# Patient Record
Sex: Male | Born: 1955 | Race: Black or African American | Hispanic: No | Marital: Married | Smoking: Former smoker
Health system: Southern US, Community
[De-identification: ages and names within clinical notes are randomized; demographics above are authoritative.]

## PROBLEM LIST (undated history)

## (undated) DIAGNOSIS — T7840XA Allergy, unspecified, initial encounter: Secondary | ICD-10-CM

## (undated) DIAGNOSIS — E119 Type 2 diabetes mellitus without complications: Secondary | ICD-10-CM

## (undated) DIAGNOSIS — K635 Polyp of colon: Secondary | ICD-10-CM

## (undated) DIAGNOSIS — I10 Essential (primary) hypertension: Secondary | ICD-10-CM

## (undated) DIAGNOSIS — M109 Gout, unspecified: Secondary | ICD-10-CM

## (undated) DIAGNOSIS — E785 Hyperlipidemia, unspecified: Secondary | ICD-10-CM

## (undated) HISTORY — DX: Polyp of colon: K63.5

## (undated) HISTORY — DX: Allergy, unspecified, initial encounter: T78.40XA

## (undated) HISTORY — DX: Essential (primary) hypertension: I10

## (undated) HISTORY — DX: Gout, unspecified: M10.9

## (undated) HISTORY — PX: COLONOSCOPY W/ POLYPECTOMY: SHX1380

## (undated) HISTORY — DX: Type 2 diabetes mellitus without complications: E11.9

## (undated) HISTORY — DX: Hyperlipidemia, unspecified: E78.5

## (undated) HISTORY — PX: NO PAST SURGERIES: SHX2092

---

## 2016-12-16 LAB — HM HIV SCREENING LAB: HM HIV Screening: NEGATIVE

## 2018-02-03 LAB — HM COLONOSCOPY

## 2018-08-26 LAB — HEPATIC FUNCTION PANEL
ALT: 78 — AB (ref 10–40)
AST: 108 — AB (ref 14–40)
Alkaline Phosphatase: 118 (ref 25–125)
Bilirubin, Total: 7.8

## 2018-08-26 LAB — BASIC METABOLIC PANEL
BUN: 11 (ref 4–21)
Creatinine: 0.8 (ref 0.6–1.3)
Glucose: 119
Potassium: 4.5 (ref 3.4–5.3)
Sodium: 140 (ref 137–147)

## 2018-08-26 LAB — CBC AND DIFFERENTIAL
HCT: 39 — AB (ref 41–53)
Hemoglobin: 12.7 — AB (ref 13.5–17.5)
Platelets: 230 (ref 150–399)
WBC: 6.8

## 2018-09-21 LAB — LIPID PANEL
Cholesterol: 184 (ref 0–200)
HDL: 33 — AB (ref 35–70)
LDL Cholesterol: 121
LDl/HDL Ratio: 3.7
Triglycerides: 148 (ref 40–160)

## 2018-09-21 LAB — HEPATIC FUNCTION PANEL
ALT: 57 — AB (ref 10–40)
AST: 123 — AB (ref 14–40)
Alkaline Phosphatase: 100 (ref 25–125)
Bilirubin, Total: 0.5

## 2018-09-21 LAB — HEMOGLOBIN A1C: Hemoglobin A1C: 5.6

## 2018-09-21 LAB — BASIC METABOLIC PANEL
BUN: 8 (ref 4–21)
Creatinine: 0.9 (ref 0.6–1.3)
Glucose: 132
Potassium: 4.2 (ref 3.4–5.3)
Sodium: 143 (ref 137–147)

## 2018-09-21 LAB — HM HEPATITIS C SCREENING LAB: HM Hepatitis Screen: NEGATIVE

## 2019-02-09 ENCOUNTER — Other Ambulatory Visit: Payer: Self-pay

## 2019-02-09 ENCOUNTER — Encounter: Payer: Self-pay | Admitting: Family Medicine

## 2019-02-09 ENCOUNTER — Ambulatory Visit (INDEPENDENT_AMBULATORY_CARE_PROVIDER_SITE_OTHER): Payer: Medicare HMO | Admitting: Family Medicine

## 2019-02-09 ENCOUNTER — Telehealth: Payer: Self-pay | Admitting: Family Medicine

## 2019-02-09 VITALS — BP 108/76 | HR 98 | Temp 98.3°F | Resp 18 | Ht 70.5 in | Wt 232.8 lb

## 2019-02-09 DIAGNOSIS — I10 Essential (primary) hypertension: Secondary | ICD-10-CM

## 2019-02-09 DIAGNOSIS — M1A0790 Idiopathic chronic gout, unspecified ankle and foot, without tophus (tophi): Secondary | ICD-10-CM | POA: Diagnosis not present

## 2019-02-09 DIAGNOSIS — Z9109 Other allergy status, other than to drugs and biological substances: Secondary | ICD-10-CM

## 2019-02-09 DIAGNOSIS — L602 Onychogryphosis: Secondary | ICD-10-CM | POA: Diagnosis not present

## 2019-02-09 DIAGNOSIS — M109 Gout, unspecified: Secondary | ICD-10-CM | POA: Insufficient documentation

## 2019-02-09 DIAGNOSIS — E785 Hyperlipidemia, unspecified: Secondary | ICD-10-CM | POA: Diagnosis not present

## 2019-02-09 DIAGNOSIS — K635 Polyp of colon: Secondary | ICD-10-CM | POA: Diagnosis not present

## 2019-02-09 DIAGNOSIS — R7303 Prediabetes: Secondary | ICD-10-CM | POA: Diagnosis not present

## 2019-02-09 DIAGNOSIS — E1169 Type 2 diabetes mellitus with other specified complication: Secondary | ICD-10-CM | POA: Diagnosis not present

## 2019-02-09 DIAGNOSIS — K219 Gastro-esophageal reflux disease without esophagitis: Secondary | ICD-10-CM | POA: Insufficient documentation

## 2019-02-09 NOTE — Telephone Encounter (Signed)
Patient was seen today. [employer] will be sending a fax about transferring his medications.  Patient said he needs to change from Col[place]icine.  Patient said it would cost him $382 per month.

## 2019-02-09 NOTE — Assessment & Plan Note (Signed)
Stable, refill

## 2019-02-09 NOTE — Telephone Encounter (Signed)
Given that he has been stable x 2 years w/o a flare, would recommend trial of stopping the colchicine -- not meant to be a long term medication

## 2019-02-09 NOTE — Assessment & Plan Note (Signed)
BP at goal. Cont current medication

## 2019-02-09 NOTE — Assessment & Plan Note (Addendum)
Has been a few years since last flare. Rheumatology just cut back colchicine and celecoxib. Now colchicine is very expense. Given duration of time w/o flare, likely does not need continued prophylaxis. Will stop colchicine and monitor for flare. Uric acid per care everywhere has been normal

## 2019-02-09 NOTE — Assessment & Plan Note (Signed)
Doing well on metformin. Will f/u labs

## 2019-02-09 NOTE — Progress Notes (Signed)
Subjective:     Tyler Cubit is a 63 y.o. male presenting for Establish Care (previous PCP was [facility] in [place]) and Medication Refill     HPI  #Toe issue - has long toenails and difficulty cutting them on his own - worried about diabetes - would like to have podiatry referral  #Hx of colon polyps - told to f/u with GI in 12-18 months - last appointment was summer 2019  #HLD - severe side effects to statin - cannot tolerate  #HTN - taking medication w/o issues  #Gout  - recently decreased colchicine and celecoxib - taking allopurinol - sees rheumatology - but will need an in-network provider   Review of Systems  Constitutional: Negative for chills and fever.  Respiratory: Negative for shortness of breath.   Cardiovascular: Negative for chest pain.  Gastrointestinal: Negative for blood in stool.     Social History   Tobacco Use  Smoking Status Former Smoker  . Packs/day: 0.50  . Years: 25.00  . Pack years: 12.50  . Types: Cigarettes  . Quit date: 71  . Years since quitting: 30.5  Smokeless Tobacco Never Used        Objective:    BP Readings from Last 3 Encounters:  02/09/19 108/76   Wt Readings from Last 3 Encounters:  02/09/19 232 lb 12 oz (105.6 kg)    BP 108/76   Pulse 98   Temp 98.3 F (36.8 C)   Resp 18   Ht 5' 10.5" (1.791 m)   Wt 232 lb 12 oz (105.6 kg)   SpO2 99%   BMI 32.92 kg/m    Physical Exam Constitutional:      Appearance: Normal appearance. He is not ill-appearing or diaphoretic.  HENT:     Right Ear: External ear normal.     Left Ear: External ear normal.     Nose: Nose normal.  Eyes:     General: No scleral icterus.    Extraocular Movements: Extraocular movements intact.     Conjunctiva/sclera: Conjunctivae normal.  Neck:     Musculoskeletal: Neck supple.  Cardiovascular:     Rate and Rhythm: Normal rate and regular rhythm.     Heart sounds: No murmur.  Pulmonary:     Effort:  Pulmonary effort is normal. No respiratory distress.     Breath sounds: Normal breath sounds. No wheezing.  Skin:    General: Skin is warm and dry.  Neurological:     Mental Status: He is alert. Mental status is at baseline.  Psychiatric:        Mood and Affect: Mood normal.           Assessment & Plan:   Problem List Items Addressed This Visit      Cardiovascular and Mediastinum   Essential hypertension - Primary    BP at goal. Cont current medication      Relevant Medications   amLODipine (NORVASC) 10 MG tablet   lisinopril (ZESTRIL) 40 MG tablet     Digestive   GERD (gastroesophageal reflux disease)    Stable, refill       Relevant Medications   omeprazole (PRILOSEC) 20 MG capsule     Endocrine   Hyperlipidemia associated with type 2 diabetes mellitus (HCC)   Relevant Medications   metFORMIN (GLUCOPHAGE) 500 MG tablet   lisinopril (ZESTRIL) 40 MG tablet     Other   Prediabetes    Doing well on metformin. Will f/u labs  Gout    Has been a few years since last flare. Rheumatology just cut back colchicine and celecoxib. Now colchicine is very expense. Given duration of time w/o flare, likely does not need continued prophylaxis. Will stop colchicine and monitor for flare. Uric acid per care everywhere has been normal      Environmental allergies    Other Visit Diagnoses    Polyp of colon, unspecified part of colon, unspecified type       Relevant Orders   Ambulatory referral to Gastroenterology   Overgrown toenails       Relevant Orders   Ambulatory referral to Podiatry       Return in about 6 months (around 08/12/2019).  Lesleigh Noe, MD

## 2019-02-09 NOTE — Assessment & Plan Note (Signed)
Cannot tolerate statin. Will encourage diet/exercise

## 2019-02-09 NOTE — Telephone Encounter (Signed)
Awaiting information from [facility]. Sending request for change of medication to Dr. Einar Pheasant to review

## 2019-02-10 NOTE — Telephone Encounter (Signed)
Patient advised. Patient will call back if he starts to having any issues.

## 2019-02-17 ENCOUNTER — Other Ambulatory Visit: Payer: Self-pay | Admitting: Family Medicine

## 2019-02-17 ENCOUNTER — Telehealth: Payer: Self-pay | Admitting: Family Medicine

## 2019-02-17 MED ORDER — OMEPRAZOLE 20 MG PO CPDR: 20.0000 mg | DELAYED_RELEASE_CAPSULE | Freq: Every day | ORAL | 1 refills | Status: DC

## 2019-02-17 MED ORDER — LISINOPRIL 40 MG PO TABS: 40.0000 mg | ORAL_TABLET | Freq: Every day | ORAL | 1 refills | Status: DC

## 2019-02-17 MED ORDER — ALLOPURINOL 100 MG PO TABS: 100.0000 mg | ORAL_TABLET | Freq: Every day | ORAL | 1 refills | Status: DC

## 2019-02-17 MED ORDER — METFORMIN HCL 500 MG PO TABS: 500.0000 mg | ORAL_TABLET | Freq: Two times a day (BID) | ORAL | 1 refills | Status: DC

## 2019-02-17 MED ORDER — AMLODIPINE BESYLATE 10 MG PO TABS: 10.0000 mg | ORAL_TABLET | Freq: Every day | ORAL | 1 refills | Status: DC

## 2019-02-17 NOTE — Telephone Encounter (Signed)
RXs sent in except for Celebrex, will let Dr. Einar Pheasant review.

## 2019-02-17 NOTE — Telephone Encounter (Signed)
Pt is requesting all meds on list (these were confirmed) sent to [facility] mail order pharmacy.

## 2019-02-17 NOTE — Telephone Encounter (Signed)
Pt called to let Rosaria Ferries know that he was able to call and get colonoscopy scheduled for 8/6.

## 2019-02-18 ENCOUNTER — Other Ambulatory Visit: Payer: Self-pay

## 2019-02-18 ENCOUNTER — Encounter: Payer: Self-pay | Admitting: Podiatry

## 2019-02-18 ENCOUNTER — Ambulatory Visit: Payer: Medicare HMO | Admitting: Podiatry

## 2019-02-18 DIAGNOSIS — M79674 Pain in right toe(s): Secondary | ICD-10-CM | POA: Diagnosis not present

## 2019-02-18 DIAGNOSIS — M79675 Pain in left toe(s): Secondary | ICD-10-CM

## 2019-02-18 DIAGNOSIS — B351 Tinea unguium: Secondary | ICD-10-CM | POA: Diagnosis not present

## 2019-02-18 MED ORDER — CELECOXIB 200 MG PO CAPS: 200.0000 mg | ORAL_CAPSULE | Freq: Every day | ORAL | 1 refills | Status: DC

## 2019-02-18 NOTE — Progress Notes (Signed)
Complaint:  Visit Type: Patient presents  to my office for  preventative foot care services. Complaint: Patient states" my nails have grown long and thick and become painful to walk and wear shoes" Patient has been diagnosed prediabetic. The patient presents for preventative foot care services. No changes to ROS  Podiatric Exam: Vascular: dorsalis pedis and posterior tibial pulses are palpable bilateral. Capillary return is immediate. Temperature gradient is WNL. Skin turgor WNL  Sensorium: Normal Semmes Weinstein monofilament test. Normal tactile sensation bilaterally. Nail Exam: Pt has thick disfigured discolored nails with subungual debris noted bilateral entire nail hallux through fifth toenails Ulcer Exam: There is no evidence of ulcer or pre-ulcerative changes or infection. Orthopedic Exam: Muscle tone and strength are WNL. No limitations in general ROM. No crepitus or effusions noted. Foot type and digits show no abnormalities. Bony prominences are unremarkable. Skin: No Porokeratosis. No infection or ulcers  Diagnosis:  Onychomycosis, , Pain in right toe, pain in left toes  Treatment & Plan Procedures and Treatment: Consent by patient was obtained for treatment procedures.   Debridement of mycotic and hypertrophic toenails, 1 through 5 bilateral and clearing of subungual debris. No ulceration, no infection noted.  Return Visit-Office Procedure: Patient instructed to return to the office for a follow up visit 3 months for continued evaluation and treatment.    Gardiner Barefoot DPM

## 2019-02-24 DIAGNOSIS — E119 Type 2 diabetes mellitus without complications: Secondary | ICD-10-CM | POA: Diagnosis not present

## 2019-02-24 DIAGNOSIS — M1000 Idiopathic gout, unspecified site: Secondary | ICD-10-CM | POA: Diagnosis not present

## 2019-02-24 DIAGNOSIS — R945 Abnormal results of liver function studies: Secondary | ICD-10-CM | POA: Diagnosis not present

## 2019-02-24 DIAGNOSIS — I10 Essential (primary) hypertension: Secondary | ICD-10-CM | POA: Diagnosis not present

## 2019-03-08 DIAGNOSIS — Z01812 Encounter for preprocedural laboratory examination: Secondary | ICD-10-CM | POA: Diagnosis not present

## 2019-03-08 DIAGNOSIS — Z1159 Encounter for screening for other viral diseases: Secondary | ICD-10-CM | POA: Diagnosis not present

## 2019-03-08 DIAGNOSIS — R945 Abnormal results of liver function studies: Secondary | ICD-10-CM | POA: Diagnosis not present

## 2019-03-08 DIAGNOSIS — M1000 Idiopathic gout, unspecified site: Secondary | ICD-10-CM | POA: Diagnosis not present

## 2019-03-11 DIAGNOSIS — Z86010 Personal history of colonic polyps: Secondary | ICD-10-CM | POA: Diagnosis not present

## 2019-03-11 DIAGNOSIS — Z7984 Long term (current) use of oral hypoglycemic drugs: Secondary | ICD-10-CM | POA: Diagnosis not present

## 2019-03-11 DIAGNOSIS — Z79899 Other long term (current) drug therapy: Secondary | ICD-10-CM | POA: Diagnosis not present

## 2019-03-11 DIAGNOSIS — I10 Essential (primary) hypertension: Secondary | ICD-10-CM | POA: Diagnosis not present

## 2019-03-11 DIAGNOSIS — K635 Polyp of colon: Secondary | ICD-10-CM | POA: Diagnosis not present

## 2019-03-11 DIAGNOSIS — K640 First degree hemorrhoids: Secondary | ICD-10-CM | POA: Diagnosis not present

## 2019-03-11 DIAGNOSIS — R069 Unspecified abnormalities of breathing: Secondary | ICD-10-CM | POA: Diagnosis not present

## 2019-03-11 DIAGNOSIS — D125 Benign neoplasm of sigmoid colon: Secondary | ICD-10-CM | POA: Diagnosis not present

## 2019-03-11 DIAGNOSIS — Z9889 Other specified postprocedural states: Secondary | ICD-10-CM | POA: Diagnosis not present

## 2019-03-11 DIAGNOSIS — E119 Type 2 diabetes mellitus without complications: Secondary | ICD-10-CM | POA: Diagnosis not present

## 2019-03-11 DIAGNOSIS — Z1211 Encounter for screening for malignant neoplasm of colon: Secondary | ICD-10-CM | POA: Diagnosis not present

## 2019-03-11 DIAGNOSIS — K648 Other hemorrhoids: Secondary | ICD-10-CM | POA: Diagnosis not present

## 2019-04-23 ENCOUNTER — Telehealth: Payer: Self-pay

## 2019-04-23 MED ORDER — CETIRIZINE HCL 10 MG PO TABS: 10.0000 mg | ORAL_TABLET | Freq: Every day | ORAL | 1 refills | Status: DC

## 2019-04-23 NOTE — Telephone Encounter (Signed)
Pt established care with Dr Einar Pheasant on 02/09/19 and pt said [employer] does not carry Cetirizine. Pt request 90 day supply Cetirizine taking one daily to [facility]. Pt said OTC med is $10.00 more a month than rx med. Refilled cetirizine to [facility] per protocol and pt will cb to schedule 6 mth fu. Anastasiya CMA said OK to refill by Dr Einar Pheasant.

## 2019-05-04 ENCOUNTER — Other Ambulatory Visit: Payer: Self-pay

## 2019-05-04 ENCOUNTER — Encounter (HOSPITAL_COMMUNITY): Payer: Self-pay | Admitting: Emergency Medicine

## 2019-05-04 ENCOUNTER — Telehealth: Payer: Self-pay

## 2019-05-04 ENCOUNTER — Emergency Department (HOSPITAL_COMMUNITY)
Admission: EM | Admit: 2019-05-04 | Discharge: 2019-05-04 | Disposition: A | Discharge status: Home / Self Care | Payer: Medicare HMO | Attending: Emergency Medicine | Admitting: Emergency Medicine

## 2019-05-04 DIAGNOSIS — E119 Type 2 diabetes mellitus without complications: Secondary | ICD-10-CM | POA: Diagnosis not present

## 2019-05-04 DIAGNOSIS — Z7984 Long term (current) use of oral hypoglycemic drugs: Secondary | ICD-10-CM | POA: Insufficient documentation

## 2019-05-04 DIAGNOSIS — Z87891 Personal history of nicotine dependence: Secondary | ICD-10-CM | POA: Insufficient documentation

## 2019-05-04 DIAGNOSIS — I10 Essential (primary) hypertension: Secondary | ICD-10-CM | POA: Diagnosis not present

## 2019-05-04 DIAGNOSIS — K148 Other diseases of tongue: Secondary | ICD-10-CM | POA: Diagnosis present

## 2019-05-04 DIAGNOSIS — T783XXA Angioneurotic edema, initial encounter: Secondary | ICD-10-CM | POA: Insufficient documentation

## 2019-05-04 DIAGNOSIS — Z79899 Other long term (current) drug therapy: Secondary | ICD-10-CM | POA: Diagnosis not present

## 2019-05-04 LAB — CBC WITH DIFFERENTIAL/PLATELET
Abs Immature Granulocytes: 0.02 10*3/uL (ref 0.00–0.07)
Basophils Absolute: 0 10*3/uL (ref 0.0–0.1)
Basophils Relative: 0 %
Eosinophils Absolute: 0.1 10*3/uL (ref 0.0–0.5)
Eosinophils Relative: 1 %
HCT: 37.1 % — ABNORMAL LOW (ref 39.0–52.0)
Hemoglobin: 12.4 g/dL — ABNORMAL LOW (ref 13.0–17.0)
Immature Granulocytes: 0 %
Lymphocytes Relative: 23 %
Lymphs Abs: 1.3 10*3/uL (ref 0.7–4.0)
MCH: 30.8 pg (ref 26.0–34.0)
MCHC: 33.4 g/dL (ref 30.0–36.0)
MCV: 92.1 fL (ref 80.0–100.0)
Monocytes Absolute: 0.5 10*3/uL (ref 0.1–1.0)
Monocytes Relative: 8 %
Neutro Abs: 3.7 10*3/uL (ref 1.7–7.7)
Neutrophils Relative %: 68 %
Platelets: 212 10*3/uL (ref 150–400)
RBC: 4.03 MIL/uL — ABNORMAL LOW (ref 4.22–5.81)
RDW: 14.5 % (ref 11.5–15.5)
WBC: 5.6 10*3/uL (ref 4.0–10.5)
nRBC: 0 % (ref 0.0–0.2)

## 2019-05-04 LAB — BASIC METABOLIC PANEL
Anion gap: 10 (ref 5–15)
BUN: 9 mg/dL (ref 8–23)
CO2: 24 mmol/L (ref 22–32)
Calcium: 9.4 mg/dL (ref 8.9–10.3)
Chloride: 103 mmol/L (ref 98–111)
Creatinine, Ser: 0.92 mg/dL (ref 0.61–1.24)
GFR calc Af Amer: >60 mL/min (ref >60)
GFR calc non Af Amer: >60 mL/min (ref >60)
Glucose, Bld: 179 mg/dL — ABNORMAL HIGH (ref 70–99)
Potassium: 4.1 mmol/L (ref 3.5–5.1)
Sodium: 137 mmol/L (ref 135–145)

## 2019-05-04 MED ORDER — METHYLPREDNISOLONE SODIUM SUCC 125 MG IJ SOLR
125.0000 mg | Freq: Once | INTRAMUSCULAR | Status: AC
  Administered 2019-05-04: 125 mg via INTRAVENOUS
  Filled 2019-05-04: qty 2

## 2019-05-04 NOTE — ED Provider Notes (Signed)
[facility] EMERGENCY DEPARTMENT Provider Note   CSN: [id number] Arrival date & time: 05/04/19  [id number]     History   Chief Complaint Chief Complaint  Patient presents with  . Angioedema    HPI Jeremy Andrews is a 63 y.o. male.     HPI  Pt is a 63 y/o male with a h/o DM, gout, HTN, who presents to the ED today for eval of tongue swelling. States he woke up this morning around 7:30AM with right sided tongue swelling. He has had some progression to the left side of his tongue. He feels like it is hard to swallow but he is able to tolerate his secretions.   He denies rashes, sob, wheezing or other symptoms. He denies any new meds, foods, etc.   He is on lisinopril and has been for years.    Past Medical History:  Diagnosis Date  . Allergy   . Colon polyps   . Diabetes mellitus without complication ([place])   . Gout   . Hypertension     Patient Active Problem List   Diagnosis Date Noted  . Pain due to onychomycosis of toenails of both feet 02/18/2019  . Essential hypertension 02/09/2019  . Prediabetes 02/09/2019  . Hyperlipidemia associated with type 2 diabetes mellitus ([place]) 02/09/2019  . GERD (gastroesophageal reflux disease) 02/09/2019  . Gout 02/09/2019  . Environmental allergies 02/09/2019    Past Surgical History:  Procedure Laterality Date  . NO PAST SURGERIES          Home Medications    Prior to Admission medications   Medication Sig Start Date End Date Taking? Authorizing Provider  allopurinol (ZYLOPRIM) 100 MG tablet Take 1 tablet (100 mg total) by mouth daily. 02/17/19  Yes Lesleigh Noe, MD  amLODipine (NORVASC) 10 MG tablet Take 1 tablet (10 mg total) by mouth daily. 02/17/19  Yes Lesleigh Noe, MD  celecoxib (CELEBREX) 200 MG capsule Take 1 capsule (200 mg total) by mouth daily. 02/18/19  Yes Lesleigh Noe, MD  cetirizine (ZYRTEC) 10 MG tablet Take 1 tablet (10 mg total) by mouth daily. 04/23/19  Yes Lesleigh Noe, MD   lisinopril (ZESTRIL) 40 MG tablet Take 1 tablet (40 mg total) by mouth daily. 02/17/19  Yes Lesleigh Noe, MD  metFORMIN (GLUCOPHAGE) 500 MG tablet Take 1 tablet (500 mg total) by mouth 2 (two) times daily with a meal. 02/17/19  Yes Lesleigh Noe, MD  omeprazole (PRILOSEC) 20 MG capsule Take 1 capsule (20 mg total) by mouth daily. 02/17/19  Yes Lesleigh Noe, MD    Family History Family History  Problem Relation Age of Onset  . Hypertension Mother   . Gout Mother   . Other Mother        meningioma  . Hypertension Father   . Kidney disease Sister   . Hypertension Sister   . Kidney failure Sister   . Other Sister        legally blind  . Diabetes Sister   . Arthritis Brother   . Gout Brother   . Breast cancer Sister   . Breast cancer Maternal Grandmother 57    Social History Social History   Tobacco Use  . Smoking status: Former Smoker    Packs/day: 0.50    Years: 25.00    Pack years: 12.50    Types: Cigarettes    Quit date: 1990    Years since quitting: 30.7  . Smokeless tobacco: Never Used  Substance Use Topics  . Alcohol use: Yes    Comment: drinks 2-3 days a week, will drink 2-4 each day  . Drug use: Not Currently    Types: Marijuana    Comment: last time around 2000 or even before then     Allergies   Patient has no known allergies.   Review of Systems Review of Systems  Constitutional: Negative for chills and fever.  HENT: Positive for facial swelling. Negative for ear pain and sore throat.   Eyes: Negative for visual disturbance.  Respiratory: Negative for cough and shortness of breath.   Cardiovascular: Negative for chest pain and palpitations.  Gastrointestinal: Negative for abdominal pain, constipation, diarrhea, nausea and vomiting.  Genitourinary: Negative for dysuria and hematuria.  Musculoskeletal: Negative for back pain.  Skin: Negative for rash.  Neurological: Negative for headaches.  All other systems reviewed and are negative.     Physical Exam Updated Vital Signs BP 122/75   Pulse 85   Temp 98.4 F (36.9 C) (Oral)   Resp 18   SpO2 99%   Physical Exam Vitals signs and nursing note reviewed.  Constitutional:      General: He is not in acute distress.    Appearance: He is well-developed. He is not ill-appearing.  HENT:     Head: Normocephalic and atraumatic.     Mouth/Throat:     Mouth: Mucous membranes are moist.     Comments: Swelling to the tongue. No lip swelling. Speech somewhat garbled. Tolerating secretions.  Eyes:     Conjunctiva/sclera: Conjunctivae normal.  Neck:     Musculoskeletal: Neck supple.  Cardiovascular:     Rate and Rhythm: Normal rate and regular rhythm.     Heart sounds: Normal heart sounds. No murmur.  Pulmonary:     Effort: Pulmonary effort is normal. No respiratory distress.     Breath sounds: Normal breath sounds. No wheezing, rhonchi or rales.  Abdominal:     Palpations: Abdomen is soft.     Tenderness: There is no abdominal tenderness.  Skin:    General: Skin is warm and dry.  Neurological:     Mental Status: He is alert.      ED Treatments / Results  Labs (all labs ordered are listed, but only abnormal results are displayed) Labs Reviewed  CBC WITH DIFFERENTIAL/PLATELET - Abnormal; Notable for the following components:      Result Value   RBC 4.03 (*)    Hemoglobin 12.4 (*)    HCT 37.1 (*)    All other components within normal limits  BASIC METABOLIC PANEL - Abnormal; Notable for the following components:   Glucose, Bld 179 (*)    All other components within normal limits    EKG None  Radiology No results found.  Procedures Procedures (including critical care time)  Medications Ordered in ED Medications  methylPREDNISolone sodium succinate (SOLU-MEDROL) 125 mg/2 mL injection 125 mg (125 mg Intravenous Given 05/04/19 1018)     Initial Impression / Assessment and Plan / ED Course  I have reviewed the triage vital signs and the nursing notes.   Pertinent labs & imaging results that were available during my care of the patient were reviewed by me and considered in my medical decision making (see chart for details).   Final Clinical Impressions(s) / ED Diagnoses   Final diagnoses:  Angioedema, initial encounter   Pt is a 63 y/o male presenting for eval of angioedema that started when he woke up this AM around  7:30AM. He is on lisinopril. Denies sob, wheezing, hives. No new meds, foods, environmental changes.  On exam, he has diffuse tongue swelling. No lip swelling. Speech somewhat garbled. He is tolerating secretions. No wheezing on exam. No rashes.  Suspect angioedema 2/2 lisinopril. Lower suspicion for anaphylaxis. Will give solumedrol, obtain basic labs and monitor closely.  - labs reviewed and at baseline for pt.   10:39 AM Reassessed pt. He is sitting comfortably in bed. He states he has had some improvement of sxs. He states that the right side of his tongue feels less swollen and he is swallowing and talking better than when he first arrived to the ED. He does feel like the left side of his tongue is more swollen thought. On exam he still exhibits diffuse tongue swelling. No lip swelling. He tolerating his secretions.   11:40 AM Reassessed pt. He is sitting comfortably in bed. He reports no change in tongue swelling. He does feel like it is easier to swallow. His speech is more clear than prior.   12:45 PM Pt still appears well. He "feels a whole lot better". He does still have swelling of his tongue. Airway patent.   1:39 PM Pt feels better. Voice is improving. Tongue swelling remains stable.   3:13 PM Pt improving. He has remained stable. Discussed plan for discharge and plan to discontinue lisinopril. I suspect sxs secondary to this. Advised him to f/u with pcp in regards to further BP medications recommendations. Advised to return immediately if worse.  He and his wife at bedside voiced understanding of the plan and are in  agreement.  They are comfortable with this plan.  All questions answered.  Patient stable for discharge.  Pt seen in conjunction with Dr. Alvino Chapel who personally evaluated pt and is in agreement with plan.   ED Discharge Orders    None       Bishop Dublin 05/04/19 1516    Davonna Belling, MD 05/04/19 [phone]

## 2019-05-04 NOTE — Discharge Instructions (Signed)
Stop taking your lisinopril.  Call your regular doctor in regards to starting a new blood pressure medication to replace this.  Please make an appointment in the next few days to be seen.  Return to the emergency department immediately if any of your symptoms progress.

## 2019-05-04 NOTE — ED Notes (Signed)
Pt has swelling in tongue, takes lisinopril and metformin.

## 2019-05-04 NOTE — ED Triage Notes (Signed)
Pt states he woke up with his right side of his tongue very swollen with difficulty swallowing. Pt denies any trouble breathing at this time and can speak in compete sentences however it is somewhat garbled due to swelling.   Charge made aware pt needs room stat.

## 2019-05-04 NOTE — ED Notes (Signed)
Patient Alert and oriented to baseline. Stable and ambulatory to baseline. Patient verbalized understanding of the discharge instructions.  Patient belongings were taken by the patient.   

## 2019-05-04 NOTE — Telephone Encounter (Signed)
Noted.. agree with ER visit.

## 2019-05-04 NOTE — Telephone Encounter (Addendum)
Pt has swollen tongue and difficulty swallowing that started during the night; pt is not having difficulty breathing and cannot tell if throat is swollen or not due to difficulty swallowing. Throat is not sore. Pt has had nothing new, no new foods,drinks, clothing, furniture,meds etc. Pt speech is somewhat difficult now. Pt does not want 911 called and pt has someone who can take him to [facility] ED now.DR Einar Pheasant pt will FYI Dr Heather Roberts chart review pt is at [facility] ED)

## 2019-05-05 ENCOUNTER — Other Ambulatory Visit: Payer: Self-pay

## 2019-05-05 ENCOUNTER — Ambulatory Visit (INDEPENDENT_AMBULATORY_CARE_PROVIDER_SITE_OTHER): Payer: Medicare HMO | Admitting: Internal Medicine

## 2019-05-05 ENCOUNTER — Encounter: Payer: Self-pay | Admitting: Internal Medicine

## 2019-05-05 VITALS — BP 142/78 | HR 111 | Temp 97.9°F | Wt 242.0 lb

## 2019-05-05 DIAGNOSIS — T783XXD Angioneurotic edema, subsequent encounter: Secondary | ICD-10-CM

## 2019-05-05 DIAGNOSIS — I10 Essential (primary) hypertension: Secondary | ICD-10-CM | POA: Diagnosis not present

## 2019-05-05 MED ORDER — LABETALOL HCL 100 MG PO TABS: 50.0000 mg | ORAL_TABLET | Freq: Two times a day (BID) | ORAL | 0 refills | Status: DC

## 2019-05-05 NOTE — Patient Instructions (Signed)
Angioedema  Angioedema is sudden swelling in the body. The swelling can happen in any part of the body. It often happens on the skin and causes itchy, bumpy patches (hives) to form. This condition may:  Happen only one time.  Happen more than one time. It may come back at random times.  Keep coming back for a number of years. Someday it may stop coming back. Follow these instructions at home:  Take over-the-counter and prescription medicines only as told by your doctor.  If you were given medicines for emergency allergy treatment, always carry them with you.  Wear a medical bracelet as told by your doctor.  Avoid the things that cause your attacks (triggers).  If this condition was passed to you from your parents and you want to have kids, talk to your doctor. Your kids may also have this condition. Contact a doctor if:  You have another attack.  Your attacks happen more often, even after you take steps to prevent them.  This condition was passed to you by your parents and you want to have kids. Get help right away if:  Your mouth, tongue, or lips get very swollen.  You have trouble breathing.  You have trouble swallowing.  You pass out (faint). This information is not intended to replace advice given to you by your health care provider. Make sure you discuss any questions you have with your health care provider. Document Released: 07/10/2009 Document Revised: 07/04/2017 Document Reviewed: 01/30/2016 Elsevier Patient Education  2020 [employer].

## 2019-05-05 NOTE — Progress Notes (Signed)
Subjective:    Patient ID: Jeremy Andrews, male    DOB: 08/22/1955, 63 y.o.   MRN: [id number]  HPI  Pt presents to the clinic today for ER follow up. He went to the ER 9/29 with c/o tongue swelling. ECG did not show any acute findings. Labs were unremarkable. He was given Solumedrol 125 mg. He was diagnosed with angioedema secondary to Lisinopril. They advised him not to take this any longer. He was discharged home and advised to follow up with his PCP. Since discharge, he reports swelling of the tongue has almost completely resolved. He has not taken any Lisinopril but has been taking his Amlodipine. His BP today is 142/78.  Review of Systems  Past Medical History:  Diagnosis Date  . Allergy   . Colon polyps   . Diabetes mellitus without complication ([place])   . Gout   . Hypertension     Current Outpatient Medications  Medication Sig Dispense Refill  . allopurinol (ZYLOPRIM) 100 MG tablet Take 1 tablet (100 mg total) by mouth daily. 90 tablet 1  . amLODipine (NORVASC) 10 MG tablet Take 1 tablet (10 mg total) by mouth daily. 90 tablet 1  . celecoxib (CELEBREX) 200 MG capsule Take 1 capsule (200 mg total) by mouth daily. 90 capsule 1  . cetirizine (ZYRTEC) 10 MG tablet Take 1 tablet (10 mg total) by mouth daily. 90 tablet 1  . metFORMIN (GLUCOPHAGE) 500 MG tablet Take 1 tablet (500 mg total) by mouth 2 (two) times daily with a meal. 180 tablet 1  . omeprazole (PRILOSEC) 20 MG capsule Take 1 capsule (20 mg total) by mouth daily. 90 capsule 1   No current facility-administered medications for this visit.     Allergies  Allergen Reactions  . Lisinopril Swelling    Family History  Problem Relation Age of Onset  . Hypertension Mother   . Gout Mother   . Other Mother        meningioma  . Hypertension Father   . Kidney disease Sister   . Hypertension Sister   . Kidney failure Sister   . Other Sister        legally blind  . Diabetes Sister   . Arthritis Brother   . Gout Brother    . Breast cancer Sister   . Breast cancer Maternal Grandmother 60    Social History   Socioeconomic History  . Marital status: Legally Separated    Spouse name: Not on file  . Number of children: 1  . Years of education: 2 years of college  . Highest education level: Not on file  Occupational History  . Not on file  Social Needs  . Financial resource strain: Not hard at all  . Food insecurity    Worry: Not on file    Inability: Not on file  . Transportation needs    Medical: Not on file    Non-medical: Not on file  Tobacco Use  . Smoking status: Former Smoker    Packs/day: 0.50    Years: 25.00    Pack years: 12.50    Types: Cigarettes    Quit date: 1990    Years since quitting: 30.7  . Smokeless tobacco: Never Used  Substance and Sexual Activity  . Alcohol use: Yes    Comment: drinks 2-3 days a week, will drink 2-4 each day  . Drug use: Not Currently    Types: Marijuana    Comment: last time around 2000 or even before  then  . Sexual activity: Yes    Birth control/protection: None, Post-menopausal  Lifestyle  . Physical activity    Days per week: Not on file    Minutes per session: Not on file  . Stress: Not on file  Relationships  . Social Herbalist on phone: Not on file    Gets together: Not on file    Attends religious service: Not on file    Active member of club or organization: Not on file    Attends meetings of clubs or organizations: Not on file    Relationship status: Not on file  . Intimate partner violence    Fear of current or ex partner: Not on file    Emotionally abused: Not on file    Physically abused: Not on file    Forced sexual activity: Not on file  Other Topics Concern  . 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  HEENT: Denies eye pain, eye redness, ear pain, ringing in the ears, wax buildup, runny nose, nasal congestion, bloody nose, or sore throat. Respiratory: Denies difficulty breathing, shortness of breath, cough or sputum production.   Cardiovascular: Denies chest pain, chest tightness, palpitations or swelling in the hands or feet.    No other specific complaints in a complete review of systems (except as listed in HPI above).     Objective:   Physical Exam   BP (!) 142/78   Pulse (!) 111   Temp 97.9 F (36.6 C) (Temporal)   Wt 242 lb (109.8 kg)   SpO2 98%   BMI 34.23 kg/m  Wt Readings from Last 3 Encounters:  05/05/19 242 lb (109.8 kg)  02/09/19 232 lb 12 oz (105.6 kg)    General: Appears his stated age, obese, in NAD. HEENT: Throat/Mouth: Teeth present, mucosa pink and moist, no exudate, lesions or ulcerations noted.  Cardiovascular: Tachycardic with normal rhythm. No murmur noted. Pulmonary/Chest: Normal effort and positive vesicular breath sounds. No respiratory distress. No wheezes, rales or ronchi noted.  Neurological: Alert and oriented.    BMET    Component Value Date/Time   NA 137 05/04/2019 1019   NA 143 09/21/2018   K 4.1 05/04/2019 1019   CL 103 05/04/2019 1019   CO2 24 05/04/2019 1019   GLUCOSE 179 (H) 05/04/2019 1019   BUN 9 05/04/2019 1019   BUN 8 09/21/2018   CREATININE 0.92 05/04/2019 1019   CALCIUM 9.4 05/04/2019 1019   GFRNONAA >60 05/04/2019 1019   GFRAA >60 05/04/2019 1019    Lipid Panel     Component Value Date/Time   CHOL 184 09/21/2018   TRIG 148 09/21/2018   HDL 33 (A)  09/21/2018   LDLCALC 121 09/21/2018    CBC  Component Value Date/Time   WBC 5.6 05/04/2019 1019   RBC 4.03 (L) 05/04/2019 1019   HGB 12.4 (L) 05/04/2019 1019   HCT 37.1 (L) 05/04/2019 1019   PLT 212 05/04/2019 1019   MCV 92.1 05/04/2019 1019   MCH 30.8 05/04/2019 1019   MCHC 33.4 05/04/2019 1019   RDW 14.5 05/04/2019 1019   LYMPHSABS 1.3 05/04/2019 1019   MONOABS 0.5 05/04/2019 1019   EOSABS 0.1 05/04/2019 1019   BASOSABS 0.0 05/04/2019 1019    Hgb A1C Lab Results  Component Value Date   HGBA1C 5.6 09/21/2018           Assessment & Plan:   ER Follow up for Angioedema, HTN:  ER notes, labs and imaging reviewed Angioedema resolved Continue Amlodipine Lisinopril d/c'd and added to allergy list Will trial Labetalol 50 mg BID Reinforced DASH diet and exercise for weight loss  RTC in 2 weeks for follow up HTN Webb Silversmith, NP

## 2019-05-19 ENCOUNTER — Ambulatory Visit (INDEPENDENT_AMBULATORY_CARE_PROVIDER_SITE_OTHER): Payer: Medicare HMO | Admitting: Internal Medicine

## 2019-05-19 ENCOUNTER — Other Ambulatory Visit: Payer: Self-pay

## 2019-05-19 ENCOUNTER — Encounter: Payer: Self-pay | Admitting: Internal Medicine

## 2019-05-19 DIAGNOSIS — I10 Essential (primary) hypertension: Secondary | ICD-10-CM | POA: Diagnosis not present

## 2019-05-19 NOTE — Patient Instructions (Signed)
Managing Your Hypertension Hypertension is commonly called high blood pressure. This is when the force of your blood pressing against the walls of your arteries is too strong. Arteries are blood vessels that carry blood from your heart throughout your body. Hypertension forces the heart to work harder to pump blood, and may cause the arteries to become narrow or stiff. Having untreated or uncontrolled hypertension can cause heart attack, stroke, kidney disease, and other problems. What are blood pressure readings? A blood pressure reading consists of a higher number over a lower number. Ideally, your blood pressure should be below 120/80. The first ("top") number is called the systolic pressure. It is a measure of the pressure in your arteries as your heart beats. The second ("bottom") number is called the diastolic pressure. It is a measure of the pressure in your arteries as the heart relaxes. What does my blood pressure reading mean? Blood pressure is classified into four stages. Based on your blood pressure reading, your health care provider may use the following stages to determine what type of treatment you need, if any. Systolic pressure and diastolic pressure are measured in a unit called mm Hg. Normal  Systolic pressure: below 120.  Diastolic pressure: below 80. Elevated  Systolic pressure: 120-129.  Diastolic pressure: below 80. Hypertension stage 1  Systolic pressure: 130-139.  Diastolic pressure: 80-89. Hypertension stage 2  Systolic pressure: 140 or above.  Diastolic pressure: 90 or above. What health risks are associated with hypertension? Managing your hypertension is an important responsibility. Uncontrolled hypertension can lead to:  A heart attack.  A stroke.  A weakened blood vessel (aneurysm).  Heart failure.  Kidney damage.  Eye damage.  Metabolic syndrome.  Memory and concentration problems. What changes can I make to manage my  hypertension? Hypertension can be managed by making lifestyle changes and possibly by taking medicines. Your health care provider will help you make a plan to bring your blood pressure within a normal range. Eating and drinking   Eat a diet that is high in fiber and potassium, and low in salt (sodium), added sugar, and fat. An example eating plan is called the DASH (Dietary Approaches to Stop Hypertension) diet. To eat this way: ? Eat plenty of fresh fruits and vegetables. Try to fill half of your plate at each meal with fruits and vegetables. ? Eat whole grains, such as whole wheat pasta, brown rice, or whole grain bread. Fill about one quarter of your plate with whole grains. ? Eat low-fat diary products. ? Avoid fatty cuts of meat, processed or cured meats, and poultry with skin. Fill about one quarter of your plate with lean proteins such as fish, chicken without skin, beans, eggs, and tofu. ? Avoid premade and processed foods. These tend to be higher in sodium, added sugar, and fat.  Reduce your daily sodium intake. Most people with hypertension should eat less than 1,500 mg of sodium a day.  Limit alcohol intake to no more than 1 drink a day for nonpregnant women and 2 drinks a day for men. One drink equals 12 oz of beer, 5 oz of wine, or 1 oz of hard liquor. Lifestyle  Work with your health care provider to maintain a healthy body weight, or to lose weight. Ask what an ideal weight is for you.  Get at least 30 minutes of exercise that causes your heart to beat faster (aerobic exercise) most days of the week. Activities may include walking, swimming, or biking.  Include exercise   to strengthen your muscles (resistance exercise), such as weight lifting, as part of your weekly exercise routine. Try to do these types of exercises for 30 minutes at least 3 days a week.  Do not use any products that contain nicotine or tobacco, such as cigarettes and e-cigarettes. If you need help quitting,  ask your health care provider.  Control any long-term (chronic) conditions you have, such as high cholesterol or diabetes. Monitoring  Monitor your blood pressure at home as told by your health care provider. Your personal target blood pressure may vary depending on your medical conditions, your age, and other factors.  Have your blood pressure checked regularly, as often as told by your health care provider. Working with your health care provider  Review all the medicines you take with your health care provider because there may be side effects or interactions.  Talk with your health care provider about your diet, exercise habits, and other lifestyle factors that may be contributing to hypertension.  Visit your health care provider regularly. Your health care provider can help you create and adjust your plan for managing hypertension. Will I need medicine to control my blood pressure? Your health care provider may prescribe medicine if lifestyle changes are not enough to get your blood pressure under control, and if:  Your systolic blood pressure is 130 or higher.  Your diastolic blood pressure is 80 or higher. Take medicines only as told by your health care provider. Follow the directions carefully. Blood pressure medicines must be taken as prescribed. The medicine does not work as well when you skip doses. Skipping doses also puts you at risk for problems. Contact a health care provider if:  You think you are having a reaction to medicines you have taken.  You have repeated (recurrent) headaches.  You feel dizzy.  You have swelling in your ankles.  You have trouble with your vision. Get help right away if:  You develop a severe headache or confusion.  You have unusual weakness or numbness, or you feel faint.  You have severe pain in your chest or abdomen.  You vomit repeatedly.  You have trouble breathing. Summary  Hypertension is when the force of blood pumping  through your arteries is too strong. If this condition is not controlled, it may put you at risk for serious complications.  Your personal target blood pressure may vary depending on your medical conditions, your age, and other factors. For most people, a normal blood pressure is less than 120/80.  Hypertension is managed by lifestyle changes, medicines, or both. Lifestyle changes include weight loss, eating a healthy, low-sodium diet, exercising more, and limiting alcohol. This information is not intended to replace advice given to you by your health care provider. Make sure you discuss any questions you have with your health care provider. Document Released: 04/15/2012 Document Revised: 11/13/2018 Document Reviewed: 06/19/2016 Elsevier Patient Education  2020 Elsevier Inc.  

## 2019-05-19 NOTE — Progress Notes (Signed)
Subjective:    Patient ID: Jeremy Andrews, male    DOB: 1956-04-14, 63 y.o.   MRN: [id number]  HPI  Pt presents to the clinic today for 2 week follow up of HTN. Lisinopril was d/c'd due to angioedema. He was started on Labetalol 50 mg in addition to his Amlodipine. He has been taking the medication as prescribed. His BP today is 126/74. There is no ECG on file.   Review of Systems      Past Medical History:  Diagnosis Date  . Allergy   . Colon polyps   . Diabetes mellitus without complication ([place])   . Gout   . Hypertension     Current Outpatient Medications  Medication Sig Dispense Refill  . allopurinol (ZYLOPRIM) 100 MG tablet Take 1 tablet (100 mg total) by mouth daily. 90 tablet 1  . amLODipine (NORVASC) 10 MG tablet Take 1 tablet (10 mg total) by mouth daily. 90 tablet 1  . celecoxib (CELEBREX) 200 MG capsule Take 1 capsule (200 mg total) by mouth daily. 90 capsule 1  . cetirizine (ZYRTEC) 10 MG tablet Take 1 tablet (10 mg total) by mouth daily. 90 tablet 1  . labetalol (NORMODYNE) 100 MG tablet Take 0.5 tablets (50 mg total) by mouth 2 (two) times daily. 30 tablet 0  . metFORMIN (GLUCOPHAGE) 500 MG tablet Take 1 tablet (500 mg total) by mouth 2 (two) times daily with a meal. 180 tablet 1  . omeprazole (PRILOSEC) 20 MG capsule Take 1 capsule (20 mg total) by mouth daily. 90 capsule 1   No current facility-administered medications for this visit.     Allergies  Allergen Reactions  . Lisinopril Swelling    Family History  Problem Relation Age of Onset  . Hypertension Mother   . Gout Mother   . Other Mother        meningioma  . Hypertension Father   . Kidney disease Sister   . Hypertension Sister   . Kidney failure Sister   . Other Sister        legally blind  . Diabetes Sister   . Arthritis Brother   . Gout Brother   . Breast cancer Sister   . Breast cancer Maternal Grandmother 84    Social History   Socioeconomic History  . Marital status: Legally  Separated    Spouse name: Not on file  . Number of children: 1  . Years of education: 2 years of college  . Highest education level: Not on file  Occupational History  . Not on file  Social Needs  . Financial resource strain: Not hard at all  . Food insecurity    Worry: Not on file    Inability: Not on file  . Transportation needs    Medical: Not on file    Non-medical: Not on file  Tobacco Use  . Smoking status: Former Smoker    Packs/day: 0.50    Years: 25.00    Pack years: 12.50    Types: Cigarettes    Quit date: 1990    Years since quitting: 30.8  . Smokeless tobacco: Never Used  Substance and Sexual Activity  . Alcohol use: Yes    Comment: drinks 2-3 days a week, will drink 2-4 each day  . Drug use: Not Currently    Types: Marijuana    Comment: last time around 2000 or even before then  . Sexual activity: Yes    Birth control/protection: None, Post-menopausal  Lifestyle  .  Physical activity    Days per week: Not on file    Minutes per session: Not on file  . Stress: Not on file  Relationships  . Social Herbalist on phone: Not on file    Gets together: Not on file    Attends religious service: Not on file    Active member of club or organization: Not on file    Attends meetings of clubs or organizations: Not on file    Relationship status: Not on file  . Intimate partner violence    Fear of current or ex partner: Not on file    Emotionally abused: Not on file    Physically abused: Not on file    Forced sexual activity: Not on file  Other Topics Concern  . Not on file  Social History Narrative   02/09/19   From: the area   Living: alone   Work: retired - from [employer] - grave side burial      Family: one daughter - with CP in a group home, one son who has passed, and 3 step sons, separated from wife but good relationship, father also supportive      Enjoys: not much currently, restoring cars, tablet games      Exercise: not  really, mowing the yard   Diet: does not follow a diabetic diet      Safety   Seat belts: Yes    Guns: No   Safe in relationships: Yes      Constitutional: Denies fever, malaise, fatigue, headache or abrupt weight changes.  Respiratory: Denies difficulty breathing, shortness of breath, cough or sputum production.   Cardiovascular: Denies chest pain, chest tightness, palpitations or swelling in the hands or feet.  Neurological: Denies dizziness, difficulty with memory, difficulty with speech or problems with balance and coordination.    No other specific complaints in a complete review of systems (except as listed in HPI above).  Objective:   Physical Exam   BP 126/74   Pulse 81   Temp 98.1 F (36.7 C) (Temporal)   Wt 239 lb (108.4 kg)   SpO2 97%   BMI 33.81 kg/m  Wt Readings from Last 3 Encounters:  05/19/19 239 lb (108.4 kg)  05/05/19 242 lb (109.8 kg)  02/09/19 232 lb 12 oz (105.6 kg)    General: Appears his stated age, obese, in NAD. Cardiovascular: Normal rate and rhythm. S1,S2 noted.  No murmur, rubs or gallops noted.  Pulmonary/Chest: Normal effort and positive vesicular breath sounds. No respiratory distress. No wheezes, rales or ronchi noted.  Neurological: Alert and oriented.   BMET    Component Value Date/Time   NA 137 05/04/2019 1019   NA 143 09/21/2018   K 4.1 05/04/2019 1019   CL 103 05/04/2019 1019   CO2 24 05/04/2019 1019   GLUCOSE 179 (H) 05/04/2019 1019   BUN 9 05/04/2019 1019   BUN 8 09/21/2018   CREATININE 0.92 05/04/2019 1019   CALCIUM 9.4 05/04/2019 1019   GFRNONAA >60 05/04/2019 1019   GFRAA >60 05/04/2019 1019    Lipid Panel     Component Value Date/Time   CHOL 184 09/21/2018   TRIG 148 09/21/2018   HDL 33 (A) 09/21/2018   LDLCALC 121 09/21/2018    CBC    Component Value Date/Time   WBC 5.6 05/04/2019 1019   RBC 4.03 (L) 05/04/2019 1019   HGB 12.4 (L) 05/04/2019 1019   HCT 37.1 (L) 05/04/2019 1019  PLT 212 05/04/2019  1019   MCV 92.1 05/04/2019 1019   MCH 30.8 05/04/2019 1019   MCHC 33.4 05/04/2019 1019   RDW 14.5 05/04/2019 1019   LYMPHSABS 1.3 05/04/2019 1019   MONOABS 0.5 05/04/2019 1019   EOSABS 0.1 05/04/2019 1019   BASOSABS 0.0 05/04/2019 1019    Hgb A1C Lab Results  Component Value Date   HGBA1C 5.6 09/21/2018           Assessment & Plan:

## 2019-05-19 NOTE — Assessment & Plan Note (Addendum)
Controlled on Labetalol and Amlodipine Indication for ECG: HTN Interpretation of ECG: normal rate, normal rhythm, possible old anterior infarct. Comparison of ECG: None Reinforced DASH diet and exercise for weight loss Will monitor

## 2019-05-20 ENCOUNTER — Ambulatory Visit: Payer: Medicare HMO | Admitting: Podiatry

## 2019-05-27 ENCOUNTER — Other Ambulatory Visit: Payer: Self-pay | Admitting: Internal Medicine

## 2019-05-27 ENCOUNTER — Ambulatory Visit: Payer: Medicare HMO | Admitting: Podiatry

## 2019-05-31 ENCOUNTER — Encounter: Payer: Self-pay | Admitting: Podiatry

## 2019-05-31 ENCOUNTER — Ambulatory Visit (INDEPENDENT_AMBULATORY_CARE_PROVIDER_SITE_OTHER): Payer: Medicare HMO | Admitting: Podiatry

## 2019-05-31 ENCOUNTER — Other Ambulatory Visit: Payer: Self-pay

## 2019-05-31 DIAGNOSIS — B351 Tinea unguium: Secondary | ICD-10-CM

## 2019-05-31 DIAGNOSIS — M79674 Pain in right toe(s): Secondary | ICD-10-CM | POA: Diagnosis not present

## 2019-05-31 DIAGNOSIS — M79675 Pain in left toe(s): Secondary | ICD-10-CM | POA: Diagnosis not present

## 2019-05-31 NOTE — Progress Notes (Signed)
Complaint:  Visit Type: Patient presents  to my office for  preventative foot care services. Complaint: Patient states" my nails have grown long and thick and become painful to walk and wear shoes" Patient has been diagnosed prediabetic. The patient presents for preventative foot care services. No changes to ROS  Podiatric Exam: Vascular: dorsalis pedis and posterior tibial pulses are palpable bilateral. Capillary return is immediate. Temperature gradient is WNL. Skin turgor WNL  Sensorium: Normal Semmes Weinstein monofilament test. Normal tactile sensation bilaterally. Nail Exam: Pt has thick disfigured discolored nails with subungual debris noted bilateral entire nail hallux through fifth toenails Ulcer Exam: There is no evidence of ulcer or pre-ulcerative changes or infection. Orthopedic Exam: Muscle tone and strength are WNL. No limitations in general ROM. No crepitus or effusions noted. Foot type and digits show no abnormalities. Bony prominences are unremarkable. Skin: No Porokeratosis. No infection or ulcers  Diagnosis:  Onychomycosis, , Pain in right toe, pain in left toes  Treatment & Plan Procedures and Treatment: Consent by patient was obtained for treatment procedures.   Debridement of mycotic and hypertrophic toenails, 1 through 5 bilateral and clearing of subungual debris. No ulceration, no infection noted.  Return Visit-Office Procedure: Patient instructed to return to the office for a follow up visit 3 months for continued evaluation and treatment.    Jeremy Andrews DPM 

## 2019-06-29 ENCOUNTER — Other Ambulatory Visit: Payer: Self-pay | Admitting: Family Medicine

## 2019-08-30 ENCOUNTER — Other Ambulatory Visit: Payer: Self-pay

## 2019-08-30 ENCOUNTER — Encounter: Payer: Self-pay | Admitting: Podiatry

## 2019-08-30 ENCOUNTER — Ambulatory Visit (INDEPENDENT_AMBULATORY_CARE_PROVIDER_SITE_OTHER): Payer: Medicare HMO | Admitting: Podiatry

## 2019-08-30 ENCOUNTER — Other Ambulatory Visit: Payer: Self-pay | Admitting: Family Medicine

## 2019-08-30 DIAGNOSIS — B351 Tinea unguium: Secondary | ICD-10-CM

## 2019-08-30 DIAGNOSIS — M1A09X0 Idiopathic chronic gout, multiple sites, without tophus (tophi): Secondary | ICD-10-CM | POA: Diagnosis not present

## 2019-08-30 DIAGNOSIS — E119 Type 2 diabetes mellitus without complications: Secondary | ICD-10-CM | POA: Diagnosis not present

## 2019-08-30 DIAGNOSIS — M8949 Other hypertrophic osteoarthropathy, multiple sites: Secondary | ICD-10-CM | POA: Diagnosis not present

## 2019-08-30 DIAGNOSIS — M79675 Pain in left toe(s): Secondary | ICD-10-CM

## 2019-08-30 DIAGNOSIS — M79674 Pain in right toe(s): Secondary | ICD-10-CM

## 2019-08-30 NOTE — Progress Notes (Signed)
Complaint:  Visit Type: Patient presents  to my office for  preventative foot care services. Complaint: Patient states" my nails have grown long and thick and become painful to walk and wear shoes" Patient has been diagnosed prediabetic. The patient presents for preventative foot care services. No changes to ROS  Podiatric Exam: Vascular: dorsalis pedis and posterior tibial pulses are palpable bilateral. Capillary return is immediate. Temperature gradient is WNL. Skin turgor WNL  Sensorium: Normal Semmes Weinstein monofilament test. Normal tactile sensation bilaterally. Nail Exam: Pt has thick disfigured discolored nails with subungual debris noted bilateral entire nail hallux through fifth toenails Ulcer Exam: There is no evidence of ulcer or pre-ulcerative changes or infection. Orthopedic Exam: Muscle tone and strength are WNL. No limitations in general ROM. No crepitus or effusions noted. Foot type and digits show no abnormalities. Bony prominences are unremarkable. Skin: No Porokeratosis. No infection or ulcers  Diagnosis:  Onychomycosis, , Pain in right toe, pain in left toes  Treatment & Plan Procedures and Treatment: Consent by patient was obtained for treatment procedures.   Debridement of mycotic and hypertrophic toenails, 1 through 5 bilateral and clearing of subungual debris. No ulceration, no infection noted.  Return Visit-Office Procedure: Patient instructed to return to the office for a follow up visit 3 months for continued evaluation and treatment.    Gardiner Barefoot DPM

## 2019-08-31 ENCOUNTER — Ambulatory Visit (INDEPENDENT_AMBULATORY_CARE_PROVIDER_SITE_OTHER): Payer: Medicare HMO | Admitting: Family Medicine

## 2019-08-31 ENCOUNTER — Ambulatory Visit (INDEPENDENT_AMBULATORY_CARE_PROVIDER_SITE_OTHER)
Admission: RE | Admit: 2019-08-31 | Discharge: 2019-08-31 | Disposition: A | Discharge status: Home / Self Care | Payer: Medicare HMO | Source: Ambulatory Visit | Attending: Family Medicine | Admitting: Family Medicine

## 2019-08-31 ENCOUNTER — Encounter: Payer: Self-pay | Admitting: Family Medicine

## 2019-08-31 ENCOUNTER — Other Ambulatory Visit: Payer: Medicare HMO

## 2019-08-31 VITALS — BP 140/86 | HR 93 | Temp 98.0°F | Resp 18 | Ht 70.5 in | Wt 251.5 lb

## 2019-08-31 DIAGNOSIS — M1A0790 Idiopathic chronic gout, unspecified ankle and foot, without tophus (tophi): Secondary | ICD-10-CM

## 2019-08-31 DIAGNOSIS — E782 Mixed hyperlipidemia: Secondary | ICD-10-CM

## 2019-08-31 DIAGNOSIS — R0600 Dyspnea, unspecified: Secondary | ICD-10-CM

## 2019-08-31 DIAGNOSIS — R7303 Prediabetes: Secondary | ICD-10-CM | POA: Diagnosis not present

## 2019-08-31 DIAGNOSIS — R0609 Other forms of dyspnea: Secondary | ICD-10-CM | POA: Insufficient documentation

## 2019-08-31 DIAGNOSIS — I10 Essential (primary) hypertension: Secondary | ICD-10-CM

## 2019-08-31 DIAGNOSIS — Z23 Encounter for immunization: Secondary | ICD-10-CM | POA: Diagnosis not present

## 2019-08-31 LAB — CBC WITH DIFFERENTIAL/PLATELET
Basophils Absolute: 0 10*3/uL (ref 0.0–0.1)
Basophils Relative: 0.4 % (ref 0.0–3.0)
Eosinophils Absolute: 0.1 10*3/uL (ref 0.0–0.7)
Eosinophils Relative: 1 % (ref 0.0–5.0)
HCT: 33.5 % — ABNORMAL LOW (ref 39.0–52.0)
Hemoglobin: 10.8 g/dL — ABNORMAL LOW (ref 13.0–17.0)
Lymphocytes Relative: 26 % (ref 12.0–46.0)
Lymphs Abs: 1.5 10*3/uL (ref 0.7–4.0)
MCHC: 32.3 g/dL (ref 30.0–36.0)
MCV: 86.4 fl (ref 78.0–100.0)
Monocytes Absolute: 0.5 10*3/uL (ref 0.1–1.0)
Monocytes Relative: 8.3 % (ref 3.0–12.0)
Neutro Abs: 3.8 10*3/uL (ref 1.4–7.7)
Neutrophils Relative %: 64.3 % (ref 43.0–77.0)
Platelets: 224 10*3/uL (ref 150.0–400.0)
RBC: 3.88 Mil/uL — ABNORMAL LOW (ref 4.22–5.81)
RDW: 15.7 % — ABNORMAL HIGH (ref 11.5–15.5)
WBC: 5.8 10*3/uL (ref 4.0–10.5)

## 2019-08-31 LAB — COMPREHENSIVE METABOLIC PANEL
ALT: 28 U/L (ref 0–53)
AST: 37 U/L (ref 0–37)
Albumin: 4 g/dL (ref 3.5–5.2)
Alkaline Phosphatase: 62 U/L (ref 39–117)
BUN: 10 mg/dL (ref 6–23)
CO2: 28 mEq/L (ref 19–32)
Calcium: 9.4 mg/dL (ref 8.4–10.5)
Chloride: 103 mEq/L (ref 96–112)
Creatinine, Ser: 0.8 mg/dL (ref 0.40–1.50)
GFR: 117.13 mL/min (ref >60.00)
Glucose, Bld: 135 mg/dL — ABNORMAL HIGH (ref 70–99)
Potassium: 4.2 mEq/L (ref 3.5–5.1)
Sodium: 137 mEq/L (ref 135–145)
Total Bilirubin: 0.4 mg/dL (ref 0.2–1.2)
Total Protein: 7 g/dL (ref 6.0–8.3)

## 2019-08-31 LAB — MICROALBUMIN / CREATININE URINE RATIO
Creatinine,U: 125.4 mg/dL
Microalb Creat Ratio: 31.4 mg/g — ABNORMAL HIGH (ref 0.0–30.0)
Microalb, Ur: 39.4 mg/dL — ABNORMAL HIGH (ref 0.0–1.9)

## 2019-08-31 LAB — LIPID PANEL
Cholesterol: 171 mg/dL (ref 0–200)
HDL: 41.2 mg/dL (ref >39.00)
LDL Cholesterol: 113 mg/dL — ABNORMAL HIGH (ref 0–99)
NonHDL: 129.98
Total CHOL/HDL Ratio: 4
Triglycerides: 85 mg/dL (ref 0.0–149.0)
VLDL: 17 mg/dL (ref 0.0–40.0)

## 2019-08-31 LAB — URIC ACID: Uric Acid, Serum: 6 mg/dL (ref 4.0–7.8)

## 2019-08-31 LAB — HEMOGLOBIN A1C: Hgb A1c MFr Bld: 6.2 % (ref 4.6–6.5)

## 2019-08-31 NOTE — Assessment & Plan Note (Signed)
Doing well, taking metformin. Repeat HgbA1c today.

## 2019-08-31 NOTE — Assessment & Plan Note (Signed)
With longer distances and worse over the last few months. Given risk factors (HTN, HLD, prediabetes, former smoker) and edema will evaluate with labs and CXR today. Pending results consider further work-up or cardiology referral for stress testing. EKG in the fall was normal.

## 2019-08-31 NOTE — Progress Notes (Signed)
Subjective:     Jeremy Andrews is a 64 y.o. male presenting for Follow-up     HPI   #HTN - does not check at home - taking labetalol and amlodipine - will get some SOB with activity - no cp, vision changes  # DOE is not changes - present last 3-4 months - no wheezing - no cough - ankle swelling - no PND   #Gout - taking allopurinol - takes celecoxib every other day - talked to rheumatology yesterday  - has the colchicine on hand for flares - has not taken in 6 months  #Prediabetes - taking metformin 500 mg daily - doing well    Review of Systems  05/19/2019: Clinic - HTN -  controled on labetalol and amlodipine  Social History   Tobacco Use  Smoking Status Former Smoker  . Packs/day: 0.50  . Years: 25.00  . Pack years: 12.50  . Types: Cigarettes  . Quit date: 13  . Years since quitting: 31.0  Smokeless Tobacco Never Used        Objective:    BP Readings from Last 3 Encounters:  08/31/19 140/86  05/19/19 126/74  05/05/19 (!) 142/78   Wt Readings from Last 3 Encounters:  08/31/19 251 lb 8 oz (114.1 kg)  05/19/19 239 lb (108.4 kg)  05/05/19 242 lb (109.8 kg)    BP 140/86   Pulse 93   Temp 98 F (36.7 C)   Resp 18   Ht 5' 10.5" (1.791 m)   Wt 251 lb 8 oz (114.1 kg)   SpO2 98%   BMI 35.58 kg/m    Physical Exam Constitutional:      Appearance: Normal appearance. He is not ill-appearing or diaphoretic.  HENT:     Right Ear: External ear normal.     Left Ear: External ear normal.  Eyes:     General: No scleral icterus.    Extraocular Movements: Extraocular movements intact.     Conjunctiva/sclera: Conjunctivae normal.  Neck:     Vascular: JVD (mildly elevated, though difficulty to appreciate) present.  Cardiovascular:     Rate and Rhythm: Normal rate and regular rhythm.     Heart sounds: No murmur.  Pulmonary:     Effort: Pulmonary effort is normal. No respiratory distress.     Breath sounds: Normal breath sounds. No wheezing.   Musculoskeletal:     Cervical back: Neck supple.     Right lower leg: Edema present.     Left lower leg: Edema present.     Comments: B/l ankle edema trace  Skin:    General: Skin is warm and dry.  Neurological:     Mental Status: He is alert. Mental status is at baseline.  Psychiatric:        Mood and Affect: Mood normal.           Assessment & Plan:   Problem List Items Addressed This Visit      Cardiovascular and Mediastinum   Essential hypertension    BP borderline. Will get labs today. Return for recheck in 2 months. Encouraged diet/exercise      Relevant Orders   DG Chest 2 View     Other   Prediabetes    Doing well, taking metformin. Repeat HgbA1c today.       Relevant Orders   Hemoglobin A1c   Microalbumin / creatinine urine ratio   Hyperlipidemia    Never tried other medication beside statin. Repeat today, may consider alternative  agent if worsening.       Relevant Orders   Lipid panel   Gout    See rheumatology, appreciate support. Will get blood work today to assess allopurinol.       Relevant Orders   Uric Acid   CBC with Differential   Brain natriuretic peptide   Dyspnea on exertion - Primary    With longer distances and worse over the last few months. Given risk factors (HTN, HLD, prediabetes, former smoker) and edema will evaluate with labs and CXR today. Pending results consider further work-up or cardiology referral for stress testing. EKG in the fall was normal.       Relevant Orders   DG Chest 2 View   Comprehensive metabolic panel       Return in about 2 months (around 10/29/2019) for BP .  Lesleigh Noe, MD

## 2019-08-31 NOTE — Assessment & Plan Note (Signed)
Never tried other medication beside statin. Repeat today, may consider alternative agent if worsening.

## 2019-08-31 NOTE — Assessment & Plan Note (Signed)
See rheumatology, appreciate support. Will get blood work today to assess allopurinol.

## 2019-08-31 NOTE — Addendum Note (Signed)
Addended by: Kris Mouton on: 08/31/2019 11:41 AM   Modules accepted: Orders

## 2019-08-31 NOTE — Assessment & Plan Note (Signed)
BP borderline. Will get labs today. Return for recheck in 2 months. Encouraged diet/exercise

## 2019-08-31 NOTE — Patient Instructions (Addendum)
#  Shortness of breath with activity - blood work today - Chest X-ray today - let me know if this is getting worse  #High Blood pressure - continue medication - return in 2 months - if still high will likely adjust medication

## 2019-09-01 LAB — BRAIN NATRIURETIC PEPTIDE: BNP: 27 pg/mL (ref 0.0–100.0)

## 2019-09-02 ENCOUNTER — Other Ambulatory Visit (INDEPENDENT_AMBULATORY_CARE_PROVIDER_SITE_OTHER): Payer: Medicare HMO

## 2019-09-02 DIAGNOSIS — D649 Anemia, unspecified: Secondary | ICD-10-CM | POA: Diagnosis not present

## 2019-09-02 LAB — IBC PANEL
Iron: 44 ug/dL (ref 42–165)
Saturation Ratios: 9.3 % — ABNORMAL LOW (ref 20.0–50.0)
Transferrin: 338 mg/dL (ref 212.0–360.0)

## 2019-09-06 ENCOUNTER — Other Ambulatory Visit: Payer: Self-pay | Admitting: Family Medicine

## 2019-09-06 DIAGNOSIS — D509 Iron deficiency anemia, unspecified: Secondary | ICD-10-CM

## 2019-09-06 MED ORDER — IRON 325 (65 FE) MG PO TABS: 1.0000 | ORAL_TABLET | Freq: Every day | ORAL | 3 refills | Status: DC

## 2019-09-06 NOTE — Progress Notes (Signed)
Anemia Will start iron pill Repeat blood work in 6 weeks

## 2019-10-04 ENCOUNTER — Other Ambulatory Visit: Payer: Self-pay | Admitting: Internal Medicine

## 2019-10-21 ENCOUNTER — Other Ambulatory Visit: Payer: Self-pay | Admitting: Family Medicine

## 2019-11-01 ENCOUNTER — Encounter: Payer: Self-pay | Admitting: Family Medicine

## 2019-11-01 ENCOUNTER — Ambulatory Visit (INDEPENDENT_AMBULATORY_CARE_PROVIDER_SITE_OTHER): Payer: Medicare HMO | Admitting: Family Medicine

## 2019-11-01 ENCOUNTER — Other Ambulatory Visit: Payer: Self-pay

## 2019-11-01 ENCOUNTER — Telehealth: Payer: Self-pay | Admitting: Family Medicine

## 2019-11-01 VITALS — BP 140/82 | HR 86 | Temp 98.1°F | Ht 70.5 in | Wt 250.5 lb

## 2019-11-01 DIAGNOSIS — I10 Essential (primary) hypertension: Secondary | ICD-10-CM | POA: Diagnosis not present

## 2019-11-01 DIAGNOSIS — D509 Iron deficiency anemia, unspecified: Secondary | ICD-10-CM

## 2019-11-01 LAB — CBC
HCT: 37.8 % — ABNORMAL LOW (ref 39.0–52.0)
Hemoglobin: 12.5 g/dL — ABNORMAL LOW (ref 13.0–17.0)
MCHC: 33.1 g/dL (ref 30.0–36.0)
MCV: 91.2 fl (ref 78.0–100.0)
Platelets: 197 10*3/uL (ref 150.0–400.0)
RBC: 4.14 Mil/uL — ABNORMAL LOW (ref 4.22–5.81)
RDW: 19.5 % — ABNORMAL HIGH (ref 11.5–15.5)
WBC: 6.6 10*3/uL (ref 4.0–10.5)

## 2019-11-01 LAB — FERRITIN: Ferritin: 32.6 ng/mL (ref 22.0–322.0)

## 2019-11-01 NOTE — Patient Instructions (Addendum)
1) Anemia - I will call your GI doctor - continue iron for now  2) Blood pressure - check your blood pressure at home - Call the clinic if blood pressure is high >140/90 - we may need to increase medication   3) Breathing difficulty - if this returns let me know - we will want you to get Cardiology

## 2019-11-01 NOTE — Telephone Encounter (Signed)
Jeremy Andrews with Dr Clovis Cao office and [name] wanted to let you know she did receive message and forwarded it to Dr Clovis Cao.  He is doing procedure today.  It may be tomorrow before they get back with you

## 2019-11-01 NOTE — Progress Notes (Signed)
   Subjective:     Jeremy Andrews is a 64 y.o. male presenting for Follow-up (2 month BP f/u)     HPI  #Microcytic anemia - has been taking OTC iron supplements - no constipation issues - no blood in the stool - they are darker - hx of adenoma of the colon - rare heartburn symptoms  #HTN - still taking the labetalol and amlodipine  - no cp, breathing difficulty - has cuff at home, but does not check  #DOE - feels like this is getting better - is not noticing this as often as he used to  Review of Systems   Social History   Tobacco Use  Smoking Status Former Smoker  . Packs/day: 0.50  . Years: 25.00  . Pack years: 12.50  . Types: Cigarettes  . Quit date: 58  . Years since quitting: 31.2  Smokeless Tobacco Never Used        Objective:    BP Readings from Last 3 Encounters:  11/01/19 140/82  08/31/19 140/86  05/19/19 126/74   Wt Readings from Last 3 Encounters:  11/01/19 250 lb 8 oz (113.6 kg)  08/31/19 251 lb 8 oz (114.1 kg)  05/19/19 239 lb (108.4 kg)    BP 140/82 (BP Location: Right Arm, Patient Position: Sitting, Cuff Size: Large)   Pulse 86   Temp 98.1 F (36.7 C) (Temporal)   Ht 5' 10.5" (1.791 m)   Wt 250 lb 8 oz (113.6 kg)   SpO2 98%   BMI 35.43 kg/m    Physical Exam Constitutional:      Appearance: Normal appearance. He is not ill-appearing or diaphoretic.  HENT:     Right Ear: External ear normal.     Left Ear: External ear normal.     Nose: Nose normal.  Eyes:     General: No scleral icterus.    Extraocular Movements: Extraocular movements intact.     Conjunctiva/sclera: Conjunctivae normal.  Cardiovascular:     Rate and Rhythm: Normal rate and regular rhythm.     Heart sounds: No murmur.  Pulmonary:     Effort: Pulmonary effort is normal. No respiratory distress.     Breath sounds: Normal breath sounds. No wheezing.  Musculoskeletal:     Cervical back: Neck supple.  Skin:    General: Skin is warm and dry.   Neurological:     Mental Status: He is alert. Mental status is at baseline.  Psychiatric:        Mood and Affect: Mood normal.           Assessment & Plan:   Problem List Items Addressed This Visit      Cardiovascular and Mediastinum   Essential hypertension    Encouraged home monitoring. Call back if elevated. Continue amlodipine         Other   Microcytic anemia - Primary    Repeat blood work today. Will also reach out to GI to see if closer follow-up is needed given new onset of anemia. Continue iron pending labs. DOE improved, suspect anemia was the cause.       Relevant Orders   CBC   Ferritin       Return in about 6 months (around 05/03/2020) for or sooner as needed.  Lesleigh Noe, MD

## 2019-11-01 NOTE — Assessment & Plan Note (Signed)
Repeat blood work today. Will also reach out to GI to see if closer follow-up is needed given new onset of anemia. Continue iron pending labs. DOE improved, suspect anemia was the cause.

## 2019-11-01 NOTE — Assessment & Plan Note (Signed)
Encouraged home monitoring. Call back if elevated. Continue amlodipine

## 2019-11-05 ENCOUNTER — Other Ambulatory Visit: Payer: Self-pay | Admitting: Family Medicine

## 2019-11-08 ENCOUNTER — Telehealth: Payer: Self-pay | Admitting: *Deleted

## 2019-11-08 NOTE — Telephone Encounter (Signed)
Labs faxed over. Left message for Shirlean Mylar advising this was done and if she had any questions to call me back.

## 2019-11-08 NOTE — Telephone Encounter (Signed)
Please fax last 2 CBC results, iron studies and ferritin.   Also, if our information is in [facility], they should be able to access his blood work.

## 2019-11-08 NOTE — Telephone Encounter (Signed)
Robin nurse with Dr. Angus Palms office left a voicemail stating that she is returning a call from Dr. Einar Pheasant. Shirlean Mylar stated that they are waiting on lab results on the patient. Shirlean Mylar wanted to know if the labs were done and if so they can be faxed to her at [phone]. Shirlean Mylar stated that Dr. Clovis Cao can make a recommendation after reviewing the labs.

## 2019-11-25 ENCOUNTER — Other Ambulatory Visit: Payer: Self-pay | Admitting: Family Medicine

## 2019-11-25 NOTE — Telephone Encounter (Signed)
Last refilled for both on 06/29/2019. LOV 11/01/19 follow up. Next appointment on 05/03/2020 for follow up.

## 2019-11-29 ENCOUNTER — Ambulatory Visit: Payer: Medicare HMO | Admitting: Podiatry

## 2019-11-29 ENCOUNTER — Other Ambulatory Visit: Payer: Self-pay

## 2019-11-29 ENCOUNTER — Encounter: Payer: Self-pay | Admitting: Podiatry

## 2019-11-29 VITALS — Temp 97.2°F

## 2019-11-29 DIAGNOSIS — M79674 Pain in right toe(s): Secondary | ICD-10-CM

## 2019-11-29 DIAGNOSIS — B351 Tinea unguium: Secondary | ICD-10-CM

## 2019-11-29 DIAGNOSIS — M79675 Pain in left toe(s): Secondary | ICD-10-CM

## 2019-11-29 DIAGNOSIS — R7303 Prediabetes: Secondary | ICD-10-CM

## 2019-11-29 NOTE — Progress Notes (Signed)
This patient returns to my office for at risk foot care.  This patient requires this care by a professional since this patient will be at risk due to having diabetes according to patient.  This patient is unable to cut nails himself since the patient cannot reach his nails.These nails are painful walking and wearing shoes.  This patient presents for at risk foot care today.  General Appearance  Alert, conversant and in no acute stress.  Vascular  Dorsalis pedis and posterior tibial  pulses are palpable  bilaterally.  Capillary return is within normal limits  bilaterally. Temperature is within normal limits  bilaterally.  Neurologic  Senn-Weinstein monofilament wire test within normal limits  bilaterally. Muscle power within normal limits bilaterally.  Nails Thick disfigured discolored nails with subungual debris  from hallux to fifth toes bilaterally. No evidence of bacterial infection or drainage bilaterally.  Orthopedic  No limitations of motion  feet .  No crepitus or effusions noted.  No bony pathology or digital deformities noted.  Skin  normotropic skin with no porokeratosis noted bilaterally.  No signs of infections or ulcers noted.     Onychomycosis  Pain in right toes  Pain in left toes  Consent was obtained for treatment procedures.   Mechanical debridement of nails 1-5  bilaterally performed with a nail nipper.  Filed with dremel without incident.    Return office visit    3 months                  Told patient to return for periodic foot care and evaluation due to potential at risk complications.   Asuzena Weis DPM  

## 2019-12-07 NOTE — Telephone Encounter (Signed)
Appreciate the update. Will plan for patient to continue with his GI follow-up as previously plan and continued anemia treatment.

## 2019-12-07 NOTE — Telephone Encounter (Signed)
Spoke with Robin. She has tried to call us in the last 2 weeks but could not get through. Robin Dr Clovis Cao did review recent labs we sent over and all the procedures he had with them over the few years. Dr Clovis Cao did not feel like the cauase of the low levels/anemia was associated/coming from a GI issue based on the reports/results. He did say if Dr Einar Pheasant still wanted to have patient set up for consultation with GI about this to put in a referral to them to discuss. Patient has not had a consultation with a provider with [facility] before just has been getting procedures done. Shirlean Mylar said if Dr Einar Pheasant had any other questions or concerns to give her a call back.

## 2019-12-07 NOTE — Telephone Encounter (Signed)
Please call Dr. Angus Palms to see if he has had a chance to review labs and if he has any recommendations.

## 2019-12-07 NOTE — Telephone Encounter (Signed)
Called Christianne Dolin Roster/Nurse [phone]  And left detailed message asking if Dr Clovis Cao had a chance to review labs and to call us back with feedback.

## 2019-12-08 ENCOUNTER — Other Ambulatory Visit: Payer: Self-pay | Admitting: Family Medicine

## 2019-12-09 NOTE — Telephone Encounter (Signed)
Refill request from pharmacy.  Last filled on 10/04/19-TAKE 1/2 TABLET (50MG ) TWICE DAILY #90 with 0 refill  LOV 11/01/19. Next appointment on 05/03/20

## 2020-01-06 ENCOUNTER — Telehealth: Payer: Self-pay | Admitting: Podiatry

## 2020-01-06 NOTE — Telephone Encounter (Signed)
Pt left message yesterday per Liliane Channel to call with number on bottom of old orthotics. The number was [phone].Marland Kitchen

## 2020-02-29 DIAGNOSIS — M1000 Idiopathic gout, unspecified site: Secondary | ICD-10-CM | POA: Diagnosis not present

## 2020-03-02 ENCOUNTER — Encounter: Payer: Self-pay | Admitting: Podiatry

## 2020-03-02 ENCOUNTER — Ambulatory Visit: Payer: Medicare HMO | Admitting: Podiatry

## 2020-03-02 ENCOUNTER — Other Ambulatory Visit: Payer: Self-pay

## 2020-03-02 DIAGNOSIS — M79674 Pain in right toe(s): Secondary | ICD-10-CM

## 2020-03-02 DIAGNOSIS — R7303 Prediabetes: Secondary | ICD-10-CM

## 2020-03-02 DIAGNOSIS — B351 Tinea unguium: Secondary | ICD-10-CM | POA: Diagnosis not present

## 2020-03-02 DIAGNOSIS — M79675 Pain in left toe(s): Secondary | ICD-10-CM | POA: Diagnosis not present

## 2020-03-02 NOTE — Progress Notes (Signed)
This patient returns to my office for at risk foot care.  This patient requires this care by a professional since this patient will be at risk due to having diabetes according to patient.  This patient is unable to cut nails himself since the patient cannot reach his nails.These nails are painful walking and wearing shoes.  This patient presents for at risk foot care today.  General Appearance  Alert, conversant and in no acute stress.  Vascular  Dorsalis pedis and posterior tibial  pulses are palpable  bilaterally.  Capillary return is within normal limits  bilaterally. Temperature is within normal limits  bilaterally.  Neurologic  Senn-Weinstein monofilament wire test within normal limits  bilaterally. Muscle power within normal limits bilaterally.  Nails Thick disfigured discolored nails with subungual debris  from hallux to fifth toes bilaterally. No evidence of bacterial infection or drainage bilaterally.  Orthopedic  No limitations of motion  feet .  No crepitus or effusions noted.  No bony pathology or digital deformities noted.  Skin  normotropic skin with no porokeratosis noted bilaterally.  No signs of infections or ulcers noted.     Onychomycosis  Pain in right toes  Pain in left toes  Consent was obtained for treatment procedures.   Mechanical debridement of nails 1-5  bilaterally performed with a nail nipper.  Filed with dremel without incident.    Return office visit    3 months                  Told patient to return for periodic foot care and evaluation due to potential at risk complications.   Chaney Ingram DPM  

## 2020-04-28 ENCOUNTER — Other Ambulatory Visit: Payer: Self-pay | Admitting: Family Medicine

## 2020-04-28 NOTE — Telephone Encounter (Signed)
Last OV: 11/01/19 Filled: 12/09/19 by historical provider Next OV: 05/03/20

## 2020-05-03 ENCOUNTER — Other Ambulatory Visit: Payer: Self-pay

## 2020-05-03 ENCOUNTER — Encounter: Payer: Self-pay | Admitting: Family Medicine

## 2020-05-03 ENCOUNTER — Telehealth: Payer: Self-pay | Admitting: Family Medicine

## 2020-05-03 ENCOUNTER — Ambulatory Visit (INDEPENDENT_AMBULATORY_CARE_PROVIDER_SITE_OTHER): Payer: Medicare HMO | Admitting: Family Medicine

## 2020-05-03 VITALS — BP 110/76 | HR 80 | Temp 97.1°F | Ht 71.0 in | Wt 242.0 lb

## 2020-05-03 DIAGNOSIS — R7303 Prediabetes: Secondary | ICD-10-CM | POA: Diagnosis not present

## 2020-05-03 DIAGNOSIS — M791 Myalgia, unspecified site: Secondary | ICD-10-CM | POA: Insufficient documentation

## 2020-05-03 DIAGNOSIS — M1A0790 Idiopathic chronic gout, unspecified ankle and foot, without tophus (tophi): Secondary | ICD-10-CM

## 2020-05-03 DIAGNOSIS — I10 Essential (primary) hypertension: Secondary | ICD-10-CM | POA: Diagnosis not present

## 2020-05-03 DIAGNOSIS — T466X5A Adverse effect of antihyperlipidemic and antiarteriosclerotic drugs, initial encounter: Secondary | ICD-10-CM | POA: Diagnosis not present

## 2020-05-03 DIAGNOSIS — E782 Mixed hyperlipidemia: Secondary | ICD-10-CM | POA: Diagnosis not present

## 2020-05-03 NOTE — Assessment & Plan Note (Signed)
Will continue to monitor off stating due to [facility]

## 2020-05-03 NOTE — Telephone Encounter (Signed)
Pt called to advise he had the  1st Pfzier covid vaccine 09/28/2019 and the 2nd one 10/19/2019.  Thank you!

## 2020-05-03 NOTE — Patient Instructions (Signed)
#  Covid booster - would encourage this at 8 months  #Blood Pressure - continue current medications  #Prediabetes - continue metformin

## 2020-05-03 NOTE — Progress Notes (Signed)
   Subjective:     Jeremy Andrews is a 64 y.o. male presenting for Follow-up (6 month )     HPI  #HTN - blood pressure doing well - overall feels great! - more energy - no cp, ha, sob  #Prediabetes - taking metformin  Review of Systems  11/01/2019: Clinic - BP slightly elevated, home monitoring. Anemia - GI up to date  Social History   Tobacco Use  Smoking Status Former Smoker  . Packs/day: 0.50  . Years: 25.00  . Pack years: 12.50  . Types: Cigarettes  . Quit date: 63  . Years since quitting: 31.7  Smokeless Tobacco Never Used        Objective:    BP Readings from Last 3 Encounters:  05/03/20 110/76  11/01/19 140/82  08/31/19 140/86   Wt Readings from Last 3 Encounters:  05/03/20 242 lb (109.8 kg)  11/01/19 250 lb 8 oz (113.6 kg)  08/31/19 251 lb 8 oz (114.1 kg)    BP 110/76   Pulse 80   Temp (!) 97.1 F (36.2 C) (Temporal)   Ht 5\' 11"  (1.803 m)   Wt 242 lb (109.8 kg)   SpO2 100%   BMI 33.75 kg/m    Physical Exam Constitutional:      Appearance: Normal appearance. He is not ill-appearing or diaphoretic.  HENT:     Right Ear: External ear normal.     Left Ear: External ear normal.     Nose: Nose normal.  Eyes:     General: No scleral icterus.    Extraocular Movements: Extraocular movements intact.     Conjunctiva/sclera: Conjunctivae normal.  Cardiovascular:     Rate and Rhythm: Normal rate and regular rhythm.     Heart sounds: No murmur heard.   Pulmonary:     Effort: Pulmonary effort is normal. No respiratory distress.     Breath sounds: Normal breath sounds. No wheezing.  Musculoskeletal:     Cervical back: Neck supple.  Skin:    General: Skin is warm and dry.  Neurological:     Mental Status: He is alert. Mental status is at baseline.  Psychiatric:        Mood and Affect: Mood normal.           Assessment & Plan:   Problem List Items Addressed This Visit      Cardiovascular and Mediastinum   Essential hypertension  - Primary    BP at goal. Cont amlodipine and labetalol        Other   Prediabetes    Lab Results  Component Value Date   HGBA1C 6.2 08/31/2019   Cont metformin, remaining in prediabetes range      Hyperlipidemia    Will continue to monitor off stating due to mylagia      Gout    Stable. Cont allopurinol      Myalgia due to statin       Return in about 6 months (around 10/31/2020) for Medicare Wellness visit.  Lesleigh Noe, MD  This visit occurred during the SARS-CoV-2 public health emergency.  Safety protocols were in place, including screening questions prior to the visit, additional usage of staff PPE, and extensive cleaning of exam room while observing appropriate contact time as indicated for disinfecting solutions.

## 2020-05-03 NOTE — Assessment & Plan Note (Signed)
BP at goal. Cont amlodipine and labetalol

## 2020-05-03 NOTE — Assessment & Plan Note (Signed)
Lab Results  Component Value Date   HGBA1C 6.2 08/31/2019   Cont metformin, remaining in prediabetes range

## 2020-05-03 NOTE — Assessment & Plan Note (Signed)
Stable. Cont allopurinol

## 2020-05-17 ENCOUNTER — Other Ambulatory Visit: Payer: Self-pay | Admitting: Family Medicine

## 2020-06-05 ENCOUNTER — Ambulatory Visit: Payer: Medicare HMO | Admitting: Podiatry

## 2020-06-05 ENCOUNTER — Encounter: Payer: Self-pay | Admitting: Podiatry

## 2020-06-05 ENCOUNTER — Other Ambulatory Visit: Payer: Self-pay

## 2020-06-05 DIAGNOSIS — M79674 Pain in right toe(s): Secondary | ICD-10-CM | POA: Diagnosis not present

## 2020-06-05 DIAGNOSIS — B351 Tinea unguium: Secondary | ICD-10-CM | POA: Diagnosis not present

## 2020-06-05 DIAGNOSIS — M79675 Pain in left toe(s): Secondary | ICD-10-CM

## 2020-06-05 NOTE — Progress Notes (Signed)
This patient returns to my office for at risk foot care.  This patient requires this care by a professional since this patient will be at risk due to having diabetes according to patient.  This patient is unable to cut nails himself since the patient cannot reach his nails.These nails are painful walking and wearing shoes.  This patient presents for at risk foot care today.  General Appearance  Alert, conversant and in no acute stress.  Vascular  Dorsalis pedis and posterior tibial  pulses are palpable  bilaterally.  Capillary return is within normal limits  bilaterally. Temperature is within normal limits  bilaterally.  Neurologic  Senn-Weinstein monofilament wire test within normal limits  bilaterally. Muscle power within normal limits bilaterally.  Nails Thick disfigured discolored nails with subungual debris  from hallux to fifth toes bilaterally. No evidence of bacterial infection or drainage bilaterally.  Orthopedic  No limitations of motion  feet .  No crepitus or effusions noted.  No bony pathology or digital deformities noted.  Skin  normotropic skin with no porokeratosis noted bilaterally.  No signs of infections or ulcers noted.     Onychomycosis  Pain in right toes  Pain in left toes  Consent was obtained for treatment procedures.   Mechanical debridement of nails 1-5  bilaterally performed with a nail nipper.  Filed with dremel without incident.    Return office visit    3 months                  Told patient to return for periodic foot care and evaluation due to potential at risk complications.   Gardiner Barefoot DPM

## 2020-06-07 ENCOUNTER — Other Ambulatory Visit: Payer: Self-pay | Admitting: Family Medicine

## 2020-09-07 ENCOUNTER — Other Ambulatory Visit: Payer: Self-pay | Admitting: Family Medicine

## 2020-09-07 ENCOUNTER — Ambulatory Visit: Payer: Medicare HMO | Admitting: Podiatry

## 2020-09-18 ENCOUNTER — Other Ambulatory Visit: Payer: Self-pay | Admitting: Family Medicine

## 2020-10-02 ENCOUNTER — Other Ambulatory Visit: Payer: Self-pay

## 2020-10-02 ENCOUNTER — Ambulatory Visit: Payer: Medicare HMO | Admitting: Podiatry

## 2020-10-02 ENCOUNTER — Encounter: Payer: Self-pay | Admitting: Podiatry

## 2020-10-02 DIAGNOSIS — R7303 Prediabetes: Secondary | ICD-10-CM

## 2020-10-02 DIAGNOSIS — B351 Tinea unguium: Secondary | ICD-10-CM | POA: Diagnosis not present

## 2020-10-02 DIAGNOSIS — M79674 Pain in right toe(s): Secondary | ICD-10-CM

## 2020-10-02 DIAGNOSIS — M79675 Pain in left toe(s): Secondary | ICD-10-CM

## 2020-10-02 NOTE — Progress Notes (Signed)
This patient returns to my office for at risk foot care.  This patient requires this care by a professional since this patient will be at risk due to having diabetes according to patient.  This patient is unable to cut nails himself since the patient cannot reach his nails.These nails are painful walking and wearing shoes.  This patient presents for at risk foot care today.  General Appearance  Alert, conversant and in no acute stress.  Vascular  Dorsalis pedis and posterior tibial  pulses are weakly  palpable  bilaterally.  Capillary return is within normal limits  bilaterally. Temperature is within normal limits  bilaterally.  Neurologic  Senn-Weinstein monofilament wire test within normal limits  bilaterally. Muscle power within normal limits bilaterally.  Nails Thick disfigured discolored nails with subungual debris  from hallux to fifth toes bilaterally. No evidence of bacterial infection or drainage bilaterally.  Orthopedic  No limitations of motion  feet .  No crepitus or effusions noted.  No bony pathology or digital deformities noted.  Skin  normotropic skin with no porokeratosis noted bilaterally.  No signs of infections or ulcers noted.     Onychomycosis  Pain in right toes  Pain in left toes  Consent was obtained for treatment procedures.   Mechanical debridement of nails 1-5  bilaterally performed with a nail nipper.  Filed with dremel without incident.    Return office visit    3 months                  Told patient to return for periodic foot care and evaluation due to potential at risk complications.   Brace Welte DPM  

## 2020-10-19 DIAGNOSIS — Z79899 Other long term (current) drug therapy: Secondary | ICD-10-CM | POA: Diagnosis not present

## 2020-10-19 DIAGNOSIS — M1A09X0 Idiopathic chronic gout, multiple sites, without tophus (tophi): Secondary | ICD-10-CM | POA: Diagnosis not present

## 2020-10-19 DIAGNOSIS — M1000 Idiopathic gout, unspecified site: Secondary | ICD-10-CM | POA: Diagnosis not present

## 2020-10-19 DIAGNOSIS — Z5181 Encounter for therapeutic drug level monitoring: Secondary | ICD-10-CM | POA: Diagnosis not present

## 2020-10-31 ENCOUNTER — Ambulatory Visit (INDEPENDENT_AMBULATORY_CARE_PROVIDER_SITE_OTHER): Payer: Medicare HMO

## 2020-10-31 ENCOUNTER — Other Ambulatory Visit: Payer: Self-pay

## 2020-10-31 DIAGNOSIS — E119 Type 2 diabetes mellitus without complications: Secondary | ICD-10-CM

## 2020-10-31 DIAGNOSIS — Z0000 Encounter for general adult medical examination without abnormal findings: Secondary | ICD-10-CM | POA: Diagnosis not present

## 2020-10-31 DIAGNOSIS — Z0100 Encounter for examination of eyes and vision without abnormal findings: Secondary | ICD-10-CM

## 2020-10-31 NOTE — Progress Notes (Signed)
Subjective:   Jeremy Andrews is a 65 y.o. male who presents for Medicare Annual/Subsequent preventive examination.  Review of Systems: N/A      I connected with the patient today by telephone and verified that I am speaking with the correct person using two identifiers. Location patient: home Location nurse: work Persons participating in the telephone visit: patient, nurse.   I discussed the limitations, risks, security and privacy concerns of performing an evaluation and management service by telephone and the availability of in person appointments. I also discussed with the patient that there may be a patient responsible charge related to this service. The patient expressed understanding and verbally consented to this telephonic visit.        Cardiac Risk Factors include: advanced age (>20men, >68 women);male gender;diabetes mellitus;hypertension;Other (see comment), Risk factor comments: hyperlipidemia     Objective:    Today's Vitals   There is no height or weight on file to calculate BMI.  Advanced Directives 10/31/2020  Does Patient Have a Medical Advance Directive? No  Would patient like information on creating a medical advance directive? No - Patient declined    Current Medications (verified) Outpatient Encounter Medications as of 10/31/2020  Medication Sig  . allopurinol (ZYLOPRIM) 100 MG tablet Take 1 tablet (100 mg total) by mouth daily.  Marland Kitchen amLODipine (NORVASC) 10 MG tablet Take 1 tablet (10 mg total) by mouth daily.  . celecoxib (CELEBREX) 200 MG capsule TAKE 1 CAPSULE EVERY DAY  . cetirizine (ZYRTEC) 10 MG tablet TAKE 1 TABLET BY MOUTH EVERY DAY  . Ferrous Sulfate (IRON) 325 (65 Fe) MG TABS Take 1 tablet (325 mg total) by mouth daily.  Marland Kitchen labetalol (NORMODYNE) 100 MG tablet TAKE 1/2 TABLET TWICE DAILY  . metFORMIN (GLUCOPHAGE) 500 MG tablet Take 1 tablet (500 mg total) by mouth 2 (two) times daily with a meal.  . omeprazole (PRILOSEC) 20 MG capsule TAKE 1 CAPSULE  EVERY DAY   No facility-administered encounter medications on file as of 10/31/2020.    Allergies (verified) Lisinopril and Statins   History: Past Medical History:  Diagnosis Date  . Allergy   . Colon polyps   . Diabetes mellitus without complication ([place])   . Gout   . Hypertension    Past Surgical History:  Procedure Laterality Date  . NO PAST SURGERIES     Family History  Problem Relation Age of Onset  . Hypertension Mother   . Gout Mother   . Other Mother        meningioma  . Hypertension Father   . Kidney disease Sister   . Hypertension Sister   . Kidney failure Sister   . Other Sister        legally blind  . Diabetes Sister   . Arthritis Brother   . Gout Brother   . Breast cancer Sister   . Breast cancer Maternal Grandmother 5   Social History   Socioeconomic History  . Marital status: Legally Separated    Spouse name: Not on file  . Number of children: 1  . Years of education: 2 years of college  . Highest education level: Not on file  Occupational History  . Not on file  Tobacco Use  . Smoking status: Former Smoker    Packs/day: 0.50    Years: 25.00    Pack years: 12.50    Types: Cigarettes    Quit date: 1990    Years since quitting: 32.2  . Smokeless tobacco: Never Used  Vaping Use  . Vaping Use: Never used  Substance and Sexual Activity  . Alcohol use: Yes    Alcohol/week: 21.0 - 28.0 standard drinks    Types: 21 - 28 Cans of beer per week    Comment: 3-4 beers daily   . Drug use: Not Currently    Types: Marijuana    Comment: last time around 2000 or even before then  . Sexual activity: Yes    Birth control/protection: None, Post-menopausal  Other Topics Concern  . 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 Difficulty of Paying Living Expenses: Not hard at all  Food Insecurity: No Food Insecurity  . Worried About Charity fundraiser in the Last Year: Never true  . Ran Out of Food in the Last Year: Never true  Transportation Needs: No Transportation Needs  . Lack of Transportation (Medical): No  . Lack of Transportation (Non-Medical): No  Physical Activity: Inactive  . Days of Exercise per Week: 0 days  . Minutes of Exercise per Session: 0 min  Stress: No Stress Concern Present  . Feeling of Stress : Not at all  Social Connections: Not on file    Tobacco Counseling Counseling given: Not Answered   Clinical Intake:  Pre-visit preparation completed: Yes  Pain : No/denies pain     Nutritional Risks: None Diabetes: Yes CBG done?: No Did pt. bring in CBG monitor from home?: No  How often do you need to have someone help you when you read instructions, pamphlets, or other written materials from your doctor or pharmacy?: 1 - Never What is the last grade level you completed in school?: 2 years of college  Diabetic: Yes Nutrition Risk Assessment:  Has the patient had any N/V/D within the last 2 months?  No  Does the patient have any non-healing wounds?  No  Has the patient had any unintentional weight loss or weight gain?  No   Diabetes:  Is the patient diabetic?  Yes  If diabetic, was a CBG obtained today?  No  telephone visit  Did the patient bring in their glucometer from home?  No  telephone visit  How often do you monitor your CBG's? never.   Financial Strains and Diabetes Management:  Are you having any financial strains with the  device, your supplies or your medication? No .  Does the patient want to be seen by Chronic Care Management for management of their diabetes?  No  Would the patient like to be referred to a Nutritionist or for Diabetic Management?  No   Diabetic Exams:  Diabetic Eye Exam: Overdue for diabetic eye exam. Pt has been advised about the importance in completing this exam. Patient advised to call and schedule an eye  exam. Diabetic Foot Exam: Completed 06/05/2020   Interpreter Needed?: No  Information entered by :: CJohnson, LPN   Activities of Daily Living In your present state of health, do you have any difficulty performing the following activities: 10/31/2020  Hearing? N  Vision? N  Difficulty concentrating or making decisions? N  Walking or climbing stairs? N  Dressing or bathing? N  Doing errands, shopping? N  Preparing Food and eating ? N  Using the Toilet? N  Do you have problems with loss of bowel control? N  Managing your Medications? N  Managing your Finances? N  Housekeeping or managing your Housekeeping? N  Some recent data might be hidden    Patient Care Team: Lesleigh Noe, MD as PCP - General (Family Medicine) Michiel Cowboy, MD as Referring Physician (Gastroenterology)  Indicate any recent Medical Services you may have received from other than [facility] providers in the past year (date may be approximate).     Assessment:   This is a routine wellness examination for Cora.  Hearing/Vision screen  Hearing Screening   125Hz  250Hz  500Hz  1000Hz  2000Hz  3000Hz  4000Hz  6000Hz  8000Hz   Right ear:           Left ear:           Vision Screening Comments: Advised patient to get annual eye exams. Has not had one in a while.   Dietary issues and exercise activities discussed: Current Exercise Habits: The patient does not participate in regular exercise at present, Exercise limited by: None identified  Goals    . Patient Stated     10/31/2020, I will maintain and continue  medications as prescribed.       Depression Screen PHQ 2/9 Scores 10/31/2020 02/10/2019  PHQ - 2 Score 0 1  PHQ- 9 Score 0 4    Fall Risk Fall Risk  10/31/2020 05/03/2020 02/10/2019  Falls in the past year? 0 0 0  Number falls in past yr: 0 - 0  Injury with Fall? 0 - 0  Risk for fall due to : Medication side effect - -  Follow up Falls evaluation completed;Falls prevention discussed - -    FALL RISK PREVENTION PERTAINING TO THE HOME:  Any stairs in or around the home? Yes  If so, are there any without handrails? No  Home free of loose throw rugs in walkways, pet beds, electrical cords, etc? Yes  Adequate lighting in your home to reduce risk of falls? Yes   ASSISTIVE DEVICES UTILIZED TO PREVENT FALLS:  Life alert? No  Use of a cane, walker or w/c? Yes  Grab bars in the bathroom? No  Shower chair or bench in shower? Yes  Elevated toilet seat or a handicapped toilet? No   TIMED UP AND GO:  Was the test performed? N/A telephone visit .    Cognitive Function: MMSE - Mini Mental State Exam 10/31/2020  Orientation to time 5  Orientation to Place 5  Registration 3  Attention/ Calculation 5  Recall 3  Language- repeat 1       Mini Cog  Mini-Cog screen was completed. Maximum score is 22. A value of 0 denotes this part of the MMSE was not completed or the patient failed this part of the Mini-Cog screening.  Immunizations Immunization History  Administered Date(s) Administered  . PFIZER Comirnaty(Gray Top)Covid-19 Tri-Sucrose Vaccine 09/26/2020  . PFIZER(Purple Top)SARS-COV-2 Vaccination 09/28/2019, 10/19/2019  . Pneumococcal Conjugate-13 03/07/2017  . Pneumococcal Polysaccharide-23 08/31/2019  . Tdap 03/07/2017    TDAP  status: Up to date  Flu Vaccine status: Declined, Education has been provided regarding the importance of this vaccine but patient still declined. Advised may receive this vaccine at local pharmacy or Health Dept. Aware to provide a copy of the vaccination  record if obtained from local pharmacy or Health Dept. Verbalized acceptance and understanding.  Pneumococcal vaccine status: Up to date  Covid-19 vaccine status: Completed vaccines  Qualifies for Shingles Vaccine? Yes   Zostavax completed No   Shingrix Completed?: No.    Education has been provided regarding the importance of this vaccine. Patient has been advised to call insurance company to determine out of pocket expense if they have not yet received this vaccine. Advised may also receive vaccine at local pharmacy or Health Dept. Verbalized acceptance and understanding.  Screening Tests Health Maintenance  Topic Date Due  . OPHTHALMOLOGY EXAM  Never done  . HEMOGLOBIN A1C  02/28/2020  . URINE MICROALBUMIN  08/30/2020  . INFLUENZA VACCINE  12/17/2020 (Originally 03/05/2020)  . COLONOSCOPY (Pts 45-52yrs Insurance coverage will need to be confirmed)  02/03/2021  . FOOT EXAM  06/05/2021  . TETANUS/TDAP  03/08/2027  . COVID-19 Vaccine  Completed  . Hepatitis C Screening  Completed  . PNA vac Low Risk Adult  Completed  . HPV VACCINES  Aged Out    Health Maintenance  Health Maintenance Due  Topic Date Due  . OPHTHALMOLOGY EXAM  Never done  . HEMOGLOBIN A1C  02/28/2020  . URINE MICROALBUMIN  08/30/2020    Colorectal cancer screening: Type of screening: Colonoscopy. Completed 02/03/2018. Repeat every 3 years  Lung Cancer Screening: (Low Dose CT Chest recommended if Age 72-80 years, 30 pack-year currently smoking OR have quit w/in 15years.) does not qualify.   Additional Screening:  Hepatitis C Screening: does qualify; Completed 09/21/2018  Vision Screening: Recommended annual ophthalmology exams for early detection of glaucoma and other disorders of the eye. Is the patient up to date with their annual eye exam?  No  Who is the provider or what is the name of the office in which the patient attends annual eye exams? Does not have one. Has not seen one in over 5 years. If pt is not  established with a provider, would they like to be referred to a provider to establish care? Yes. Referral placed.   Dental Screening: Recommended annual dental exams for proper oral hygiene  Community Resource Referral / Chronic Care Management: CRR required this visit?  No   CCM required this visit?  No      Plan:     I have personally reviewed and noted the following in the patient's chart:   . Medical and social history . Use of alcohol, tobacco or illicit drugs  . Current medications and supplements . Functional ability and status . Nutritional status . Physical activity . Advanced directives . List of other physicians . Hospitalizations, surgeries, and ER visits in previous 12 months . Vitals . Screenings to include cognitive, depression, and falls . Referrals and appointments  In addition, I have reviewed and discussed with patient certain preventive protocols, quality metrics, and best practice recommendations. A written personalized care plan for preventive services as well as general preventive health recommendations were provided to patient.   Due to this being a telephonic visit, the after visit summary with patients personalized plan was offered to patient via office or my-chart. Patient preferred to pick up at office at next visit or via mychart.   Andrez Grime, LPN  10/31/2020      

## 2020-10-31 NOTE — Progress Notes (Signed)
PCP notes:  Health Maintenance: Flu- declined Eye exam- due, referral placed   Abnormal Screenings: none   Patient concerns: none   Nurse concerns: none   Next PCP appt.: none

## 2020-10-31 NOTE — Patient Instructions (Signed)
Jeremy Andrews , Thank you for taking time to come for your Medicare Wellness Visit. I appreciate your ongoing commitment to your health goals. Please review the following plan we discussed and let me know if I can assist you in the future.   Screening recommendations/referrals: Colonoscopy: Up to date, completed 02/03/2018, due 02/2028 Recommended yearly ophthalmology/optometry visit for glaucoma screening and checkup Recommended yearly dental visit for hygiene and checkup  Vaccinations: Influenza vaccine: declined Pneumococcal vaccine: Completed series Tdap vaccine: Up to date, completed 03/07/2017, due 03/2027 Shingles vaccine: due, check with your insurance regarding coverage if interested    Covid-19: Completed series  Advanced directives: Advance directive discussed with you today. Even though you declined this today please call our office should you change your mind and we can give you the proper paperwork for you to fill out.  Conditions/risks identified: diabetes, hypertension, hyperlipidemia   Next appointment: Follow up in one year for your annual wellness visit.   Preventive Care 67 Years and Older, Male Preventive care refers to lifestyle choices and visits with your health care provider that can promote health and wellness. What does preventive care include?  A yearly physical exam. This is also called an annual well check.  Dental exams once or twice a year.  Routine eye exams. Ask your health care provider how often you should have your eyes checked.  Personal lifestyle choices, including:  Daily care of your teeth and gums.  Regular physical activity.  Eating a healthy diet.  Avoiding tobacco and drug use.  Limiting alcohol use.  Practicing safe sex.  Taking low doses of aspirin every day.  Taking vitamin and mineral supplements as recommended by your health care provider. What happens during an annual well check? The services and screenings done by your health  care provider during your annual well check will depend on your age, overall health, lifestyle risk factors, and family history of disease. Counseling  Your health care provider may ask you questions about your:  Alcohol use.  Tobacco use.  Drug use.  Emotional well-being.  Home and relationship well-being.  Sexual activity.  Eating habits.  History of falls.  Memory and ability to understand (cognition).  Work and work Statistician. Screening  You may have the following tests or measurements:  Height, weight, and BMI.  Blood pressure.  Lipid and cholesterol levels. These may be checked every 5 years, or more frequently if you are over 52 years old.  Skin check.  Lung cancer screening. You may have this screening every year starting at age 41 if you have a 30-pack-year history of smoking and currently smoke or have quit within the past 15 years.  Fecal occult blood test (FOBT) of the stool. You may have this test every year starting at age 2.  Flexible sigmoidoscopy or colonoscopy. You may have a sigmoidoscopy every 5 years or a colonoscopy every 10 years starting at age 16.  Prostate cancer screening. Recommendations will vary depending on your family history and other risks.  Hepatitis C blood test.  Hepatitis B blood test.  Sexually transmitted disease (STD) testing.  Diabetes screening. This is done by checking your blood sugar (glucose) after you have not eaten for a while (fasting). You may have this done every 1-3 years.  Abdominal aortic aneurysm (AAA) screening. You may need this if you are a current or former smoker.  Osteoporosis. You may be screened starting at age 29 if you are at high risk. Talk with your health care  provider about your test results, treatment options, and if necessary, the need for more tests. Vaccines  Your health care provider may recommend certain vaccines, such as:  Influenza vaccine. This is recommended every  year.  Tetanus, diphtheria, and acellular pertussis (Tdap, Td) vaccine. You may need a Td booster every 10 years.  Zoster vaccine. You may need this after age 77.  Pneumococcal 13-valent conjugate (PCV13) vaccine. One dose is recommended after age 71.  Pneumococcal polysaccharide (PPSV23) vaccine. One dose is recommended after age 68. Talk to your health care provider about which screenings and vaccines you need and how often you need them. This information is not intended to replace advice given to you by your health care provider. Make sure you discuss any questions you have with your health care provider. Document Released: 08/18/2015 Document Revised: 04/10/2016 Document Reviewed: 05/23/2015 Elsevier Interactive Patient Education  2017 [place] Prevention in the Home Falls can cause injuries. They can happen to people of all ages. There are many things you can do to make your home safe and to help prevent falls. What can I do on the outside of my home?  Regularly fix the edges of walkways and driveways and fix any cracks.  Remove anything that might make you trip as you walk through a door, such as a raised step or threshold.  Trim any bushes or trees on the path to your home.  Use bright outdoor lighting.  Clear any walking paths of anything that might make someone trip, such as rocks or tools.  Regularly check to see if handrails are loose or broken. Make sure that both sides of any steps have handrails.  Any raised decks and porches should have guardrails on the edges.  Have any leaves, snow, or ice cleared regularly.  Use sand or salt on walking paths during winter.  Clean up any spills in your garage right away. This includes oil or grease spills. What can I do in the bathroom?  Use night lights.  Install grab bars by the toilet and in the tub and shower. Do not use towel bars as grab bars.  Use non-skid mats or decals in the tub or shower.  If you  need to sit down in the shower, use a plastic, non-slip stool.  Keep the floor dry. Clean up any water that spills on the floor as soon as it happens.  Remove soap buildup in the tub or shower regularly.  Attach bath mats securely with double-sided non-slip rug tape.  Do not have throw rugs and other things on the floor that can make you trip. What can I do in the bedroom?  Use night lights.  Make sure that you have a light by your bed that is easy to reach.  Do not use any sheets or blankets that are too big for your bed. They should not hang down onto the floor.  Have a firm chair that has side arms. You can use this for support while you get dressed.  Do not have throw rugs and other things on the floor that can make you trip. What can I do in the kitchen?  Clean up any spills right away.  Avoid walking on wet floors.  Keep items that you use a lot in easy-to-reach places.  If you need to reach something above you, use a strong step stool that has a grab bar.  Keep electrical cords out of the way.  Do not use floor polish  or wax that makes floors slippery. If you must use wax, use non-skid floor wax.  Do not have throw rugs and other things on the floor that can make you trip. What can I do with my stairs?  Do not leave any items on the stairs.  Make sure that there are handrails on both sides of the stairs and use them. Fix handrails that are broken or loose. Make sure that handrails are as long as the stairways.  Check any carpeting to make sure that it is firmly attached to the stairs. Fix any carpet that is loose or worn.  Avoid having throw rugs at the top or bottom of the stairs. If you do have throw rugs, attach them to the floor with carpet tape.  Make sure that you have a light switch at the top of the stairs and the bottom of the stairs. If you do not have them, ask someone to add them for you. What else can I do to help prevent falls?  Wear shoes  that:  Do not have high heels.  Have rubber bottoms.  Are comfortable and fit you well.  Are closed at the toe. Do not wear sandals.  If you use a stepladder:  Make sure that it is fully opened. Do not climb a closed stepladder.  Make sure that both sides of the stepladder are locked into place.  Ask someone to hold it for you, if possible.  Clearly mark and make sure that you can see:  Any grab bars or handrails.  First and last steps.  Where the edge of each step is.  Use tools that help you move around (mobility aids) if they are needed. These include:  Canes.  Walkers.  Scooters.  Crutches.  Turn on the lights when you go into a dark area. Replace any light bulbs as soon as they burn out.  Set up your furniture so you have a clear path. Avoid moving your furniture around.  If any of your floors are uneven, fix them.  If there are any pets around you, be aware of where they are.  Review your medicines with your doctor. Some medicines can make you feel dizzy. This can increase your chance of falling. Ask your doctor what other things that you can do to help prevent falls. This information is not intended to replace advice given to you by your health care provider. Make sure you discuss any questions you have with your health care provider. Document Released: 05/18/2009 Document Revised: 12/28/2015 Document Reviewed: 08/26/2014 Elsevier Interactive Patient Education  2017 [employer].

## 2020-11-14 ENCOUNTER — Other Ambulatory Visit: Payer: Self-pay

## 2020-11-14 DIAGNOSIS — D509 Iron deficiency anemia, unspecified: Secondary | ICD-10-CM

## 2020-11-14 NOTE — Telephone Encounter (Signed)
Patient called and stated that he needed a refill on all of his medications to be sent to [facility].   Pharmacy requests refill on: Allopurinol 100 mg & Omeprazole 20 mg  LAST REFILL: 11/25/2019 (Q-90, R-3) LAST OV: 05/03/2020  NEXT OV: Not Scheduled  PHARMACY: [place] requests refill on: Amlodipine 10 mg  LAST REFILL: 11/08/2019 (Q-90, R-2) LAST OV: 05/03/2020 NEXT OV: Not Scheduled  PHARMACY: [place] requests refill on: Celecoxib 200 mg   LAST REFILL: 05/18/2020 (Q-90, R-1) LAST OV: 05/03/2020 NEXT OV: Not Scheduled  PHARMACY: [place] requests refill on: Cetrizine 10 mg   LAST REFILL: 10/22/2019 (Q-90, R-3) LAST OV: 05/03/2020 NEXT OV: Not Scheduled  PHARMACY: [place] requests refill on: Ferrous Sulfate 325 mg   LAST REFILL: 09/06/2019 (Q-30, R-3) LAST OV: 05/03/2020 NEXT OV: Not Scheduled  PHARMACY: [place] requests refill on: Metformin 500 mg   LAST REFILL: 11/08/2019 (Q-180, R-2) LAST OV: 05/03/2020 NEXT OV: Not Scheduled  PHARMACY: [facility]  Hgb A1C (08/31/2019): 6.2

## 2020-11-15 MED ORDER — OMEPRAZOLE 20 MG PO CPDR: 1.0000 | DELAYED_RELEASE_CAPSULE | Freq: Every day | ORAL | 3 refills | Status: DC

## 2020-11-15 MED ORDER — METFORMIN HCL 500 MG PO TABS: 500.0000 mg | ORAL_TABLET | Freq: Two times a day (BID) | ORAL | 2 refills | Status: DC

## 2020-11-15 MED ORDER — IRON 325 (65 FE) MG PO TABS: 1.0000 | ORAL_TABLET | Freq: Every day | ORAL | 3 refills | Status: AC

## 2020-11-15 MED ORDER — CETIRIZINE HCL 10 MG PO TABS: 10.0000 mg | ORAL_TABLET | Freq: Every day | ORAL | 3 refills | Status: DC

## 2020-11-15 MED ORDER — ALLOPURINOL 100 MG PO TABS: 100.0000 mg | ORAL_TABLET | Freq: Every day | ORAL | 3 refills | Status: DC

## 2020-11-15 MED ORDER — AMLODIPINE BESYLATE 10 MG PO TABS: 10.0000 mg | ORAL_TABLET | Freq: Every day | ORAL | 2 refills | Status: DC

## 2020-11-15 MED ORDER — CELECOXIB 200 MG PO CAPS: 200.0000 mg | ORAL_CAPSULE | Freq: Every day | ORAL | 1 refills | Status: DC

## 2020-12-13 ENCOUNTER — Other Ambulatory Visit: Payer: Self-pay

## 2020-12-13 MED ORDER — METFORMIN HCL 500 MG PO TABS: 500.0000 mg | ORAL_TABLET | Freq: Two times a day (BID) | ORAL | 2 refills | Status: DC

## 2021-01-04 ENCOUNTER — Encounter: Payer: Self-pay | Admitting: Podiatry

## 2021-01-04 ENCOUNTER — Ambulatory Visit: Payer: Medicare HMO | Admitting: Podiatry

## 2021-01-04 ENCOUNTER — Other Ambulatory Visit: Payer: Self-pay

## 2021-01-04 DIAGNOSIS — M79675 Pain in left toe(s): Secondary | ICD-10-CM | POA: Diagnosis not present

## 2021-01-04 DIAGNOSIS — B351 Tinea unguium: Secondary | ICD-10-CM | POA: Diagnosis not present

## 2021-01-04 DIAGNOSIS — M79674 Pain in right toe(s): Secondary | ICD-10-CM | POA: Diagnosis not present

## 2021-01-04 DIAGNOSIS — R7303 Prediabetes: Secondary | ICD-10-CM | POA: Diagnosis not present

## 2021-01-04 NOTE — Progress Notes (Signed)
This patient returns to my office for at risk foot care.  This patient requires this care by a professional since this patient will be at risk due to having diabetes according to patient.  This patient is unable to cut nails himself since the patient cannot reach his nails.These nails are painful walking and wearing shoes.  This patient presents for at risk foot care today.  General Appearance  Alert, conversant and in no acute stress.  Vascular  Dorsalis pedis and posterior tibial  pulses are weakly  palpable  bilaterally.  Capillary return is within normal limits  bilaterally. Temperature is within normal limits  bilaterally.  Neurologic  Senn-Weinstein monofilament wire test within normal limits  bilaterally. Muscle power within normal limits bilaterally.  Nails Thick disfigured discolored nails with subungual debris  from hallux to fifth toes bilaterally. No evidence of bacterial infection or drainage bilaterally.  Orthopedic  No limitations of motion  feet .  No crepitus or effusions noted.  No bony pathology or digital deformities noted.  Skin  normotropic skin with no porokeratosis noted bilaterally.  No signs of infections or ulcers noted.     Onychomycosis  Pain in right toes  Pain in left toes  Consent was obtained for treatment procedures.   Mechanical debridement of nails 1-5  bilaterally performed with a nail nipper.  Filed with dremel without incident.    Return office visit    3 months                  Told patient to return for periodic foot care and evaluation due to potential at risk complications.   Shakyla Nolley DPM  

## 2021-02-06 ENCOUNTER — Telehealth: Payer: Self-pay | Admitting: *Deleted

## 2021-02-06 NOTE — Chronic Care Management (AMB) (Signed)
  Chronic Care Management   Note  02/06/2021 Name: Jeremy Andrews MRN: [id number] DOB: [birth year]  Jeremy Andrews is a 65 y.o. year old male who is a primary care patient of Einar Pheasant, Jobe Marker, MD. I reached out to Gaynelle Arabian by phone today in response to a referral sent by Jeremy Andrews's PCP Lesleigh Noe, MD     Jeremy Andrews was given information about Chronic Care Management services today including:  CCM service includes personalized support from designated clinical staff supervised by his physician, including individualized plan of care and coordination with other care providers 24/7 contact phone numbers for assistance for urgent and routine care needs. Service will only be billed when office clinical staff spend 20 minutes or more in a month to coordinate care. Only one practitioner may furnish and bill the service in a calendar month. The patient may stop CCM services at any time (effective at the end of the month) by phone call to the office staff. The patient will be responsible for cost sharing (co-pay) of up to 20% of the service fee (after annual deductible is met).  Patient agreed to services and verbal consent obtained.   Follow up plan: Telephone appointment with care management team member scheduled for:02/16/2021  Julian Hy, [place] [facility]  Direct Dial: [phone]

## 2021-02-09 ENCOUNTER — Telehealth: Payer: Self-pay | Admitting: Family Medicine

## 2021-02-13 NOTE — Telephone Encounter (Signed)
Left voice message to call the office  

## 2021-02-16 ENCOUNTER — Ambulatory Visit (INDEPENDENT_AMBULATORY_CARE_PROVIDER_SITE_OTHER): Payer: Medicare HMO

## 2021-02-16 DIAGNOSIS — I10 Essential (primary) hypertension: Secondary | ICD-10-CM | POA: Diagnosis not present

## 2021-02-16 DIAGNOSIS — E782 Mixed hyperlipidemia: Secondary | ICD-10-CM

## 2021-02-16 NOTE — Chronic Care Management (AMB) (Signed)
Chronic Care Management   CCM RN Visit Note  02/16/2021 Name: Jeremy Andrews MRN: [id number] DOB: 04-08-1956  Subjective: Jeremy Andrews is a 65 y.o. year old male who is a primary care patient of Jeremy Andrews, Jeremy Marker, MD. The care management team was consulted for assistance with disease management and care coordination needs.    Engaged with patient by telephone for initial visit in response to provider referral for case management and/or care coordination services.   Consent to Services:  The patient was given the following information about Chronic Care Management services today, agreed to services, and gave verbal consent: 1. CCM service includes personalized support from designated clinical staff supervised by the primary care provider, including individualized plan of care and coordination with other care providers 2. 24/7 contact phone numbers for assistance for urgent and routine care needs. 3. Service will only be billed when office clinical staff spend 20 minutes or more in a month to coordinate care. 4. Only one practitioner may furnish and bill the service in a calendar month. 5.The patient may stop CCM services at any time (effective at the end of the month) by phone call to the office staff. 6. The patient will be responsible for cost sharing (co-pay) of up to 20% of the service fee (after annual deductible is met). Patient agreed to services and consent obtained.  Patient agreed to services and verbal consent obtained.   Assessment: Review of patient past medical history, allergies, medications, health status, including review of consultants reports, laboratory and other test data, was performed as part of comprehensive evaluation and provision of chronic care management services.   SDOH (Social Determinants of Health) assessments and interventions performed:  SDOH Interventions    Flowsheet Row Most Recent Value  SDOH Interventions   Food Insecurity Interventions Intervention Not  Indicated  Housing Interventions Intervention Not Indicated  Transportation Interventions Intervention Not Indicated        CCM Care Plan  Allergies  Allergen Reactions   Lisinopril Swelling   Statins     Severe myopathy, in wheel chair Severe myopathy, in wheel chair    Outpatient Encounter Medications as of 02/16/2021  Medication Sig Note   allopurinol (ZYLOPRIM) 100 MG tablet Take 1 tablet (100 mg total) by mouth daily.    amLODipine (NORVASC) 10 MG tablet Take 1 tablet (10 mg total) by mouth daily.    celecoxib (CELEBREX) 200 MG capsule Take 1 capsule (200 mg total) by mouth daily. 02/16/2021: Patient states he takes as needed.    cetirizine (ZYRTEC) 10 MG tablet Take 1 tablet (10 mg total) by mouth daily.    Ferrous Sulfate (IRON) 325 (65 Fe) MG TABS Take 1 tablet (325 mg total) by mouth daily.    labetalol (NORMODYNE) 100 MG tablet TAKE 1/2 TABLET TWICE DAILY    metFORMIN (GLUCOPHAGE) 500 MG tablet Take 1 tablet (500 mg total) by mouth 2 (two) times daily with a meal.    omeprazole (PRILOSEC) 20 MG capsule Take 1 capsule (20 mg total) by mouth daily.    No facility-administered encounter medications on file as of 02/16/2021.    Patient Active Problem List   Diagnosis Date Noted   Myalgia due to statin 05/03/2020   Microcytic anemia 11/01/2019   Dyspnea on exertion 08/31/2019   Pain due to onychomycosis of toenails of both feet 02/18/2019   Essential hypertension 02/09/2019   Prediabetes 02/09/2019   Hyperlipidemia 02/09/2019   GERD (gastroesophageal reflux disease) 02/09/2019   Gout 02/09/2019  Environmental allergies 02/09/2019    Conditions to be addressed/monitored:HTN and HLD  Care Plan : Cardiovascular  Updates made by Dannielle Karvonen, RN since 02/16/2021 12:00 AM   Problem: Disease Progression (Hypertension/ Hyperlipidemia)   Priority: Medium   Long-Range Goal: Disease Progression Prevented or Minimized   Start Date: 02/16/2021  Expected End Date:  05/04/2021  This Visit's Progress: On track  Priority: Medium  Objective:  Last practice recorded BP readings:  BP Readings from Last 3 Encounters:  05/03/20 110/76  11/01/19 140/82  08/31/19 140/86      Component Value Date/Time   CHOL 171 08/31/2019 1104   TRIG 85.0 08/31/2019 1104   HDL 41.20 08/31/2019 1104   CHOLHDL 4 08/31/2019 1104   VLDL 17.0 08/31/2019 1104   LDLCALC 113 (H) 08/31/2019 1104  Current Barriers:  65 year old with history of HTN, Hyperlipidemia and idopathic gout.  Patient states he has a blood pressure monitor but does not check his blood pressure at home. He states he is not sure he knows how.   Patient states he has a niece who is a Equities trader who could help if needed and also his wife.  Patient states he receives his medications through [facility] mail order.  He reports he is able to afford them and takes his medication as prescribed. Patient states for the last 6 months he has tried to be more active  with walking and yard work.   Knowledge deficit related to long term care plan for self management of Hypertension and Hyperlipidemia Case Manager Clinical Goal(s):  patient will verbalize understanding of plan for hypertension management patient will attend scheduled medical appointments patient will demonstrate improved adherence to prescribed treatment plan for hypertension as evidenced by taking all medications as prescribed, monitoring and recording blood pressure , adhering to low sodium/DASH diet patient will verbalize basic understanding of hypertension/ hyperlipidemia disease process  Interventions:  Collaboration with Lesleigh Noe, MD regarding development and update of comprehensive plan of care as evidenced by provider attestation and co-signature Inter-disciplinary care team collaboration (see longitudinal plan of care) Reviewed medications with patient and discussed importance of compliance Discussed plans with patient for ongoing care  management follow up and provided patient with direct contact information for care management team Advised patient, providing education and rationale, to monitor blood pressure at least 1 time per week and record and call PCP for findings outside established parameters.  Reviewed scheduled/upcoming provider appointments including  Self-Care Activities: - Self administers medications as prescribed Attends all scheduled provider appointments Calls provider office for new concerns, questions, or BP outside discussed parameters Checks BP and records as discussed Follows a low sodium diet/DASH diet Patient Goals: - check blood pressure at least 1 -2 times per week and record - Follow up with your doctor as recommended. - take your medications as prescribed. - If tolerated, try to be active everyday - Review education article on Hypertension management and low salt/ dash diet sent to you in the mail by your RN case manager - Ways to lower your cholesterol ( follow a heart healthy diet, get regular exercise, reach and maintain a healthy weight).  Review article on heart healthy diet.  Follow Up Plan: The patient has been provided with contact information for the care management team and has been advised to call with any health related questions or concerns.  The care management team will reach out to the patient again over the next 45 days.  Plan:The patient has been provided with contact information for the care management team and has been advised to call with any health related questions or concerns.  and The care management team will reach out to the patient again over the next 45 days. Quinn Plowman RN,BSN,CCM RN Case Manager [place]  [phone]

## 2021-02-16 NOTE — Patient Instructions (Addendum)
Visit Information:  Thank you for taking the time to speak with me today.   PATIENT GOALS:   Goals Addressed             This Visit's Progress    Lifestyle Change-Hypertension/ Hyperlipidemia   On track    Timeframe:  Long-Range Goal Priority:  Medium Start Date:     02/16/2021                        Expected End Date:   05/04/2021                    Follow Up Date 03/29/2021    - check blood pressure at least 1 -2 times per week and record - Follow up with your doctor as recommended. - take your medications as prescribed. - If tolerated, try to be active everyday - Review education article on Hypertension management and low salt/ dash diet sent to you in the mail by your RN case manager - Ways to lower your cholesterol ( follow a heart healthy diet, get regular exercise, reach and maintain a healthy weight).  Review article on heart healthy diet.    Why is this important?   The changes that you are asked to make may be hard to do.  This is especially true when the changes are life-long.  Knowing why it is important to you is the first step.  Working on the change with your family or support person helps you not feel alone.  Reward yourself and family or support person when goals are met. This can be an activity you choose like bowling, hiking, biking, swimming or shooting hoops.             Consent to CCM Services: Mr. Hogeland was given information about Chronic Care Management services today including:  CCM service includes personalized support from designated clinical staff supervised by his physician, including individualized plan of care and coordination with other care providers 24/7 contact phone numbers for assistance for urgent and routine care needs. Service will only be billed when office clinical staff spend 20 minutes or more in a month to coordinate care. Only one practitioner may furnish and bill the service in a calendar month. The patient may stop CCM  services at any time (effective at the end of the month) by phone call to the office staff. The patient will be responsible for cost sharing (co-pay) of up to 20% of the service fee (after annual deductible is met).  Patient agreed to services and verbal consent obtained.   The patient verbalized understanding of instructions, educational materials, and care plan provided today and agreed to receive a mailed copy of patient instructions, educational materials, and care plan.   The patient has been provided with contact information for the care management team and has been advised to call with any health related questions or concerns.  The care management team will reach out to the patient again over the next 45 days.   Quinn Plowman RN,BSN,CCM RN Case Manager [place]  [phone]   CLINICAL CARE PLAN: Patient Care Plan: Cardiovascular   Problem Identified: Disease Progression (Hypertension/ Hyperlipidemia)   Priority: Medium   Long-Range Goal: Disease Progression Prevented or Minimized   Start Date: 02/16/2021  Expected End Date: 05/04/2021  This Visit's Progress: On track  Priority: Medium  Objective:  Last practice recorded BP readings:  BP Readings from Last 3 Encounters:  05/03/20 110/76  11/01/19 140/82  08/31/19 140/86      Component Value Date/Time   CHOL 171 08/31/2019 1104   TRIG 85.0 08/31/2019 1104   HDL 41.20 08/31/2019 1104   CHOLHDL 4 08/31/2019 1104   VLDL 17.0 08/31/2019 1104   LDLCALC 113 (H) 08/31/2019 1104  Current Barriers:  65 year old with history of HTN, Hyperlipidemia and idopathic gout.  Patient states he has a blood pressure monitor but does not check his blood pressure at home. He states he is not sure he knows how.   Patient states he has a niece who is a Equities trader who could help if needed and also his wife.  Patient states he receives his medications through [facility] mail order.  He reports he is able to afford them and takes his  medication as prescribed. Patient states for the last 6 months he has tried to be more active  with walking and yard work.   Knowledge deficit related to long term care plan for self management of Hypertension and Hyperlipidemia Case Manager Clinical Goal(s):  patient will verbalize understanding of plan for hypertension management patient will attend scheduled medical appointments patient will demonstrate improved adherence to prescribed treatment plan for hypertension as evidenced by taking all medications as prescribed, monitoring and recording blood pressure , adhering to low sodium/DASH diet patient will verbalize basic understanding of hypertension/ hyperlipidemia disease process  Interventions:  Collaboration with Lesleigh Noe, MD regarding development and update of comprehensive plan of care as evidenced by provider attestation and co-signature Inter-disciplinary care team collaboration (see longitudinal plan of care) Reviewed medications with patient and discussed importance of compliance Discussed plans with patient for ongoing care management follow up and provided patient with direct contact information for care management team Advised patient, providing education and rationale, to monitor blood pressure at least 1 time per week and record and call PCP for findings outside established parameters.  Reviewed scheduled/upcoming provider appointments  RNCM will send patient Advance directive as discussed.  Self-Care Activities: - Self administers medications as prescribed Attends all scheduled provider appointments Calls provider office for new concerns, questions, or BP outside discussed parameters Checks BP and records as discussed Follows a low sodium diet/DASH diet Patient Goals: - check blood pressure at least 1 -2 times per week and record - Follow up with your doctor as recommended. - take your medications as prescribed. - If tolerated, try to be active everyday - Review  education article on Hypertension management and low salt/ dash diet sent to you in the mail by your RN case manager - Ways to lower your cholesterol ( follow a heart healthy diet, get regular exercise, reach and maintain a healthy weight).  Review article on heart healthy diet.  -Review information mailed to you on Advance directive. Administrator, Civil Service your RN case manager if you have question) Follow Up Plan: The patient has been provided with contact information for the care management team and has been advised to call with any health related questions or concerns.  The care management team will reach out to the patient again over the next 45 days.

## 2021-02-19 NOTE — Telephone Encounter (Signed)
Spoke with patient scheduled [place]

## 2021-03-08 ENCOUNTER — Encounter: Payer: Medicare HMO | Admitting: Family Medicine

## 2021-03-26 IMAGING — DX DG CHEST 2V
2 series · 2 of 2 positions shown · non-contrast
Comparison: None.

CLINICAL DATA: Dyspnea on exertion

EXAM:
CHEST - 2 VIEW

[chest pa]
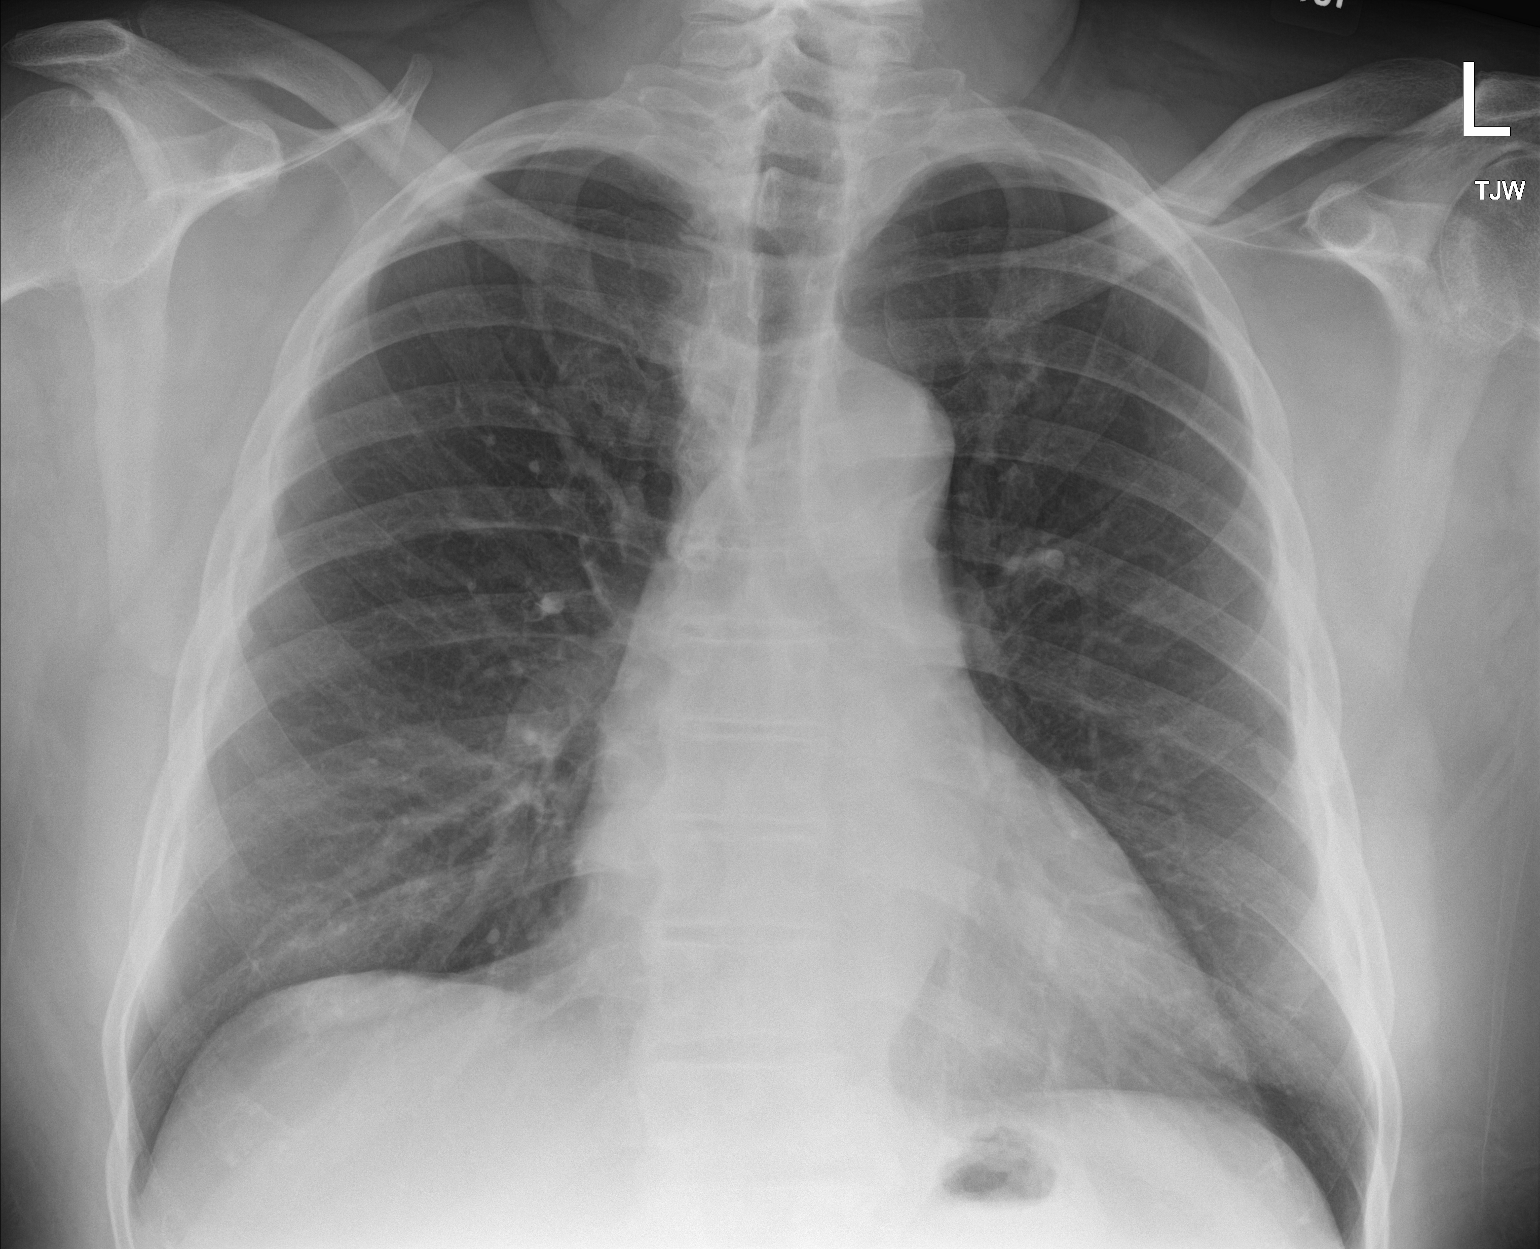

[chest lat]
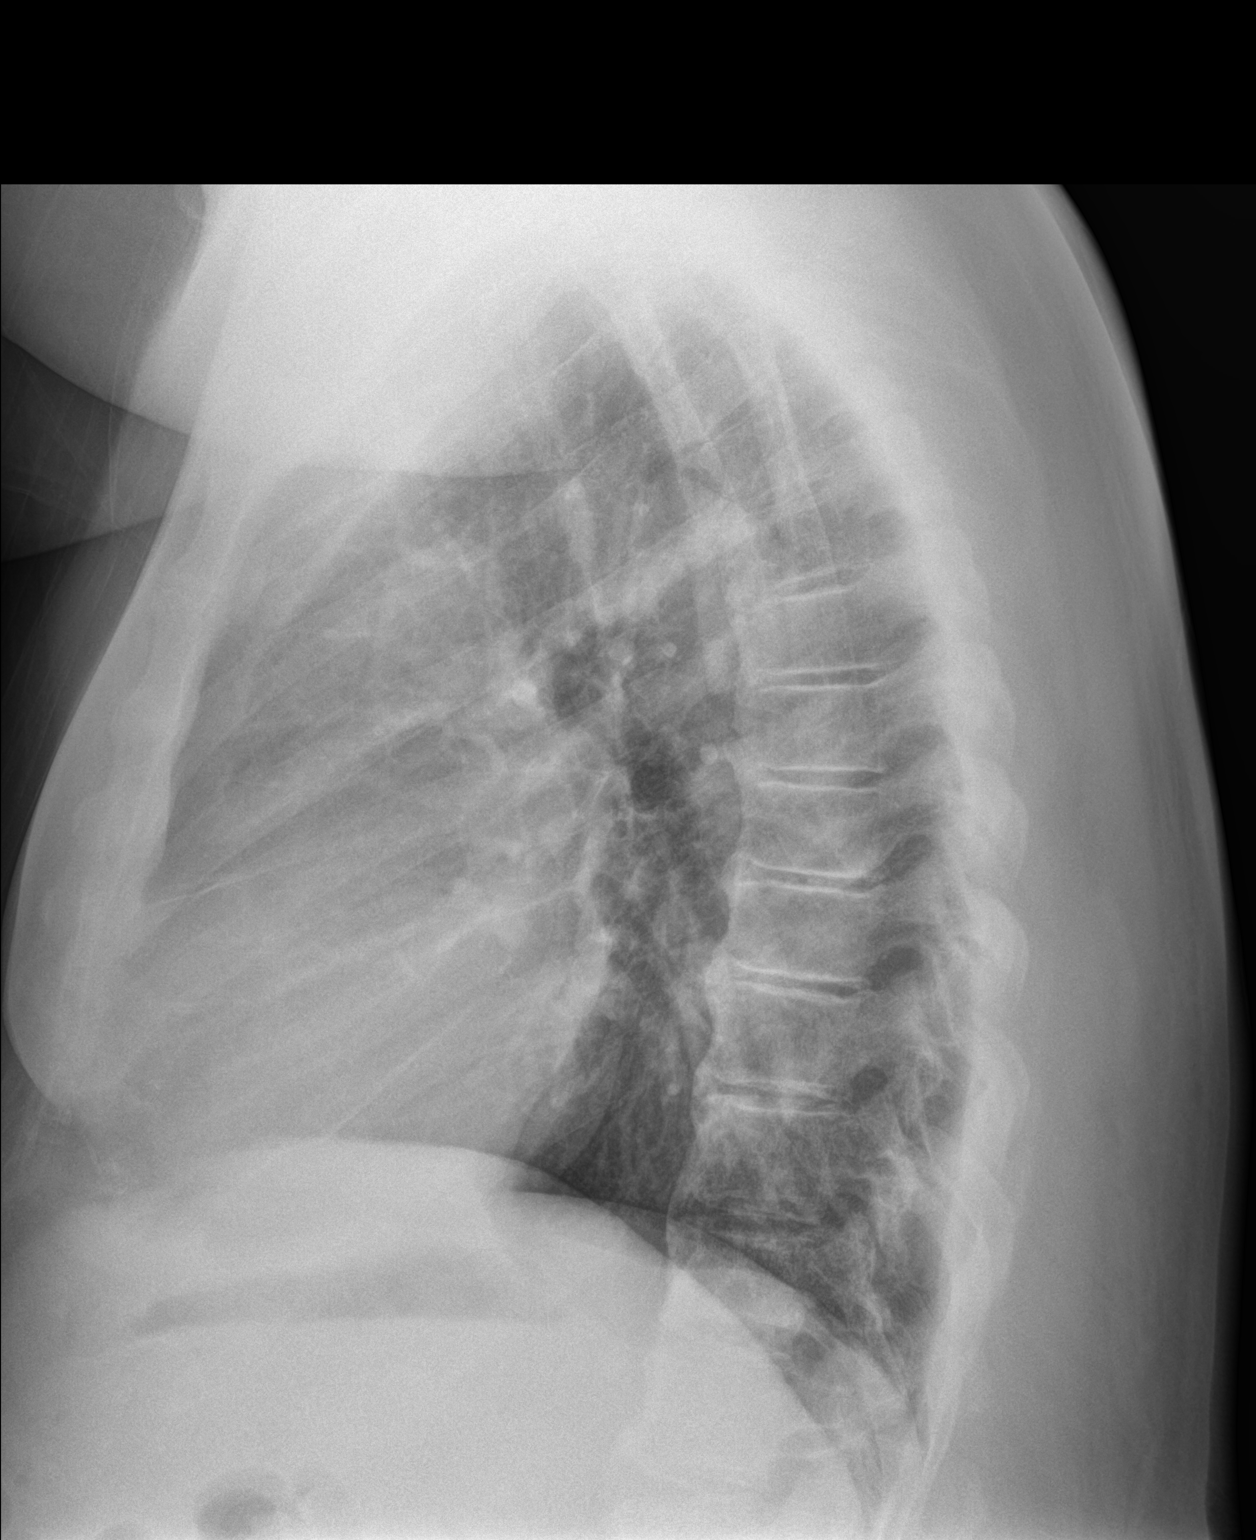

[2 of 2 positions shown; findings below may reference images not displayed]

FINDINGS: The heart size and mediastinal contours are within normal limits. No
focal airspace consolidation, pleural effusion, or pneumothorax. The
visualized skeletal structures are unremarkable.
IMPRESSION: No active cardiopulmonary disease.

## 2021-03-29 ENCOUNTER — Ambulatory Visit (INDEPENDENT_AMBULATORY_CARE_PROVIDER_SITE_OTHER): Payer: Medicare HMO

## 2021-03-29 DIAGNOSIS — I10 Essential (primary) hypertension: Secondary | ICD-10-CM | POA: Diagnosis not present

## 2021-03-29 DIAGNOSIS — E785 Hyperlipidemia, unspecified: Secondary | ICD-10-CM | POA: Diagnosis not present

## 2021-03-29 NOTE — Patient Instructions (Signed)
Visit Information:  Thank you for taking the time to speak with me today  PATIENT GOALS:  Goals Addressed             This Visit's Progress    Lifestyle Change-Hypertension/ Hyperlipidemia   On track    Timeframe:  Long-Range Goal Priority:  Medium Start Date:     02/16/2021                        Expected End Date:   07/04/2021                    Follow Up Date 05/18/2021   -Call primary care provider office to rescheduled your appointment.  - check blood pressure at least 1 -2 times per week and record - Follow up with your doctor as recommended. - Continue to take your medications as prescribed. - If tolerated, try to be active everyday - continue to follow  a low salt diet.  - Ways to lower your cholesterol ( follow a heart healthy diet, get regular exercise, reach and maintain a healthy weight).     Why is this important?   The changes that you are asked to make may be hard to do.  This is especially true when the changes are life-long.  Knowing why it is important to you is the first step.  Working on the change with your family or support person helps you not feel alone.  Reward yourself and family or support person when goals are met. This can be an activity you choose like bowling, hiking, biking, swimming or shooting hoops.             The patient verbalized understanding of instructions, educational materials, and care plan provided today and agreed to receive a mailed copy of patient instructions, educational materials, and care plan.   The patient has been provided with contact information for the care management team and has been advised to call with any health related questions or concerns.  The care management team will reach out to the patient again over the next 2 months.   Quinn Plowman RN,BSN,CCM RN Case Manager [place]  [phone]

## 2021-03-29 NOTE — Chronic Care Management (AMB) (Signed)
Chronic Care Management   CCM RN Visit Note  03/29/2021 Name: Jeremy Andrews MRN: [id number] DOB: 18-Feb-1956  Subjective: Jeremy Andrews is a 65 y.o. year old male who is a primary care patient of Einar Pheasant, Jobe Marker, MD. The care management team was consulted for assistance with disease management and care coordination needs.    Engaged with patient by telephone for follow up visit in response to provider referral for case management and/or care coordination services.   Consent to Services:  The patient was given information about Chronic Care Management services, agreed to services, and gave verbal consent prior to initiation of services.  Please see initial visit note for detailed documentation.   Patient agreed to services and verbal consent obtained.   Assessment: Review of patient past medical history, allergies, medications, health status, including review of consultants reports, laboratory and other test data, was performed as part of comprehensive evaluation and provision of chronic care management services.   SDOH (Social Determinants of Health) assessments and interventions performed:    CCM Care Plan  Allergies  Allergen Reactions   Lisinopril Swelling   Statins     Severe myopathy, in wheel chair Severe myopathy, in wheel chair    Outpatient Encounter Medications as of 03/29/2021  Medication Sig Note   allopurinol (ZYLOPRIM) 100 MG tablet Take 1 tablet (100 mg total) by mouth daily.    amLODipine (NORVASC) 10 MG tablet Take 1 tablet (10 mg total) by mouth daily.    celecoxib (CELEBREX) 200 MG capsule Take 1 capsule (200 mg total) by mouth daily. 02/16/2021: Patient states he takes as needed.    cetirizine (ZYRTEC) 10 MG tablet Take 1 tablet (10 mg total) by mouth daily.    Ferrous Sulfate (IRON) 325 (65 Fe) MG TABS Take 1 tablet (325 mg total) by mouth daily.    labetalol (NORMODYNE) 100 MG tablet TAKE 1/2 TABLET TWICE DAILY    metFORMIN (GLUCOPHAGE) 500 MG tablet Take 1  tablet (500 mg total) by mouth 2 (two) times daily with a meal.    omeprazole (PRILOSEC) 20 MG capsule Take 1 capsule (20 mg total) by mouth daily.    No facility-administered encounter medications on file as of 03/29/2021.    Patient Active Problem List   Diagnosis Date Noted   Myalgia due to statin 05/03/2020   Microcytic anemia 11/01/2019   Dyspnea on exertion 08/31/2019   Pain due to onychomycosis of toenails of both feet 02/18/2019   Essential hypertension 02/09/2019   Prediabetes 02/09/2019   Hyperlipidemia 02/09/2019   GERD (gastroesophageal reflux disease) 02/09/2019   Gout 02/09/2019   Environmental allergies 02/09/2019    Conditions to be addressed/monitored:HTN and HLD  Care Plan : Cardiovascular  Updates made by Dannielle Karvonen, RN since 03/29/2021 12:00 AM     Problem: Disease Progression (Hypertension/ Hyperlipidemia)   Priority: Medium     Long-Range Goal: Disease Progression Prevented or Minimized   Start Date: 02/16/2021  Expected End Date: 07/04/2021  This Visit's Progress: On track  Recent Progress: On track  Priority: Medium  Note:   Objective:  Last practice recorded BP readings:  BP Readings from Last 3 Encounters:  05/03/20 110/76  11/01/19 140/82  08/31/19 140/86      Component Value Date/Time   CHOL 171 08/31/2019 1104   TRIG 85.0 08/31/2019 1104   HDL 41.20 08/31/2019 1104   CHOLHDL 4 08/31/2019 1104   VLDL 17.0 08/31/2019 1104   LDLCALC 113 (H) 08/31/2019 1104  Current Barriers:  Patient states he is doing well.  Denies any new or ongoing symptoms. He states he has not been checking his blood pressure.  Patient encouraged to check blood pressure at least 1 time per week.  Encouraged patient to ask his niece who is a Marine scientist and or wife to assist his with checking blood pressure.  Patient states he was scheduled to follow up with his primary care provider but had to cancel due to death in the family. He states he will call office to reschedule.      Knowledge deficit related to long term care plan for self management of Hypertension and Hyperlipidemia Case Manager Clinical Goal(s):  patient will verbalize understanding of plan for hypertension management patient will attend scheduled medical appointments patient will demonstrate improved adherence to prescribed treatment plan for hypertension as evidenced by taking all medications as prescribed, monitoring and recording blood pressure , adhering to low sodium/DASH diet patient will verbalize basic understanding of hypertension/ hyperlipidemia disease process  Interventions:  Collaboration with Lesleigh Noe, MD regarding development and update of comprehensive plan of care as evidenced by provider attestation and co-signature Inter-disciplinary care team collaboration (see longitudinal plan of care) Reviewed medications with patient and discussed importance of compliance Discussed plans with patient for ongoing care management follow up and provided patient with direct contact information for care management team Advised patient, providing education and rationale, to monitor blood pressure at least 1 time per week and record and call PCP for findings outside established parameters.  Reviewed scheduled/upcoming provider appointments:  Patient scheduled to have podiatry appointment 04/12/2021 RNCM will send patient Advance directive as discussed Self-Care Activities: - Self administers medications as prescribed Attends all scheduled provider appointments Calls provider office for new concerns, questions, or BP outside discussed parameters Checks BP and records as discussed Follows a low sodium diet/DASH diet Patient Goals: -Call primary care provider office to rescheduled your appointment.  - check blood pressure at least 1 -2 times per week and record - Follow up with your doctor as recommended. - Continue to take your medications as prescribed. - If tolerated, try to be active  everyday - continue to follow  a low salt diet.  - Ways to lower your cholesterol ( follow a heart healthy diet, get regular exercise, reach and maintain a healthy weight).   Follow Up Plan: The patient has been provided with contact information for the care management team and has been advised to call with any health related questions or concerns.  The care management team will reach out to the patient again over the next 45 days.          Plan:The patient has been provided with contact information for the care management team and has been advised to call with any health related questions or concerns.  and The care management team will reach out to the patient again over the next 2 months.  Quinn Plowman RN,BSN,CCM RN Case Manager [place]  [phone]

## 2021-04-12 ENCOUNTER — Other Ambulatory Visit: Payer: Self-pay

## 2021-04-12 ENCOUNTER — Ambulatory Visit: Payer: Medicare HMO | Admitting: Podiatry

## 2021-04-12 ENCOUNTER — Encounter: Payer: Self-pay | Admitting: Podiatry

## 2021-04-12 DIAGNOSIS — M79674 Pain in right toe(s): Secondary | ICD-10-CM

## 2021-04-12 DIAGNOSIS — B351 Tinea unguium: Secondary | ICD-10-CM

## 2021-04-12 DIAGNOSIS — M79675 Pain in left toe(s): Secondary | ICD-10-CM

## 2021-04-12 DIAGNOSIS — R7303 Prediabetes: Secondary | ICD-10-CM

## 2021-04-12 NOTE — Progress Notes (Signed)
This patient returns to my office for at risk foot care.  This patient requires this care by a professional since this patient will be at risk due to having diabetes according to patient.  This patient is unable to cut nails himself since the patient cannot reach his nails.These nails are painful walking and wearing shoes.  This patient presents for at risk foot care today.  General Appearance  Alert, conversant and in no acute stress.  Vascular  Dorsalis pedis and posterior tibial  pulses are weakly  palpable  bilaterally.  Capillary return is within normal limits  bilaterally. Temperature is within normal limits  bilaterally.  Neurologic  Senn-Weinstein monofilament wire test within normal limits  bilaterally. Muscle power within normal limits bilaterally.  Nails Thick disfigured discolored nails with subungual debris  from hallux to fifth toes bilaterally. No evidence of bacterial infection or drainage bilaterally.  Orthopedic  No limitations of motion  feet .  No crepitus or effusions noted.  No bony pathology or digital deformities noted.  Skin  normotropic skin with no porokeratosis noted bilaterally.  No signs of infections or ulcers noted.     Onychomycosis  Pain in right toes  Pain in left toes  Consent was obtained for treatment procedures.   Mechanical debridement of nails 1-5  bilaterally performed with a nail nipper.  Filed with dremel without incident.    Return office visit    3 months                  Told patient to return for periodic foot care and evaluation due to potential at risk complications.   Tia Gelb DPM  

## 2021-05-18 ENCOUNTER — Telehealth: Payer: Medicare HMO

## 2021-05-21 ENCOUNTER — Ambulatory Visit (INDEPENDENT_AMBULATORY_CARE_PROVIDER_SITE_OTHER): Payer: Medicare HMO

## 2021-05-21 DIAGNOSIS — E785 Hyperlipidemia, unspecified: Secondary | ICD-10-CM

## 2021-05-21 DIAGNOSIS — I10 Essential (primary) hypertension: Secondary | ICD-10-CM

## 2021-05-21 NOTE — Patient Instructions (Signed)
Visit Information:  Thank you for taking the time to speak with me today.   PATIENT GOALS:  Goals Addressed             This Visit's Progress    Lifestyle Change-Hypertension/ Hyperlipidemia   On track    Timeframe:  Long-Range Goal Priority:  Medium Start Date:     02/16/2021                        Expected End Date:   10/02/2020                 Follow Up Date 07/31/2021  -Call primary care provider office to schedule physical  - continue to monitor blood pressure at least 1 -2 times per week and record - Follow up with your doctor as recommended. - Continue to take your medications as prescribed. - If tolerated, try to be active everyday - continue to follow  a low salt diet.  - Ways to lower your cholesterol ( follow a heart healthy diet, get regular exercise, reach and maintain a healthy weight).     Why is this important?   The changes that you are asked to make may be hard to do.  This is especially true when the changes are life-long.  Knowing why it is important to you is the first step.  Working on the change with your family or support person helps you not feel alone.  Reward yourself and family or support person when goals are met. This can be an activity you choose like bowling, hiking, biking, swimming or shooting hoops.             The patient verbalized understanding of instructions, educational materials, and care plan provided today and agreed to receive a mailed copy of patient instructions, educational materials, and care plan.   The patient has been provided with contact information for the care management team and has been advised to call with any health related questions or concerns.  The care management team will reach out to the patient again over the next 2 months.   Quinn Plowman RN,BSN,CCM RN Case Manager [place]  [phone]

## 2021-05-21 NOTE — Chronic Care Management (AMB) (Signed)
Chronic Care Management   CCM RN Visit Note  05/21/2021 Name: Jeremy Andrews MRN: [id number] DOB: 1955/08/24  Subjective: Jeremy Andrews is a 65 y.o. year old male who is a primary care patient of Einar Pheasant, Jobe Marker, MD. The care management team was consulted for assistance with disease management and care coordination needs.    Engaged with patient by telephone for follow up visit in response to provider referral for case management and/or care coordination services.   Consent to Services:  The patient was given information about Chronic Care Management services, agreed to services, and gave verbal consent prior to initiation of services.  Please see initial visit note for detailed documentation.   Patient agreed to services and verbal consent obtained.   Assessment: Review of patient past medical history, allergies, medications, health status, including review of consultants reports, laboratory and other test data, was performed as part of comprehensive evaluation and provision of chronic care management services.   SDOH (Social Determinants of Health) assessments and interventions performed:    CCM Care Plan  Allergies  Allergen Reactions   Lisinopril Swelling   Statins     Severe myopathy, in wheel chair Severe myopathy, in wheel chair    Outpatient Encounter Medications as of 05/21/2021  Medication Sig Note   allopurinol (ZYLOPRIM) 100 MG tablet Take 1 tablet (100 mg total) by mouth daily.    amLODipine (NORVASC) 10 MG tablet Take 1 tablet (10 mg total) by mouth daily.    celecoxib (CELEBREX) 200 MG capsule Take 1 capsule (200 mg total) by mouth daily. 02/16/2021: Patient states he takes as needed.    cetirizine (ZYRTEC) 10 MG tablet Take 1 tablet (10 mg total) by mouth daily.    Ferrous Sulfate (IRON) 325 (65 Fe) MG TABS Take 1 tablet (325 mg total) by mouth daily.    labetalol (NORMODYNE) 100 MG tablet TAKE 1/2 TABLET TWICE DAILY    metFORMIN (GLUCOPHAGE) 500 MG tablet Take 1  tablet (500 mg total) by mouth 2 (two) times daily with a meal.    omeprazole (PRILOSEC) 20 MG capsule Take 1 capsule (20 mg total) by mouth daily.    allopurinol (ZYLOPRIM) 100 MG tablet Take 1 tablet by mouth daily.    omeprazole (PRILOSEC) 20 MG capsule Take 1 capsule by mouth daily.    No facility-administered encounter medications on file as of 05/21/2021.    Patient Active Problem List   Diagnosis Date Noted   Myalgia due to statin 05/03/2020   Microcytic anemia 11/01/2019   Dyspnea on exertion 08/31/2019   Pain due to onychomycosis of toenails of both feet 02/18/2019   Essential hypertension 02/09/2019   Prediabetes 02/09/2019   Hyperlipidemia 02/09/2019   GERD (gastroesophageal reflux disease) 02/09/2019   Gout 02/09/2019   Environmental allergies 02/09/2019    Conditions to be addressed/monitored:HTN and HLD  Care Plan : Cardiovascular  Updates made by Dannielle Karvonen, RN since 05/21/2021 12:00 AM     Problem: Disease Progression (Hypertension/ Hyperlipidemia)   Priority: Medium     Long-Range Goal: Disease Progression Prevented or Minimized   Start Date: 02/16/2021  Expected End Date: 10/02/2021  This Visit's Progress: On track  Recent Progress: On track  Priority: Medium  Note:   Objective:  Last practice recorded BP readings:  BP Readings from Last 3 Encounters:  05/03/20 110/76  11/01/19 140/82  08/31/19 140/86      Component Value Date/Time   CHOL 171 08/31/2019 1104   TRIG 85.0 08/31/2019 1104  HDL 41.20 08/31/2019 1104   CHOLHDL 4 08/31/2019 1104   VLDL 17.0 08/31/2019 1104   LDLCALC 113 (H) 08/31/2019 1104  Current Barriers:  Patient states he is doing well.  Denies any new or ongoing symptoms. Per chart review and discussion with patient, no noted recent physical for 2022.  Advised patient to call his provider office to discuss and schedule if appointment availability. Discussed importance of having physical and labs as recommended by provider.    Patient states he has checked his blood pressure since last outreach with RNCM.   Reported blood pressures are:  125/85, 117/80, AND 121/83.  Commended patient on taking blood pressures as requested.  Advised to continue to monitor at least 1 time per week.  Patient verbalized understanding and agreement.   Knowledge deficit related to long term care plan for self management of Hypertension and Hyperlipidemia Case Manager Clinical Goal(s):  patient will verbalize understanding of plan for hypertension management patient will attend scheduled medical appointments patient will demonstrate improved adherence to prescribed treatment plan for hypertension as evidenced by taking all medications as prescribed, monitoring and recording blood pressure , adhering to low sodium/DASH diet patient will verbalize basic understanding of hypertension/ hyperlipidemia disease process  Interventions:  Collaboration with Lesleigh Noe, MD regarding development and update of comprehensive plan of care as evidenced by provider attestation and co-signature Inter-disciplinary care team collaboration (see longitudinal plan of care) Reviewed medications with patient and discussed importance of compliance Discussed plans with patient for ongoing care management follow up and provided patient with direct contact information for care management team Advised patient, providing education and rationale, to monitor blood pressure at least 1 time per week and record and call PCP for findings outside established parameters.  Reviewed scheduled/upcoming provider appointments RNCM will send patient Advance directive as discussed Self-Care Activities: - Self administers medications as prescribed Attends all scheduled provider appointments Calls provider office for new concerns, questions, or BP outside discussed parameters Checks BP and records as discussed Follows a low sodium diet/DASH diet Patient Goals: -Call primary care  provider office to schedule physical  - continue to monitor blood pressure at least 1 -2 times per week and record - Follow up with your doctor as recommended. - Continue to take your medications as prescribed. - If tolerated, try to be active everyday - continue to follow  a low salt diet.  - Ways to lower your cholesterol ( follow a heart healthy diet, get regular exercise, reach and maintain a healthy weight).   Follow Up Plan: The patient has been provided with contact information for the care management team and has been advised to call with any health related questions or concerns.  The care management team will reach out to the patient again over the next 2 months.          Plan:The patient has been provided with contact information for the care management team and has been advised to call with any health related questions or concerns.  The care management team will reach out to the patient again over the next 2 months Quinn Plowman RN,BSN,CCM RN Case Manager [place]  [phone]

## 2021-05-22 DIAGNOSIS — Z5181 Encounter for therapeutic drug level monitoring: Secondary | ICD-10-CM | POA: Diagnosis not present

## 2021-05-22 DIAGNOSIS — Z79899 Other long term (current) drug therapy: Secondary | ICD-10-CM | POA: Diagnosis not present

## 2021-05-22 DIAGNOSIS — M1000 Idiopathic gout, unspecified site: Secondary | ICD-10-CM | POA: Diagnosis not present

## 2021-06-04 DIAGNOSIS — I10 Essential (primary) hypertension: Secondary | ICD-10-CM | POA: Diagnosis not present

## 2021-06-04 DIAGNOSIS — E785 Hyperlipidemia, unspecified: Secondary | ICD-10-CM | POA: Diagnosis not present

## 2021-06-07 ENCOUNTER — Telehealth: Payer: Self-pay | Admitting: Family Medicine

## 2021-06-07 NOTE — Telephone Encounter (Signed)
  Encourage patient to contact the pharmacy for refills or they can request refills through [place]:  Please schedule appointment if longer than 1 year  NEXT APPOINTMENT DATE:07/31/21  MEDICATION:amLODipine (NORVASC) 10 MG tablet  Is the patient out of medication?yes  [place], [place]  Let patient know to contact pharmacy at the end of the day to make sure medication is ready.  Please notify patient to allow 48-72 hours to process  CLINICAL FILLS OUT ALL BELOW:   LAST REFILL:  QTY:  REFILL DATE:    OTHER COMMENTS:    Okay for refill?  Please advise

## 2021-06-08 MED ORDER — AMLODIPINE BESYLATE 10 MG PO TABS: 10.0000 mg | ORAL_TABLET | Freq: Every day | ORAL | 0 refills | Status: DC

## 2021-06-08 NOTE — Telephone Encounter (Signed)
LMTCB to schedule a follow up in Feb

## 2021-06-08 NOTE — Addendum Note (Signed)
Addended by: Loreen Freud on: 06/08/2021 09:32 AM   Modules accepted: Orders

## 2021-06-08 NOTE — Telephone Encounter (Signed)
Please schedule pt for a follow-up for February when Dr. Einar Pheasant returns.

## 2021-06-11 NOTE — Telephone Encounter (Signed)
Pt stated he will call office today later or tomorrow to schedule lab/cpe he is driving

## 2021-06-26 DIAGNOSIS — E113291 Type 2 diabetes mellitus with mild nonproliferative diabetic retinopathy without macular edema, right eye: Secondary | ICD-10-CM | POA: Diagnosis not present

## 2021-06-26 DIAGNOSIS — E113292 Type 2 diabetes mellitus with mild nonproliferative diabetic retinopathy without macular edema, left eye: Secondary | ICD-10-CM | POA: Diagnosis not present

## 2021-06-26 LAB — HM DIABETES EYE EXAM

## 2021-07-02 ENCOUNTER — Other Ambulatory Visit: Payer: Self-pay | Admitting: Family Medicine

## 2021-07-03 NOTE — Telephone Encounter (Signed)
Please schedule patient for CPE with Dr Einar Pheasant for after 11/01/21-patient already is set up for labs and medicare wellness/ thank you

## 2021-07-04 NOTE — Telephone Encounter (Signed)
Pt scheduled a cpe  on 5.1.2023

## 2021-07-12 ENCOUNTER — Encounter: Payer: Self-pay | Admitting: Podiatry

## 2021-07-12 ENCOUNTER — Ambulatory Visit: Payer: Medicare HMO | Admitting: Podiatry

## 2021-07-12 ENCOUNTER — Other Ambulatory Visit: Payer: Self-pay

## 2021-07-12 DIAGNOSIS — B351 Tinea unguium: Secondary | ICD-10-CM

## 2021-07-12 DIAGNOSIS — R7303 Prediabetes: Secondary | ICD-10-CM | POA: Diagnosis not present

## 2021-07-12 DIAGNOSIS — M79674 Pain in right toe(s): Secondary | ICD-10-CM

## 2021-07-12 DIAGNOSIS — M79675 Pain in left toe(s): Secondary | ICD-10-CM

## 2021-07-12 NOTE — Progress Notes (Signed)
This patient returns to my office for at risk foot care.  This patient requires this care by a professional since this patient will be at risk due to having diabetes according to patient.  This patient is unable to cut nails himself since the patient cannot reach his nails.These nails are painful walking and wearing shoes.  This patient presents for at risk foot care today.  General Appearance  Alert, conversant and in no acute stress.  Vascular  Dorsalis pedis and posterior tibial  pulses are weakly  palpable  bilaterally.  Capillary return is within normal limits  bilaterally. Temperature is within normal limits  bilaterally.  Neurologic  Senn-Weinstein monofilament wire test within normal limits  bilaterally. Muscle power within normal limits bilaterally.  Nails Thick disfigured discolored nails with subungual debris  from hallux to fifth toes bilaterally. No evidence of bacterial infection or drainage bilaterally.  Orthopedic  No limitations of motion  feet .  No crepitus or effusions noted.  No bony pathology or digital deformities noted.  Skin  normotropic skin with no porokeratosis noted bilaterally.  No signs of infections or ulcers noted.     Onychomycosis  Pain in right toes  Pain in left toes  Consent was obtained for treatment procedures.   Mechanical debridement of nails 1-5  bilaterally performed with a nail nipper.  Filed with dremel without incident.    Return office visit    3 months                  Told patient to return for periodic foot care and evaluation due to potential at risk complications.   Journe Hallmark DPM  

## 2021-07-18 ENCOUNTER — Encounter: Payer: Self-pay | Admitting: Family Medicine

## 2021-07-21 ENCOUNTER — Other Ambulatory Visit: Payer: Self-pay | Admitting: Family Medicine

## 2021-07-25 NOTE — Telephone Encounter (Signed)
Called mr. Meyers and lvmtcb

## 2021-07-31 ENCOUNTER — Telehealth: Payer: Medicare HMO

## 2021-08-02 ENCOUNTER — Encounter: Payer: Self-pay | Admitting: Family Medicine

## 2021-08-15 ENCOUNTER — Other Ambulatory Visit: Payer: Self-pay | Admitting: Family Medicine

## 2021-08-27 ENCOUNTER — Other Ambulatory Visit: Payer: Self-pay

## 2021-08-27 MED ORDER — OMEPRAZOLE 20 MG PO CPDR: 20.0000 mg | DELAYED_RELEASE_CAPSULE | Freq: Every day | ORAL | 0 refills | Status: DC

## 2021-09-07 ENCOUNTER — Telehealth: Payer: Medicare HMO

## 2021-10-02 ENCOUNTER — Ambulatory Visit (INDEPENDENT_AMBULATORY_CARE_PROVIDER_SITE_OTHER): Payer: Medicare HMO

## 2021-10-02 DIAGNOSIS — I10 Essential (primary) hypertension: Secondary | ICD-10-CM | POA: Diagnosis not present

## 2021-10-02 DIAGNOSIS — E785 Hyperlipidemia, unspecified: Secondary | ICD-10-CM

## 2021-10-02 NOTE — Chronic Care Management (AMB) (Signed)
Chronic Care Management   CCM RN Visit Note  10/02/2021 Name: Jeremy Andrews MRN: [id number] DOB: 1955-08-06  Subjective: Jeremy Andrews is a 66 y.o. year old male who is a primary care patient of Einar Pheasant, Jobe Marker, MD. The care management team was consulted for assistance with disease management and care coordination needs.    Engaged with patient by telephone for follow up visit in response to provider referral for case management and/or care coordination services.   Consent to Services:  The patient was given information about Chronic Care Management services, agreed to services, and gave verbal consent prior to initiation of services.  Please see initial visit note for detailed documentation.   Patient agreed to services and verbal consent obtained.   Assessment: Review of patient past medical history, allergies, medications, health status, including review of consultants reports, laboratory and other test data, was performed as part of comprehensive evaluation and provision of chronic care management services.   SDOH (Social Determinants of Health) assessments and interventions performed:    CCM Care Plan  Allergies  Allergen Reactions   Lisinopril Swelling   Statins     Severe myopathy, in wheel chair Severe myopathy, in wheel chair    Outpatient Encounter Medications as of 10/02/2021  Medication Sig Note   allopurinol (ZYLOPRIM) 100 MG tablet Take 1 tablet (100 mg total) by mouth daily.    amLODipine (NORVASC) 10 MG tablet TAKE 1 TABLET EVERY DAY    celecoxib (CELEBREX) 200 MG capsule Take 1 capsule (200 mg total) by mouth daily. 02/16/2021: Patient states he takes as needed.    cetirizine (ZYRTEC) 10 MG tablet Take 1 tablet (10 mg total) by mouth daily.    labetalol (NORMODYNE) 100 MG tablet TAKE 1/2 TABLET TWICE DAILY    metFORMIN (GLUCOPHAGE) 500 MG tablet Take 1 tablet (500 mg total) by mouth 2 (two) times daily with a meal.    omeprazole (PRILOSEC) 20 MG capsule Take 1  capsule (20 mg total) by mouth daily.    allopurinol (ZYLOPRIM) 100 MG tablet Take 1 tablet by mouth daily.    Ferrous Sulfate (IRON) 325 (65 Fe) MG TABS Take 1 tablet (325 mg total) by mouth daily. (Patient not taking: Reported on 10/02/2021)    omeprazole (PRILOSEC) 20 MG capsule Take 1 capsule by mouth daily.    No facility-administered encounter medications on file as of 10/02/2021.    Patient Active Problem List   Diagnosis Date Noted   Myalgia due to statin 05/03/2020   Microcytic anemia 11/01/2019   Dyspnea on exertion 08/31/2019   Pain due to onychomycosis of toenails of both feet 02/18/2019   Essential hypertension 02/09/2019   Prediabetes 02/09/2019   Hyperlipidemia 02/09/2019   GERD (gastroesophageal reflux disease) 02/09/2019   Gout 02/09/2019   Environmental allergies 02/09/2019    Conditions to be addressed/monitored:HTN and HLD   Care Plan : [facility] plan of care  Updates made by Dannielle Karvonen, RN since 10/02/2021 12:00 AM   Problem: Chronic disease management education and care coordination needs.   Priority: High   Long-Range Goal: Development of plan of care to address chronic disease mangement and care coordination needs.   Start Date: 10/02/2021  Expected End Date: 01/02/2022  Priority: High  Current Barriers:  Knowledge Deficits related to plan of care for management of HTN and HLD   RNCM Clinical Goal(s):  Patient will verbalize understanding of plan for management of HTN and HLD as evidenced by patient self report and/ or notation  in chart.  take all medications exactly as prescribed and will call provider for medication related questions as evidenced by patient self report and/ or notation in chart    attend all scheduled medical appointments:   as evidenced by patient self report and/ or notation in chart.         continue to work with Consulting civil engineer and/or Social Worker to address care management and care coordination needs related to HTN and HLD as  evidenced by adherence to CM Team Scheduled appointments     through collaboration with Consulting civil engineer, provider, and care team.   Interventions: 1:1 collaboration with primary care provider regarding development and update of comprehensive plan of care as evidenced by provider attestation and co-signature Inter-disciplinary care team collaboration (see longitudinal plan of care) Evaluation of current treatment plan related to  self management and patient's adherence to plan as established by provider   Hyperlipidemia Interventions:  (Status:  Goal on track:  Yes.) Long Term Goal Reviewed importance of limiting foods high in cholesterol Advised to eat more fruits, vegetables, lean meat and increase fiber intake. Evaluation of current treatment plan related to hypertension self management and patient's adherence to plan as established by provider Reviewed medications with patient and discussed importance of compliance Discussed plans with patient for ongoing care management follow up and provided patient with direct contact information for care management team  Hypertension Interventions:  (Status:  Goal on track:  Yes.) Long Term Goal Last practice recorded BP readings:  BP Readings from Last 3 Encounters:  05/03/20 110/76  11/01/19 140/82  08/31/19 140/86  Most recent eGFR/CrCl: No results found for: EGFR  No components found for: CRCL  Evaluation of current treatment plan related to hypertension self management and patient's adherence to plan as established by provider Reviewed medications with patient and discussed importance of compliance Discussed plans with patient for ongoing care management follow up and provided patient with direct contact information for care management team Advised patient, providing education and rationale, to monitor blood pressure daily and record, calling PCP for findings outside established parameters Reviewed scheduled/upcoming provider  appointments Advised to continue to follow a low salt, heart healthy diet.   Patient Goals/Self-Care Activities: Take medications as prescribed   Attend all scheduled provider appointments Call pharmacy for medication refills 3-7 days in advance of running out of medications Call provider office for new concerns or questions, or blood pressure outside of discussed parameters  - continue to monitor blood pressure at least 1 -2 times per week and record - Follow up with your doctor as recommended. - If tolerated, try to be active everyday - continue to follow  a low salt diet.  - Ways to lower your cholesterol ( follow a heart healthy diet, get regular exercise, reach and maintain a healthy weight).         Plan:The patient has been provided with contact information for the care management team and has been advised to call with any health related questions or concerns.  The care management team will reach out to the patient again over the next 2-3 months. Quinn Plowman RN,BSN,CCM RN Case Manager [place]  [phone]

## 2021-10-02 NOTE — Patient Instructions (Signed)
Visit Information  Thank you for taking time to visit with me today. Please don't hesitate to contact me if I can be of assistance to you before our next scheduled telephone appointment.  Following are the goals we discussed today:  Take medications as prescribed   Attend all scheduled provider appointments Call pharmacy for medication refills 3-7 days in advance of running out of medications Call provider office for new concerns or questions, or blood pressure outside of discussed parameters  - continue to monitor blood pressure at least 1 -2 times per week and record - Follow up with your doctor as recommended. - If tolerated, try to be active everyday - continue to follow  a low salt diet.  - Ways to lower your cholesterol ( follow a heart healthy diet, get regular exercise, reach and maintain a healthy weight).    Our next appointment is by telephone on Dec 04, 2021 at 9:30 am  Please call the care guide team at [phone] if you need to cancel or reschedule your appointment.   If you are experiencing a Mental Health or [place] or need someone to talk to, please call the Suicide and Crisis Lifeline: 988 call 1-800-273-TALK (toll free, 24 hour hotline)   The patient verbalized understanding of instructions, educational materials, and care plan provided today and agreed to receive a mailed copy of patient instructions, educational materials, and care plan.   Quinn Plowman RN,BSN,CCM RN Case Manager [place]  [phone]

## 2021-10-15 ENCOUNTER — Ambulatory Visit: Payer: Medicare HMO | Admitting: Podiatry

## 2021-10-15 ENCOUNTER — Other Ambulatory Visit: Payer: Self-pay

## 2021-10-15 ENCOUNTER — Encounter: Payer: Self-pay | Admitting: Podiatry

## 2021-10-15 DIAGNOSIS — M79674 Pain in right toe(s): Secondary | ICD-10-CM

## 2021-10-15 DIAGNOSIS — M79675 Pain in left toe(s): Secondary | ICD-10-CM

## 2021-10-15 DIAGNOSIS — E1169 Type 2 diabetes mellitus with other specified complication: Secondary | ICD-10-CM | POA: Insufficient documentation

## 2021-10-15 DIAGNOSIS — B351 Tinea unguium: Secondary | ICD-10-CM

## 2021-10-15 DIAGNOSIS — E119 Type 2 diabetes mellitus without complications: Secondary | ICD-10-CM | POA: Diagnosis not present

## 2021-10-15 NOTE — Progress Notes (Signed)
This patient returns to my office for at risk foot care.  This patient requires this care by a professional since this patient will be at risk due to having diabetes according to patient.  This patient is unable to cut nails himself since the patient cannot reach his nails.These nails are painful walking and wearing shoes.  This patient presents for at risk foot care today.  General Appearance  Alert, conversant and in no acute stress.  Vascular  Dorsalis pedis and posterior tibial  pulses are weakly  palpable  bilaterally.  Capillary return is within normal limits  bilaterally. Temperature is within normal limits  bilaterally.  Neurologic  Senn-Weinstein monofilament wire test within normal limits  bilaterally. Muscle power within normal limits bilaterally.  Nails Thick disfigured discolored nails with subungual debris  from hallux to fifth toes bilaterally. No evidence of bacterial infection or drainage bilaterally.  Orthopedic  No limitations of motion  feet .  No crepitus or effusions noted.  No bony pathology or digital deformities noted.  Skin  normotropic skin with no porokeratosis noted bilaterally.  No signs of infections or ulcers noted.     Onychomycosis  Pain in right toes  Pain in left toes  Consent was obtained for treatment procedures.   Mechanical debridement of nails 1-5  bilaterally performed with a nail nipper.  Filed with dremel without incident.    Return office visit    3 months                  Told patient to return for periodic foot care and evaluation due to potential at risk complications.   Maeola Mchaney DPM  

## 2021-11-01 ENCOUNTER — Ambulatory Visit (INDEPENDENT_AMBULATORY_CARE_PROVIDER_SITE_OTHER): Payer: Medicare HMO | Admitting: Family Medicine

## 2021-11-01 DIAGNOSIS — Z0000 Encounter for general adult medical examination without abnormal findings: Secondary | ICD-10-CM

## 2021-11-01 NOTE — Progress Notes (Addendum)
?I connected with Gaynelle Arabian today by telephone and verified that I am speaking with the correct person using two identifiers. ?Location patient: home ?Location provider: work ?Persons participating in the virtual visit: patient, provider. ?  ?I discussed the limitations, risks, security and privacy concerns of performing an evaluation and management service by telephone and the availability of in person appointments. I also discussed with the patient that there may be a patient responsible charge related to this service. The patient expressed understanding and verbally consented to this telephonic visit.  ?  ?Interactive audio and video telecommunications were attempted between this provider and patient, however failed, due to patient having technical difficulties OR patient did not have access to video capability.  We continued and completed visit with audio only. ? ?Some vital signs may be absent or patient reported.  ? ?Time Spent with patient on telephone encounter: 30 minutes ? ?Subjective:  ? Parker Wherley is a 66 y.o. male who presents for Medicare Annual/Subsequent preventive examination. ? ?Review of Systems    ? ?Cardiac Risk Factors include: advanced age (>31mn, >>71women);diabetes mellitus;dyslipidemia;family history of premature cardiovascular disease;hypertension;male gender ? ?   ?Objective:  ?  ?There were no vitals filed for this visit. ?There is no height or weight on file to calculate BMI. ? ? ?  11/01/2021  ? 10:24 AM 02/16/2021  ?  9:13 AM 10/31/2020  ? 10:30 AM  ?Advanced Directives  ?Does Patient Have a Medical Advance Directive? No No No  ?Would patient like information on creating a medical advance directive? No - Patient declined Yes (MAU/Ambulatory/Procedural Areas - Information given) No - Patient declined  ? ? ?Current Medications (verified) ?Outpatient Encounter Medications as of 11/01/2021  ?Medication Sig  ? allopurinol (ZYLOPRIM) 100 MG tablet Take 1 tablet (100 mg total) by mouth  daily.  ? amLODipine (NORVASC) 10 MG tablet TAKE 1 TABLET EVERY DAY  ? celecoxib (CELEBREX) 200 MG capsule Take 1 capsule (200 mg total) by mouth daily.  ? cetirizine (ZYRTEC) 10 MG tablet Take 1 tablet (10 mg total) by mouth daily.  ? labetalol (NORMODYNE) 100 MG tablet TAKE 1/2 TABLET TWICE DAILY  ? metFORMIN (GLUCOPHAGE) 500 MG tablet Take 1 tablet (500 mg total) by mouth 2 (two) times daily with a meal.  ? omeprazole (PRILOSEC) 20 MG capsule Take 1 capsule by mouth daily.  ? omeprazole (PRILOSEC) 20 MG capsule Take 1 capsule (20 mg total) by mouth daily.  ? allopurinol (ZYLOPRIM) 100 MG tablet Take 1 tablet by mouth daily.  ? Ferrous Sulfate (IRON) 325 (65 Fe) MG TABS Take 1 tablet (325 mg total) by mouth daily. (Patient not taking: Reported on 10/02/2021)  ? ?No facility-administered encounter medications on file as of 11/01/2021.  ? ? ?Allergies (verified) ?Lisinopril and Statins  ? ?History: ?Past Medical History:  ?Diagnosis Date  ? Allergy   ? Colon polyps   ? Diabetes mellitus without complication ([place]   ? Gout   ? Hyperlipidemia   ? Hypertension   ? ?Past Surgical History:  ?Procedure Laterality Date  ? COLONOSCOPY W/ POLYPECTOMY N/A   ? NO PAST SURGERIES    ? ?Family History  ?Problem Relation Age of Onset  ? Hypertension Mother   ? Gout Mother   ? Other Mother   ?     meningioma  ? Hypertension Father   ? Kidney disease Sister   ? Hypertension Sister   ? Kidney failure Sister   ? Other Sister   ?  legally blind  ? Diabetes Sister   ? Arthritis Brother   ? Gout Brother   ? Breast cancer Sister   ? Breast cancer Maternal Grandmother 60  ? ?Social History  ? ?Socioeconomic History  ? Marital status: Legally Separated  ?  Spouse name: Not on file  ? Number of children: 1  ? Years of education: 2 years of college  ? Highest education level: Not on file  ?Occupational History  ? Not on file  ?Tobacco Use  ? Smoking status: Former  ?  Packs/day: 0.50  ?  Years: 25.00  ?  Pack years: 12.50  ?  Types:  Cigarettes  ?  Quit date: 21  ?  Years since quitting: 33.2  ? Smokeless tobacco: Never  ?Vaping Use  ? Vaping Use: Never used  ?Substance and Sexual Activity  ? Alcohol use: Yes  ?  Alcohol/week: 21.0 - 28.0 standard drinks  ?  Types: 21 - 28 Cans of beer per week  ?  Comment: 3-4 beers daily   ? Drug use: Not Currently  ?  Types: Marijuana  ?  Comment: last time around 2000 or even before then  ? Sexual activity: Yes  ?  Birth control/protection: None, Post-menopausal  ?Other Topics Concern  ? Not on file  ?Social History Narrative  ? 02/09/19  ? From: the area  ? Living: alone  ? 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   ? Family: one daughter - with CP in a group home, one son who has passed, and 3 step sons, separated from wife but good relationship, father also supportive  ?   ? Enjoys: not much currently, restoring cars, tablet games  ?   ? Exercise: not really, mowing the yard  ? Diet: does not follow a diabetic diet  ?   ? Safety  ? Seat belts: Yes   ? Guns: No  ? Safe in relationships: Yes   ? ?Social Determinants of Health  ? ?Financial Resource Strain: Low Risk   ? Difficulty of Paying Living Expenses: Not hard at all  ?Food Insecurity: No Food Insecurity  ? Worried About Charity fundraiser in the Last Year: Never true  ? Ran Out of Food in the Last Year: Never true  ?Transportation Needs: No Transportation Needs  ? Lack of Transportation (Medical): No  ? Lack of Transportation (Non-Medical): No  ?Physical Activity: Sufficiently Active  ? Days of Exercise per Week: 5 days  ? Minutes of Exercise per Session: 30 min  ?Stress: Not on file  ?Social Connections: Moderately Integrated  ? Frequency of Communication with Friends and Family: More than three times a week  ? Frequency of Social Gatherings with Friends and Family: Once a week  ? Attends Religious Services: 1 to 4 times per year  ? Active Member of Clubs or Organizations: No  ? Attends Archivist Meetings: 1 to  4 times per year  ? Marital Status: Separated  ? ? ?Tobacco Counseling ?Counseling given: Not Answered ? ? ?Clinical Intake: ? ?Pre-visit preparation completed: Yes ? ?Pain : No/denies pain ? ?  ? ?Nutritional Risks: None ?Diabetes: Yes ?CBG done?: No ?Did pt. bring in CBG monitor from home?: No ? ?How often do you need to have someone help you when you read instructions, pamphlets, or other written materials from your doctor or pharmacy?: 1 - Never ?What is the last grade level you completed in school?:  2 years of college ? ?Diabetic? yes ? ?Interpreter Needed?: No ? ?Comments: 2 years of college ?Information entered by :: Lisette Abu, LPN ? ? ?Activities of Daily Living ? ?  11/01/2021  ? 10:36 AM  ?In your present state of health, do you have any difficulty performing the following activities:  ?Hearing? 0  ?Vision? 0  ?Difficulty concentrating or making decisions? 0  ?Walking or climbing stairs? 0  ?Dressing or bathing? 0  ?Doing errands, shopping? 0  ?Preparing Food and eating ? N  ?Using the Toilet? N  ?In the past six months, have you accidently leaked urine? N  ?Do you have problems with loss of bowel control? N  ?Managing your Medications? N  ?Managing your Finances? N  ?Housekeeping or managing your Housekeeping? N  ? ? ?Patient Care Team: ?Lesleigh Noe, MD as PCP - General (Family Medicine) ?Michiel Cowboy, MD as Referring Physician (Gastroenterology) ?Dannielle Karvonen, RN as Case Manager ? ?Indicate any recent Medical Services you may have received from other than [facility] providers in the past year (date may be approximate). ? ?   ?Assessment:  ? This is a routine wellness examination for Barth. ? ?Hearing/Vision screen ?Hearing Screening - Comments:: Patient denied any hearing difficulty.   ?No hearing aids. ? ?Vision Screening - Comments:: Patient does not wear any corrective lenses/contacts.   ?Eye exam done by: [facility] ? ? ?Dietary issues and exercise activities discussed: ?Current  Exercise Habits: Home exercise routine, Type of exercise: walking, Time (Minutes): 30, Frequency (Times/Week): 5, Weekly Exercise (Minutes/Week): 150, Intensity: Moderate, Exercise limited by: None identified ? ? [place]

## 2021-11-01 NOTE — Patient Instructions (Signed)
Mr. Birdwell , ?Thank you for taking time to come for your Medicare Wellness Visit. I appreciate your ongoing commitment to your health goals. Please review the following plan we discussed and let me know if I can assist you in the future.  ? ?Screening recommendations/referrals: ?Colonoscopy: 02/03/2018; due 02/03/2022 ?Recommended yearly ophthalmology/optometry visit for glaucoma screening and checkup ?Recommended yearly dental visit for hygiene and checkup ? ?Vaccinations: ?Influenza vaccine: declined ?Pneumococcal vaccine: 03/07/2017, 08/31/2019 ?Tdap vaccine: 03/07/2017; due every 10 years ?Shingles vaccine: never done   ?Covid-19: 09/28/2019, 10/19/2019, 09/26/2020 ? ?Advanced directives: Advance directive discussed with you today. Even though you declined this today please call our office should you change your mind and we can give you the proper paperwork for you to fill out. ? ?Conditions/risks identified: Yes ? ?Next appointment: Please schedule your next Medicare Wellness Visit with your Nurse Health Advisor in 1 year. ? ?Preventive Care 11 Years and Older, Male ?Preventive care refers to lifestyle choices and visits with your health care provider that can promote health and wellness. ?What does preventive care include? ?A yearly physical exam. This is also called an annual well check. ?Dental exams once or twice a year. ?Routine eye exams. Ask your health care provider how often you should have your eyes checked. ?Personal lifestyle choices, including: ?Daily care of your teeth and gums. ?Regular physical activity. ?Eating a healthy diet. ?Avoiding tobacco and drug use. ?Limiting alcohol use. ?Practicing safe sex. ?Taking low doses of aspirin every day. ?Taking vitamin and mineral supplements as recommended by your health care provider. ?What happens during an annual well check? ?The services and screenings done by your health care provider during your annual well check will depend on your age, overall health,  lifestyle risk factors, and family history of disease. ?Counseling  ?Your health care provider may ask you questions about your: ?Alcohol use. ?Tobacco use. ?Drug use. ?Emotional well-being. ?Home and relationship well-being. ?Sexual activity. ?Eating habits. ?History of falls. ?Memory and ability to understand (cognition). ?Work and work Statistician. ?Screening  ?You may have the following tests or measurements: ?Height, weight, and BMI. ?Blood pressure. ?Lipid and cholesterol levels. These may be checked every 5 years, or more frequently if you are over 9 years old. ?Skin check. ?Lung cancer screening. You may have this screening every year starting at age 68 if you have a 30-pack-year history of smoking and currently smoke or have quit within the past 15 years. ?Fecal occult blood test (FOBT) of the stool. You may have this test every year starting at age 45. ?Flexible sigmoidoscopy or colonoscopy. You may have a sigmoidoscopy every 5 years or a colonoscopy every 10 years starting at age 38. ?Prostate cancer screening. Recommendations will vary depending on your family history and other risks. ?Hepatitis C blood test. ?Hepatitis B blood test. ?Sexually transmitted disease (STD) testing. ?Diabetes screening. This is done by checking your blood sugar (glucose) after you have not eaten for a while (fasting). You may have this done every 1-3 years. ?Abdominal aortic aneurysm (AAA) screening. You may need this if you are a current or former smoker. ?Osteoporosis. You may be screened starting at age 76 if you are at high risk. ?Talk with your health care provider about your test results, treatment options, and if necessary, the need for more tests. ?Vaccines  ?Your health care provider may recommend certain vaccines, such as: ?Influenza vaccine. This is recommended every year. ?Tetanus, diphtheria, and acellular pertussis (Tdap, Td) vaccine. You may need a Td booster  every 10 years. ?Zoster vaccine. You may need this  after age 36. ?Pneumococcal 13-valent conjugate (PCV13) vaccine. One dose is recommended after age 19. ?Pneumococcal polysaccharide (PPSV23) vaccine. One dose is recommended after age 57. ?Talk to your health care provider about which screenings and vaccines you need and how often you need them. ?This information is not intended to replace advice given to you by your health care provider. Make sure you discuss any questions you have with your health care provider. ?Document Released: 08/18/2015 Document Revised: 04/10/2016 Document Reviewed: 05/23/2015 ?Elsevier Interactive Patient Education ? 2017 [place]. ? ?Fall Prevention in the Home ?Falls can cause injuries. They can happen to people of all ages. There are many things you can do to make your home safe and to help prevent falls. ?What can I do on the outside of my home? ?Regularly fix the edges of walkways and driveways and fix any cracks. ?Remove anything that might make you trip as you walk through a door, such as a raised step or threshold. ?Trim any bushes or trees on the path to your home. ?Use bright outdoor lighting. ?Clear any walking paths of anything that might make someone trip, such as rocks or tools. ?Regularly check to see if handrails are loose or broken. Make sure that both sides of any steps have handrails. ?Any raised decks and porches should have guardrails on the edges. ?Have any leaves, snow, or ice cleared regularly. ?Use sand or salt on walking paths during winter. ?Clean up any spills in your garage right away. This includes oil or grease spills. ?What can I do in the bathroom? ?Use night lights. ?Install grab bars by the toilet and in the tub and shower. Do not use towel bars as grab bars. ?Use non-skid mats or decals in the tub or shower. ?If you need to sit down in the shower, use a plastic, non-slip stool. ?Keep the floor dry. Clean up any water that spills on the floor as soon as it happens. ?Remove soap buildup in the tub or  shower regularly. ?Attach bath mats securely with double-sided non-slip rug tape. ?Do not have throw rugs and other things on the floor that can make you trip. ?What can I do in the bedroom? ?Use night lights. ?Make sure that you have a light by your bed that is easy to reach. ?Do not use any sheets or blankets that are too big for your bed. They should not hang down onto the floor. ?Have a firm chair that has side arms. You can use this for support while you get dressed. ?Do not have throw rugs and other things on the floor that can make you trip. ?What can I do in the kitchen? ?Clean up any spills right away. ?Avoid walking on wet floors. ?Keep items that you use a lot in easy-to-reach places. ?If you need to reach something above you, use a strong step stool that has a grab bar. ?Keep electrical cords out of the way. ?Do not use floor polish or wax that makes floors slippery. If you must use wax, use non-skid floor wax. ?Do not have throw rugs and other things on the floor that can make you trip. ?What can I do with my stairs? ?Do not leave any items on the stairs. ?Make sure that there are handrails on both sides of the stairs and use them. Fix handrails that are broken or loose. Make sure that handrails are as long as the stairways. ?Check any carpeting to  make sure that it is firmly attached to the stairs. Fix any carpet that is loose or worn. ?Avoid having throw rugs at the top or bottom of the stairs. If you do have throw rugs, attach them to the floor with carpet tape. ?Make sure that you have a light switch at the top of the stairs and the bottom of the stairs. If you do not have them, ask someone to add them for you. ?What else can I do to help prevent falls? ?Wear shoes that: ?Do not have high heels. ?Have rubber bottoms. ?Are comfortable and fit you well. ?Are closed at the toe. Do not wear sandals. ?If you use a stepladder: ?Make sure that it is fully opened. Do not climb a closed stepladder. ?Make  sure that both sides of the stepladder are locked into place. ?Ask someone to hold it for you, if possible. ?Clearly mark and make sure that you can see: ?Any grab bars or handrails. ?First and last steps. ?[place]

## 2021-11-12 ENCOUNTER — Other Ambulatory Visit: Payer: Self-pay | Admitting: Family Medicine

## 2021-11-16 ENCOUNTER — Encounter: Payer: Self-pay | Admitting: Family Medicine

## 2021-12-03 ENCOUNTER — Encounter: Payer: Medicare HMO | Admitting: Family Medicine

## 2021-12-04 ENCOUNTER — Ambulatory Visit (INDEPENDENT_AMBULATORY_CARE_PROVIDER_SITE_OTHER): Payer: Medicare HMO

## 2021-12-04 DIAGNOSIS — I10 Essential (primary) hypertension: Secondary | ICD-10-CM

## 2021-12-04 DIAGNOSIS — E785 Hyperlipidemia, unspecified: Secondary | ICD-10-CM

## 2021-12-04 NOTE — Patient Instructions (Signed)
Visit Information ? ?Thank you for taking time to visit with me today. Please don't hesitate to contact me if I can be of assistance to you before our next scheduled telephone appointment. ? ?Following are the goals we discussed today:  ?Continue to take your medications as prescribed   ?Attend all scheduled provider appointments ?Call pharmacy for medication refills 3-7 days in advance of running out of medications ?Call provider office for new concerns or questions, or blood pressure outside of discussed parameters  ?- continue to monitor blood pressure at least 1 -2 times per week and record ?- Follow up with your doctor as recommended. ?- If tolerated, try to be active everyday ?- continue to follow  a low salt diet.  ?- Ways to lower your cholesterol ( follow a heart healthy diet, get regular exercise, reach and maintain a healthy weight).   ? ?Our next appointment is by telephone on 02/11/22 at 10:00 am ? ?Please call the care guide team at [phone] if you need to cancel or reschedule your appointment.  ? ?If you are experiencing a Mental Health or [place] or need someone to talk to, please call the Suicide and Crisis Lifeline: 988 ?call 1-800-273-TALK (toll free, 24 hour hotline)  ? ?The patient verbalized understanding of instructions, educational materials, and care plan provided today and agreed to receive a mailed copy of patient instructions, educational materials, and care plan.  ? ?Quinn Plowman RN,BSN,CCM ?RN Case Manager ?[place]  ?[phone] ? ?

## 2021-12-04 NOTE — Chronic Care Management (AMB) (Signed)
?Chronic Care Management  ? ?CCM RN Visit Note ? ?12/04/2021 ?Name: Jeremy Andrews MRN: [id number] DOB: 1955/12/17 ? ?Subjective: ?Jeremy Andrews is a 66 y.o. year old male who is a primary care patient of Einar Pheasant, Jobe Marker, MD. The care management team was consulted for assistance with disease management and care coordination needs.   ? ?Engaged with patient by telephone for follow up visit in response to provider referral for case management and/or care coordination services.  ? ?Consent to Services:  ?The patient was given information about Chronic Care Management services, agreed to services, and gave verbal consent prior to initiation of services.  Please see initial visit note for detailed documentation.  ? ?Patient agreed to services and verbal consent obtained.  ? ?Assessment: Review of patient past medical history, allergies, medications, health status, including review of consultants reports, laboratory and other test data, was performed as part of comprehensive evaluation and provision of chronic care management services.  ? ?SDOH (Social Determinants of Health) assessments and interventions performed:   ? ?CCM Care Plan ? ?Allergies  ?Allergen Reactions  ? Lisinopril Swelling  ? Statins   ?  Severe myopathy, in wheel chair ?Severe myopathy, in wheel chair  ? ? ?Outpatient Encounter Medications as of 12/04/2021  ?Medication Sig Note  ? allopurinol (ZYLOPRIM) 100 MG tablet Take 1 tablet (100 mg total) by mouth daily.   ? amLODipine (NORVASC) 10 MG tablet TAKE 1 TABLET EVERY DAY   ? celecoxib (CELEBREX) 200 MG capsule Take 1 capsule (200 mg total) by mouth daily. 02/16/2021: Patient states he takes as needed.   ? cetirizine (ZYRTEC) 10 MG tablet Take 1 tablet (10 mg total) by mouth daily.   ? labetalol (NORMODYNE) 100 MG tablet TAKE 1/2 TABLET TWICE DAILY   ? metFORMIN (GLUCOPHAGE) 500 MG tablet Take 1 tablet (500 mg total) by mouth 2 (two) times daily with a meal.   ? omeprazole (PRILOSEC) 20 MG capsule Take 1 capsule  by mouth daily.   ? allopurinol (ZYLOPRIM) 100 MG tablet Take 1 tablet by mouth daily.   ? Ferrous Sulfate (IRON) 325 (65 Fe) MG TABS Take 1 tablet (325 mg total) by mouth daily. (Patient not taking: Reported on 10/02/2021)   ? omeprazole (PRILOSEC) 20 MG capsule TAKE 1 CAPSULE EVERY DAY   ? ?No facility-administered encounter medications on file as of 12/04/2021.  ? ? ?Patient Active Problem List  ? Diagnosis Date Noted  ? Diabetes mellitus without complication ([place]) 25/95/6387  ? Myalgia due to statin 05/03/2020  ? Microcytic anemia 11/01/2019  ? Dyspnea on exertion 08/31/2019  ? Pain due to onychomycosis of toenails of both feet 02/18/2019  ? Essential hypertension 02/09/2019  ? Prediabetes 02/09/2019  ? Hyperlipidemia 02/09/2019  ? GERD (gastroesophageal reflux disease) 02/09/2019  ? Gout 02/09/2019  ? Environmental allergies 02/09/2019  ? ? ?Conditions to be addressed/monitored:HTN and HLD ? ?Care Plan : RNCM plan of care  ?Updates made by Dannielle Karvonen, RN since 12/04/2021 12:00 AM  ?  ? ?Problem: Chronic disease management education and care coordination needs.   ?Priority: High  ?  ? ?Long-Range Goal: Development of plan of care to address chronic disease mangement and care coordination needs.   ?Start Date: 10/02/2021  ?Expected End Date: 03/04/2022  ?Priority: High  ?Note:   ?Current Barriers:  ?Knowledge Deficits related to plan of care for management of HTN and HLD  ?Patient states he is doing well.  Reports having a virtual visit with his primary care  provider on 10/15/21.  Patient states he continues to monitor his blood pressure at home 1-2 times per week. He reports his blood pressure ranges from 117/80 - 130/80. ?RNCM Clinical Goal(s):  ?Patient will verbalize understanding of plan for management of HTN and HLD as evidenced by patient self report and/ or notation in chart.  ?take all medications exactly as prescribed and will call provider for medication related questions as evidenced by patient self  report and/ or notation in chart    ?attend all scheduled medical appointments:   as evidenced by patient self report and/ or notation in chart.         ?continue to work with Consulting civil engineer and/or Social Worker to address care management and care coordination needs related to HTN and HLD as evidenced by adherence to CM Team Scheduled appointments     through collaboration with Consulting civil engineer, provider, and care team.  ? ?Interventions: ?1:1 collaboration with primary care provider regarding development and update of comprehensive plan of care as evidenced by provider attestation and co-signature ?Inter-disciplinary care team collaboration (see longitudinal plan of care) ?Evaluation of current treatment plan related to  self management and patient's adherence to plan as established by provider ? ? ?Hyperlipidemia Interventions:  (Status:  Goal on track:  Yes.) Long Term Goal ?Reviewed importance of limiting foods high in cholesterol ?Discussed eating more fruits, vegetables, lean meat and increase fiber intake. ?Evaluation of current treatment plan related to hypertension self management and patient's adherence to plan as established by provider ?Reviewed medications with patient and discussed importance of compliance ?Discussed plans with patient for ongoing care management follow up and provided patient with direct contact information for care management team ?Reviewed scheduled/ upcoming provider visit ? ?Hypertension Interventions:  (Status:  Goal on track:  Yes.) Long Term Goal ?Last practice recorded BP readings:  ?BP Readings from Last 3 Encounters:  ?05/03/20 110/76  ?11/01/19 140/82  ?08/31/19 140/86  ?Most recent eGFR/CrCl: No results found for: EGFR  No components found for: CRCL ? ?Evaluation of current treatment plan related to hypertension self management and patient's adherence to plan as established by provider ?Reviewed medications with patient and discussed importance of compliance ?Discussed  plans with patient for ongoing care management follow up and provided patient with direct contact information for care management team ?Advised patient, providing education and rationale, to monitor blood pressure daily and record, calling PCP for findings outside established parameters ?Reviewed scheduled/upcoming provider appointments ?Advised to continue to follow a low salt, heart healthy diet.  ? ?Patient Goals/Self-Care Activities: ?Continue to take your medications as prescribed   ?Attend all scheduled provider appointments ?Call pharmacy for medication refills 3-7 days in advance of running out of medications ?Call provider office for new concerns or questions, or blood pressure outside of discussed parameters  ?- continue to monitor blood pressure at least 1 -2 times per week and record ?- Follow up with your doctor as recommended. ?- If tolerated, try to be active everyday ?- continue to follow  a low salt diet.  ?- Ways to lower your cholesterol ( follow a heart healthy diet, get regular exercise, reach and maintain a healthy weight).   ?  ?  ? ? ?Plan:The patient has been provided with contact information for the care management team and has been advised to call with any health related questions or concerns.  ?The care management team will reach out to the patient again over the next 2-3 months. ?Quinn Plowman RN,BSN,CCM ?  RN Case Manager ?[place]  ?[phone] ? ? ? ? ? ? ? ? ? ?

## 2021-12-17 ENCOUNTER — Other Ambulatory Visit: Payer: Self-pay | Admitting: Family Medicine

## 2022-01-01 ENCOUNTER — Other Ambulatory Visit: Payer: Self-pay | Admitting: Family Medicine

## 2022-01-02 DIAGNOSIS — E785 Hyperlipidemia, unspecified: Secondary | ICD-10-CM | POA: Diagnosis not present

## 2022-01-02 DIAGNOSIS — Z87891 Personal history of nicotine dependence: Secondary | ICD-10-CM | POA: Diagnosis not present

## 2022-01-02 DIAGNOSIS — I10 Essential (primary) hypertension: Secondary | ICD-10-CM | POA: Diagnosis not present

## 2022-01-08 ENCOUNTER — Ambulatory Visit (INDEPENDENT_AMBULATORY_CARE_PROVIDER_SITE_OTHER): Payer: Medicare HMO | Admitting: Family Medicine

## 2022-01-08 VITALS — BP 120/80 | HR 106 | Temp 97.6°F | Ht 70.75 in | Wt 246.2 lb

## 2022-01-08 DIAGNOSIS — M1A0790 Idiopathic chronic gout, unspecified ankle and foot, without tophus (tophi): Secondary | ICD-10-CM

## 2022-01-08 DIAGNOSIS — D509 Iron deficiency anemia, unspecified: Secondary | ICD-10-CM | POA: Diagnosis not present

## 2022-01-08 DIAGNOSIS — Z125 Encounter for screening for malignant neoplasm of prostate: Secondary | ICD-10-CM

## 2022-01-08 DIAGNOSIS — I10 Essential (primary) hypertension: Secondary | ICD-10-CM

## 2022-01-08 DIAGNOSIS — M791 Myalgia, unspecified site: Secondary | ICD-10-CM

## 2022-01-08 DIAGNOSIS — Z789 Other specified health status: Secondary | ICD-10-CM

## 2022-01-08 DIAGNOSIS — Z0000 Encounter for general adult medical examination without abnormal findings: Secondary | ICD-10-CM

## 2022-01-08 DIAGNOSIS — T466X5A Adverse effect of antihyperlipidemic and antiarteriosclerotic drugs, initial encounter: Secondary | ICD-10-CM

## 2022-01-08 DIAGNOSIS — E1169 Type 2 diabetes mellitus with other specified complication: Secondary | ICD-10-CM | POA: Diagnosis not present

## 2022-01-08 DIAGNOSIS — E785 Hyperlipidemia, unspecified: Secondary | ICD-10-CM | POA: Diagnosis not present

## 2022-01-08 LAB — COMPREHENSIVE METABOLIC PANEL
ALT: 36 U/L (ref 0–53)
AST: 45 U/L — ABNORMAL HIGH (ref 0–37)
Albumin: 3.9 g/dL (ref 3.5–5.2)
Alkaline Phosphatase: 61 U/L (ref 39–117)
BUN: 8 mg/dL (ref 6–23)
CO2: 28 mEq/L (ref 19–32)
Calcium: 9.7 mg/dL (ref 8.4–10.5)
Chloride: 98 mEq/L (ref 96–112)
Creatinine, Ser: 0.88 mg/dL (ref 0.40–1.50)
GFR: 88.62 mL/min (ref >60.00)
Glucose, Bld: 226 mg/dL — ABNORMAL HIGH (ref 70–99)
Potassium: 4.5 mEq/L (ref 3.5–5.1)
Sodium: 136 mEq/L (ref 135–145)
Total Bilirubin: 0.6 mg/dL (ref 0.2–1.2)
Total Protein: 7.2 g/dL (ref 6.0–8.3)

## 2022-01-08 LAB — CBC
HCT: 39.8 % (ref 39.0–52.0)
Hemoglobin: 12.7 g/dL — ABNORMAL LOW (ref 13.0–17.0)
MCHC: 32 g/dL (ref 30.0–36.0)
MCV: 85.8 fl (ref 78.0–100.0)
Platelets: 168 10*3/uL (ref 150.0–400.0)
RBC: 4.64 Mil/uL (ref 4.22–5.81)
RDW: 25.3 % — ABNORMAL HIGH (ref 11.5–15.5)
WBC: 4.3 10*3/uL (ref 4.0–10.5)

## 2022-01-08 LAB — HEMOGLOBIN A1C: Hgb A1c MFr Bld: 10.2 % — ABNORMAL HIGH (ref 4.6–6.5)

## 2022-01-08 LAB — LIPID PANEL
Cholesterol: 184 mg/dL (ref 0–200)
HDL: 31.2 mg/dL — ABNORMAL LOW (ref >39.00)
LDL Cholesterol: 119 mg/dL — ABNORMAL HIGH (ref 0–99)
NonHDL: 152.98
Total CHOL/HDL Ratio: 6
Triglycerides: 168 mg/dL — ABNORMAL HIGH (ref 0.0–149.0)
VLDL: 33.6 mg/dL (ref 0.0–40.0)

## 2022-01-08 LAB — MICROALBUMIN / CREATININE URINE RATIO
Creatinine,U: 145.9 mg/dL
Microalb Creat Ratio: 114 mg/g — ABNORMAL HIGH (ref 0.0–30.0)
Microalb, Ur: 166.3 mg/dL — ABNORMAL HIGH (ref 0.0–1.9)

## 2022-01-08 LAB — PSA, MEDICARE: PSA: 9.23 ng/ml — ABNORMAL HIGH (ref 0.10–4.00)

## 2022-01-08 LAB — URIC ACID: Uric Acid, Serum: 5.9 mg/dL (ref 4.0–7.8)

## 2022-01-08 MED ORDER — ALLOPURINOL 100 MG PO TABS
100.0000 mg | ORAL_TABLET | Freq: Every day | ORAL | 3 refills | Status: DC
Start: 2022-01-08 — End: 2022-11-28

## 2022-01-08 MED ORDER — AMLODIPINE BESYLATE 10 MG PO TABS: 10.0000 mg | ORAL_TABLET | Freq: Every day | ORAL | 1 refills | Status: DC

## 2022-01-08 MED ORDER — OMEPRAZOLE 20 MG PO CPDR: 20.0000 mg | DELAYED_RELEASE_CAPSULE | Freq: Every day | ORAL | 1 refills | Status: DC

## 2022-01-08 NOTE — Patient Instructions (Addendum)
Daily recommendation for alcohol is 2 drinks for men  Encourage going to the dentist annually   If you change your mind about the abdominal ultrasound let me know and I can order it  Due for colon cancer screening - if you do not hear by August call our office or GI to schedule  Traci Sermon, MD   [facility]   [address] [place], [place] [address]   [phone]   [phone] (Fax)     [facility]   [address]   [place], [place] [address]   [phone]

## 2022-01-08 NOTE — Progress Notes (Signed)
Annual Exam   Chief Complaint:  Chief Complaint  Patient presents with   Medicare Wellness   Cough    Since last week after he mowed the grass    History of Present Illness:  Jeremy Andrews is a 66 y.o. presents today for annual examination.    #Cough - since mowing the grass - treatment: robutussin, cold medication - already taking zyrtec - improved now  Nutrition/Lifestyle Diet: about the same - eats what he wants, moderation Exercise: walking He is single partner, contraception - post menopausal status.  Any issues with getting or keeping erection? Yes  Social History   Tobacco Use  Smoking Status Former   Packs/day: 0.50   Years: 25.00   Pack years: 12.50   Types: Cigarettes   Quit date: 1990   Years since quitting: 33.4  Smokeless Tobacco Never   Social History   Substance and Sexual Activity  Alcohol Use Yes   Alcohol/week: 21.0 - 28.0 standard drinks   Types: 21 - 28 Cans of beer per week   Comment: 3-4 beers daily    Social History   Substance and Sexual Activity  Drug Use Not Currently   Types: Marijuana   Comment: last time around 2000 or even before then     Safety The patient wears seatbelts: yes.     The patient feels safe at home and in their relationships: yes.  General Health Dentist in the last year: No Eye doctor: yes  Weight Wt Readings from Last 3 Encounters:  01/08/22 246 lb 4 oz (111.7 kg)  05/03/20 242 lb (109.8 kg)  11/01/19 250 lb 8 oz (113.6 kg)   Patient has high BMI  BMI Readings from Last 1 Encounters:  01/08/22 34.59 kg/m     Chronic disease screening Blood pressure monitoring:  BP Readings from Last 3 Encounters:  01/08/22 120/80  05/03/20 110/76  11/01/19 140/82    Lipid Monitoring: Indication for screening: age >35, obesity, diabetes, family hx, CV risk factors.  Lipid screening: Yes  Lab Results  Component Value Date   CHOL 171 08/31/2019   HDL 41.20 08/31/2019   LDLCALC 113 (H) 08/31/2019    TRIG 85.0 08/31/2019   CHOLHDL 4 08/31/2019     Diabetes Screening: age >67, overweight, family hx, PCOS, hx of gestational diabetes, at risk ethnicity, elevated blood pressure >135/80.  Diabetes Screening screening: Yes  Lab Results  Component Value Date   HGBA1C 6.2 08/31/2019     Prostate Cancer Screening: Yes Age 53-69 yo Shared Decision Making Higher Risk: Older age, African American, Family Hx of Prostate Cancer - Yes Benefits: screening may prevent 1.3 deaths from prostate cancer over 13 years per 1000 men screened and prevent 3 metastatic cases per 1000 men screened. Not enough evidence to support more benefit for AA or [place] Harms: False Positive and psychological harms. 15% of me with false positive over a 2 to 4 year period > resulting in biopsy and complications such as pain, hematospermia, infections. Overdiagnosis - increases with age - found that 20-50% of prostate cancer through screening may have never caused any issues. Harms of treatment include - erectile dysfunction, urinary incontinence, and bothersome bowel symptoms.   After discussion he does want to get a PSA checked today.   Inadequate evidence for screening <55 No mortality benefit for screening >70   No results found for: PSA1, PSA     Colon Cancer Screening:  Age 15-75 yo - benefits outweigh the risk. Adults 52-85 yo  who have never been screened benefit.  Benefits: 134000 people in 2016 will be diagnosed and 49,000 will die - early detection helps Harms: Complications 2/2 to colonoscopy High Risk (Colonoscopy): genetic disorder (Lynch syndrome or familial adenomatous polyposis), personal hx of IBD, previous adenomatous polyp, or previous colorectal cancer, FamHx start 10 years before the age at diagnosis, increased in males and black race  Options:  FIT - looks for hemoglobin (blood in the stool) - specific and fairly sensitive - must be done annually Cologuard - looks for DNA and blood - more  sensitive - therefore can have more false positives, every 3 years Colonoscopy - every 10 years if normal - sedation, bowl prep, must have someone drive you  Shared decision making and the patient had decided to do colonoscopy due 03/2022.   Social History   Tobacco Use  Smoking Status Former   Packs/day: 0.50   Years: 25.00   Pack years: 12.50   Types: Cigarettes   Quit date: 1990   Years since quitting: 33.4  Smokeless Tobacco Never    Lung Cancer Screening (Ages 06-26): not applicable   Abdominal Aortic Aneurysm:  Age 72-75, 1 time screening, men who have ever smoked discussed    Past Medical History:  Diagnosis Date   Allergy    Colon polyps    Diabetes mellitus without complication ([place])    Gout    Hyperlipidemia    Hypertension     Past Surgical History:  Procedure Laterality Date   COLONOSCOPY W/ POLYPECTOMY N/A    NO PAST SURGERIES      Prior to Admission medications   Medication Sig Start Date End Date Taking? Authorizing Provider  allopurinol (ZYLOPRIM) 100 MG tablet Take 1 tablet (100 mg total) by mouth daily. 11/15/20  Yes Lesleigh Noe, MD  amLODipine (NORVASC) 10 MG tablet TAKE 1 TABLET EVERY DAY 08/15/21  Yes Lesleigh Noe, MD  celecoxib (CELEBREX) 200 MG capsule Take 1 capsule (200 mg total) by mouth daily. Patient taking differently: Take 200 mg by mouth as needed for mild pain or moderate pain. 11/15/20  Yes Lesleigh Noe, MD  cetirizine (ZYRTEC) 10 MG tablet Take 1 tablet (10 mg total) by mouth daily. 11/15/20  Yes Lesleigh Noe, MD  Ferrous Sulfate (IRON) 325 (65 Fe) MG TABS Take 1 tablet (325 mg total) by mouth daily. 11/15/20  Yes Lesleigh Noe, MD  labetalol (NORMODYNE) 100 MG tablet TAKE 1/2 TABLET TWICE DAILY 12/18/21  Yes Lesleigh Noe, MD  metFORMIN (GLUCOPHAGE) 500 MG tablet TAKE 1 TABLET TWICE DAILY WITH MEALS 01/02/22  Yes Lesleigh Noe, MD  omeprazole (PRILOSEC) 20 MG capsule TAKE 1 CAPSULE EVERY DAY 11/12/21  Yes Lesleigh Noe, MD    Allergies  Allergen Reactions   Lisinopril Swelling   Statins     Severe myopathy, in wheel chair Severe myopathy, in wheel chair     Social History   Socioeconomic History   Marital status: Legally Separated    Spouse name: Not on file   Number of children: 1   Years of education: 2 years of college   Highest education level: Not on file  Occupational History   Not on file  Tobacco Use   Smoking status: Former    Packs/day: 0.50    Years: 25.00    Pack years: 12.50    Types: Cigarettes    Quit date: 1990    Years since quitting: 33.4   Smokeless tobacco:  Never  Vaping Use   Vaping Use: Never used  Substance and Sexual Activity   Alcohol use: Yes    Alcohol/week: 21.0 - 28.0 standard drinks    Types: 21 - 28 Cans of beer per week    Comment: 3-4 beers daily    Drug use: Not Currently    Types: Marijuana    Comment: last time around 2000 or even before then   Sexual activity: Yes    Birth control/protection: None, Post-menopausal  Other Topics Concern   Not on file  Social History Narrative   02/09/19   From: the area   Living: alone   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      Family: one daughter - with CP in a group home, one son who has passed, and 3 step sons, separated from wife but good relationship, father also supportive      Enjoys: not much currently, restoring cars, tablet games      Exercise: not really, mowing the yard   Diet: does not follow a diabetic diet      Safety   Seat belts: Yes    Guns: No   Safe in relationships: Yes    Social Determinants of Radio broadcast assistant Strain: Low Risk    Difficulty of Paying Living Expenses: Not hard at all  Food Insecurity: No Food Insecurity   Worried About Charity fundraiser in the Last Year: Never true   [place] in the Last Year: Never true  Transportation Needs: No Transportation Needs   Lack of Transportation (Medical): No   Lack of  Transportation (Non-Medical): No  Physical Activity: Sufficiently Active   Days of Exercise per Week: 5 days   Minutes of Exercise per Session: 30 min  Stress: Not on file  Social Connections: Moderately Integrated   Frequency of Communication with Friends and Family: More than three times a week   Frequency of Social Gatherings with Friends and Family: Once a week   Attends Religious Services: 1 to 4 times per year   Active Member of [employer] or Organizations: No   Attends Music therapist: 1 to 4 times per year   Marital Status: Separated  Intimate Partner Violence: Not At Risk   Fear of Current or Ex-Partner: No   Emotionally Abused: No   Physically Abused: No   Sexually Abused: No    Family History  Problem Relation Age of Onset   Hypertension Mother    Gout Mother    Other Mother        meningioma   Hypertension Father    Kidney disease Sister    Hypertension Sister    Kidney failure Sister    Other Sister        legally blind   Diabetes Sister    Arthritis Brother    Gout Brother    Breast cancer Sister    Breast cancer Maternal Grandmother 60    Review of Systems  Constitutional:  Negative for chills and fever.  HENT:  Negative for congestion and sore throat.   Eyes:  Negative for blurred vision and double vision.  Respiratory:  Negative for shortness of breath.   Cardiovascular:  Negative for chest pain.  Gastrointestinal:  Negative for heartburn, nausea and vomiting.  Genitourinary: Negative.   Musculoskeletal: Negative.  Negative for myalgias.  Skin:  Negative for rash.  Neurological:  Negative for dizziness and headaches.  Endo/Heme/Allergies:  Does not bruise/bleed easily.  Psychiatric/Behavioral:  Negative for depression. The patient is not nervous/anxious.     Physical Exam BP 120/80   Pulse (!) 106   Temp 97.6 F (36.4 C) (Temporal)   Ht 5' 10.75" (1.797 m)   Wt 246 lb 4 oz (111.7 kg)   SpO2 100%   BMI 34.59 kg/m    BP Readings  from Last 3 Encounters:  01/08/22 120/80  05/03/20 110/76  11/01/19 140/82      Physical Exam Constitutional:      General: He is not in acute distress.    Appearance: He is well-developed. He is not diaphoretic.  HENT:     Head: Normocephalic and atraumatic.     Right Ear: Tympanic membrane and ear canal normal.     Left Ear: Tympanic membrane and ear canal normal.     Nose: Nose normal.     Mouth/Throat:     Pharynx: Uvula midline.  Eyes:     General: No scleral icterus.    Conjunctiva/sclera: Conjunctivae normal.     Pupils: Pupils are equal, round, and reactive to light.  Cardiovascular:     Rate and Rhythm: Normal rate and regular rhythm.     Heart sounds: Normal heart sounds. No murmur heard. Pulmonary:     Effort: Pulmonary effort is normal. No respiratory distress.     Breath sounds: Normal breath sounds. No wheezing.  Abdominal:     General: Bowel sounds are normal. There is no distension.     Palpations: Abdomen is soft. There is no mass.     Tenderness: There is no abdominal tenderness. There is no guarding.  Musculoskeletal:        General: Normal range of motion.     Cervical back: Normal range of motion and neck supple.  Lymphadenopathy:     Cervical: No cervical adenopathy.  Skin:    General: Skin is warm and dry.     Capillary Refill: Capillary refill takes less than 2 seconds.  Neurological:     Mental Status: He is alert and oriented to person, place, and time.       Results:  PHQ-9:  Flowsheet Row Clinical Support from 10/31/2020 in [facility] at [facility]  PHQ-9 Total Score 0         Assessment: 66 y.o. here for routine annual physical examination.  Plan: Problem List Items Addressed This Visit       Cardiovascular and Mediastinum   Essential hypertension   Relevant Medications   amLODipine (NORVASC) 10 MG tablet     Endocrine   Type 2 diabetes mellitus with other specified complication (HCC)   Relevant Orders    Hemoglobin A1c   Comprehensive metabolic panel   Microalbumin / creatinine urine ratio     Other   Hyperlipidemia   Relevant Medications   amLODipine (NORVASC) 10 MG tablet   Other Relevant Orders   Comprehensive metabolic panel   Lipid panel   Gout   Relevant Orders   Comprehensive metabolic panel   Uric Acid   Microcytic anemia   Relevant Orders   CBC   Myalgia due to statin   Relevant Orders   Lipid panel   Alcohol use   Other Visit Diagnoses     Annual physical exam    -  Primary   Screening for prostate cancer       Relevant Orders   PSA, Medicare       Screening: -- Blood pressure  screen  controlled -- cholesterol screening: will obtain -- Weight screening: overweight: continue to monitor -- Diabetes Screening: will obtain -- Nutrition: normal - Encouraged healthy diet  The 10-year ASCVD risk score (Arnett DK, et al., 2019) is: 28.4%   Values used to calculate the score:     Age: 3 years     Sex: Male     Is Non-Hispanic African American: Yes     Diabetic: Yes     Tobacco smoker: No     Systolic Blood Pressure: 462 mmHg     Is BP treated: Yes     HDL Cholesterol: 41.2 mg/dL     Total Cholesterol: 171 mg/dL  -- ASA 81 mg discussed if CVD risk >10% age 48-59 and willing to take for 10 years -- Statin therapy for Age 59-75 with CVD risk >7.5%  Psych -- Depression screening (PHQ-9): negative  Safety -- tobacco screening: not using -- alcohol screening:  encouraged cutting back -- no evidence of domestic violence or intimate partner violence.  Cancer Screening -- Prostate (age 10-69) ordered -- Colon (age 53-75)  he will call as due -- Lung not indicated   Immunizations Immunization History  Administered Date(s) Administered   PFIZER Comirnaty(Gray Top)Covid-19 Tri-Sucrose Vaccine 09/26/2020   PFIZER(Purple Top)SARS-COV-2 Vaccination 09/28/2019, 10/19/2019   Pneumococcal Conjugate-13 03/07/2017   Pneumococcal Polysaccharide-23 08/31/2019    Tdap 03/07/2017    -- flu vaccine not in season -- TDAP q10 years up to date -- Shingles (age >30) not up to date - advised getting -- PPSV-23 (19-64 with chronic disease or smoking) up to date -- PCV-13 (age >72) - one dose followed by PPSV-23 1 year later up to date -- Covid-19 Vaccine up to date  Encouraged regular vision and dental screening. Encouraged healthy exercise and diet.   Lesleigh Noe

## 2022-01-09 ENCOUNTER — Other Ambulatory Visit: Payer: Self-pay | Admitting: Family Medicine

## 2022-01-09 DIAGNOSIS — R972 Elevated prostate specific antigen [PSA]: Secondary | ICD-10-CM

## 2022-01-11 ENCOUNTER — Telehealth: Payer: Self-pay | Admitting: Family Medicine

## 2022-01-11 ENCOUNTER — Ambulatory Visit (INDEPENDENT_AMBULATORY_CARE_PROVIDER_SITE_OTHER): Payer: Medicare HMO | Admitting: Family Medicine

## 2022-01-11 VITALS — BP 124/80 | HR 87 | Temp 96.9°F | Ht 70.75 in | Wt 249.0 lb

## 2022-01-11 DIAGNOSIS — E13319 Other specified diabetes mellitus with unspecified diabetic retinopathy without macular edema: Secondary | ICD-10-CM | POA: Insufficient documentation

## 2022-01-11 DIAGNOSIS — E1169 Type 2 diabetes mellitus with other specified complication: Secondary | ICD-10-CM | POA: Diagnosis not present

## 2022-01-11 DIAGNOSIS — M791 Myalgia, unspecified site: Secondary | ICD-10-CM

## 2022-01-11 DIAGNOSIS — E1122 Type 2 diabetes mellitus with diabetic chronic kidney disease: Secondary | ICD-10-CM

## 2022-01-11 DIAGNOSIS — T466X5A Adverse effect of antihyperlipidemic and antiarteriosclerotic drugs, initial encounter: Secondary | ICD-10-CM | POA: Diagnosis not present

## 2022-01-11 DIAGNOSIS — E11319 Type 2 diabetes mellitus with unspecified diabetic retinopathy without macular edema: Secondary | ICD-10-CM

## 2022-01-11 DIAGNOSIS — N182 Chronic kidney disease, stage 2 (mild): Secondary | ICD-10-CM

## 2022-01-11 MED ORDER — METFORMIN HCL 500 MG PO TABS: 1000.0000 mg | ORAL_TABLET | Freq: Two times a day (BID) | ORAL | 1 refills | Status: DC

## 2022-01-11 MED ORDER — BLOOD GLUCOSE MONITOR KIT: PACK | 0 refills | Status: DC

## 2022-01-11 MED ORDER — EMPAGLIFLOZIN 10 MG PO TABS: 10.0000 mg | ORAL_TABLET | Freq: Every day | ORAL | 0 refills | Status: DC

## 2022-01-11 NOTE — Assessment & Plan Note (Signed)
Focus on DM control. Cont eye exams.

## 2022-01-11 NOTE — Progress Notes (Signed)
Subjective:     Jeremy Andrews is a 66 y.o. male presenting for Follow-up (DM )     HPI   #Diabetes - is urinating more often but not much different -    Review of Systems   Social History   Tobacco Use  Smoking Status Former   Packs/day: 0.50   Years: 25.00   Total pack years: 12.50   Types: Cigarettes   Quit date: 1990   Years since quitting: 33.4  Smokeless Tobacco Never        Objective:    BP Readings from Last 3 Encounters:  01/11/22 124/80  01/08/22 120/80  05/03/20 110/76   Wt Readings from Last 3 Encounters:  01/11/22 249 lb (112.9 kg)  01/08/22 246 lb 4 oz (111.7 kg)  05/03/20 242 lb (109.8 kg)    BP 124/80   Pulse 87   Temp (!) 96.9 F (36.1 C) (Temporal)   Ht 5' 10.75" (1.797 m)   Wt 249 lb (112.9 kg)   SpO2 100%   BMI 34.97 kg/m    Physical Exam Constitutional:      Appearance: Normal appearance. He is not ill-appearing or diaphoretic.  HENT:     Right Ear: External ear normal.     Left Ear: External ear normal.     Nose: Nose normal.  Eyes:     General: No scleral icterus.    Extraocular Movements: Extraocular movements intact.     Conjunctiva/sclera: Conjunctivae normal.  Cardiovascular:     Rate and Rhythm: Normal rate and regular rhythm.     Heart sounds: Murmur heard.  Pulmonary:     Effort: Pulmonary effort is normal. No respiratory distress.     Breath sounds: Normal breath sounds. No wheezing.  Musculoskeletal:     Cervical back: Neck supple.  Skin:    General: Skin is warm and dry.  Neurological:     Mental Status: He is alert. Mental status is at baseline.  Psychiatric:        Mood and Affect: Mood normal.           Assessment & Plan:   Problem List Items Addressed This Visit       Endocrine   Type 2 diabetes mellitus with other specified complication ([place]) - Primary    Lab Results  Component Value Date   HGBA1C 10.2 (H) 01/08/2022  Significantly worse. He would like to avoid injectables.  Discussed increasing metformin to 1000 mg bid. Start jardiance 10 mg daily. Check fasting CBG. Nutrition/eduction consult. Pharmacy consult for 1 month check in.       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 Cont eye exams.       Relevant Medications   metFORMIN (GLUCOPHAGE) 500 MG tablet   empagliflozin (JARDIANCE) 10 MG TABS tablet   CKD stage 2 due to type 2 diabetes mellitus (HCC)    GFR normal but proteinuria. Unfortunately pt with angioedema with lisinopril. Will continue to monitor and repeat in 3 months.       Relevant Medications   metFORMIN (GLUCOPHAGE) 500 MG tablet   empagliflozin (JARDIANCE) 10 MG TABS tablet     Other  Myalgia due to statin    He is willing to consider trying a weekly statin. Will get him started on new diabetes medication and address statin therapy in 1 month.         Return in about 3 months (around 04/13/2022) for diabetes.  Lesleigh Noe, MD

## 2022-01-11 NOTE — Assessment & Plan Note (Signed)
Lab Results  Component Value Date   HGBA1C 10.2 (H) 01/08/2022   Significantly worse. He would like to avoid injectables. Discussed increasing metformin to 1000 mg bid. Start jardiance 10 mg daily. Check fasting CBG. Nutrition/eduction consult. Pharmacy consult for 1 month check in.

## 2022-01-11 NOTE — Assessment & Plan Note (Signed)
GFR normal but proteinuria. Unfortunately pt with angioedema with lisinopril. Will continue to monitor and repeat in 3 months.

## 2022-01-11 NOTE — Progress Notes (Signed)
  Chronic Care Management   Note  01/11/2022 Name: Maxximus Gotay MRN: [id number] DOB: 07/03/1956  Maciah Schweigert is a 66 y.o. year old male who is a primary care patient of Einar Pheasant, Jobe Marker, MD. I reached out to Gaynelle Arabian by phone today in response to a referral sent by Mr. Tramon Mcaleer's PCP, Lesleigh Noe, MD.   Mr. Dowe was given information about Chronic Care Management services today including:  CCM service includes personalized support from designated clinical staff supervised by his physician, including individualized plan of care and coordination with other care providers 24/7 contact phone numbers for assistance for urgent and routine care needs. Service will only be billed when office clinical staff spend 20 minutes or more in a month to coordinate care. Only one practitioner may furnish and bill the service in a calendar month. The patient may stop CCM services at any time (effective at the end of the month) by phone call to the office staff.   Patient agreed to services and verbal consent obtained.   Follow up plan:REFERRAL/NO COPAY   [place]

## 2022-01-11 NOTE — Patient Instructions (Addendum)
Diabetes - increase metformin - start Jardiance - Nutrition referral - pharmacy referral - return 3 months - glucose monitoring at home  Diabetes is treated with diet and medication- avoid sugar (especially sugary beverages like soda and sweet tea), carbohydrates - like baked goods and bread. Try to eat lean protein (fish and chicken) and lots of green veggies. You can go to Diabetes.org for more information on dietary changes and call the clinic and I can place a referral to see a diabetes nutritionist.   Increase metformin   Week 1: Take 1000 mg (2 tablets) in the morning and 500 mg in the evening with food Week 2: Take 1000 mg (2 tablets) in the morning and evening with food  Glucometer to pharmacy - check sugar level Fasting 2-3 times per week - goal is <140   Referrals to Pharmacy and Diabetes Education   #Referral I have placed a referral to a specialist for you. You should receive a phone call from the specialty office. Make sure your voicemail is not full and that if you are able to answer your phone to unknown or new numbers.   It may take up to 2 weeks to hear about the referral. If you do not hear anything in 2 weeks, please call our office and ask to speak with the referral coordinator.

## 2022-01-11 NOTE — Assessment & Plan Note (Signed)
He is willing to consider trying a weekly statin. Will get him started on new diabetes medication and address statin therapy in 1 month.

## 2022-01-16 ENCOUNTER — Ambulatory Visit (INDEPENDENT_AMBULATORY_CARE_PROVIDER_SITE_OTHER): Payer: Medicare HMO | Admitting: Pharmacist

## 2022-01-16 DIAGNOSIS — M1A0790 Idiopathic chronic gout, unspecified ankle and foot, without tophus (tophi): Secondary | ICD-10-CM

## 2022-01-16 DIAGNOSIS — E782 Mixed hyperlipidemia: Secondary | ICD-10-CM

## 2022-01-16 DIAGNOSIS — I10 Essential (primary) hypertension: Secondary | ICD-10-CM

## 2022-01-16 DIAGNOSIS — K219 Gastro-esophageal reflux disease without esophagitis: Secondary | ICD-10-CM

## 2022-01-16 DIAGNOSIS — E1169 Type 2 diabetes mellitus with other specified complication: Secondary | ICD-10-CM

## 2022-01-16 NOTE — Patient Instructions (Addendum)
Visit Information  Phone number for Pharmacist: [phone]   Goals Addressed   None     Care Plan : [place] plan  Updates made by Charlton Haws, RPH since 01/18/2022 12:00 AM     Problem: Hypertension, Hyperlipidemia, Diabetes, GERD, and Gout   Priority: High     Long-Range Goal: Disease mgmt   Start Date: 01/18/2022  Expected End Date: 01/19/2023  This Visit's Progress: On track  Priority: High  Note:   Current Barriers:  Unable to maintain control of DM  Pharmacist Clinical Goal(s):  Patient will achieve improvement in DM as evidenced by BG/A1c through collaboration with PharmD and provider.   Interventions: 1:1 collaboration with Lesleigh Noe, MD regarding development and update of comprehensive plan of care as evidenced by provider attestation and co-signature Inter-disciplinary care team collaboration (see longitudinal plan of care) Comprehensive medication review performed; medication list updated in electronic medical record  Hypertension (BP goal <130/80) -Controlled - BP at goal in recent OV; pt affirms compliance as prescribed -Current treatment: Amlodipine 10 mg daily - Appropriate, Effective, Safe, Accessible Labetolol 100 mg - 1/2 tab BID - Appropriate, Effective, Safe, Accessible -Medications previously tried: lisinopril  -Educated on BP goals and benefits of medications for prevention of heart attack, stroke and kidney damage; -Counseled to monitor BP at home periodically -Recommended to continue current medication  Hyperlipidemia: (LDL goal < 100) -Not ideally controlled - LDL 119 (01/2022); assess statin therapy 1 month with PCP -Current treatment: None -Medications previously tried: none  -Educated on Cholesterol goals;  -Recommend statin - address at 1 month f/u  Diabetes (A1c goal <7%) -Uncontrolled - A1c 10.2% (01/2022), previously 6.2% (08/2019); pt has increased metformin to 1500 mg/day (2 am, 1 pM) and reports diarrhea with  this change; he has not received Jardiance from mail order yet -Current home glucose readings: not checking, pt has not received glucometer yet -Current medications: Metformin 500 mg - 2 tab BID - Appropriate, Query Effective Jardiance 10 mg daily -Appropriate, Query Effective Testing supplies -Medications previously tried: n/a  -Current meal patterns: typically 2 meals per day, dinner is biggest meal breakfast: eggs  dinner: chili beans, potatoes, green beans, peas, carrots snacks: jolly ranchers (sometimes 1 bag/day when he is driving) drinks: fruit juice, water, black coffee -Current exercise: limited -Educated on A1c and blood sugar goals; Complications of diabetes including kidney damage, retinal damage, and cardiovascular disease; Exercise goal of 150 minutes per week; Benefits of routine self-monitoring of blood sugar; Carbohydrate counting and/or plate method -Demonstrated how to use glucometer -Advised to delay further increasing metformin until GI effects subside -Advised to start Jardiance 1 tablet daily when he receives it -Advised to check fasting BG daily; check BG after meals periodically  Gout (Goal: prevent flares) -Controlled - pt reports last flare over 2 years ago -Current treatment  Allopurinol 100 mg daily - Appropriate, Effective, Safe, Accessible -Medications previously tried: n/a  -Recommended to continue current medication  GERD (Goal: minimize symptoms of reflux ) -Controlled - pt denies issue with relfux, wonders if he still needs PPI -Hx of Bleeds/ulcers: No -Current treatment  Omeprazole 20 mg daily -Medications previously tried: none reported  -Recommend taper omeprazole: take every other day for 1-2 weeks, then stop and take PRN; can resume if symptoms return  Health Maintenance -Vaccine gaps: Shingrix -Current therapy:  Celecoxib 200 mg daily PRN  Cetirizine 10 mg Ferrous sulfate 325 mg daily -Patient is satisfied with current therapy and  denies issues -Recommended  to continue current medication   Patient Goals/Self-Care Activities Patient will:  - take medications as prescribed as evidenced by patient report and record review focus on medication adherence by routine check glucose daily, document, and provide at future appointments target a minimum of 150 minutes of moderate intensity exercise weekly engage in dietary modifications by limit candy; use plate method to plan meals      The patient verbalized understanding of instructions, educational materials, and care plan provided today and DECLINED offer to receive copy of patient instructions, educational materials, and care plan.  Telephone follow up appointment with pharmacy team member scheduled for: 1 month  Charlene Brooke, PharmD, [facility] Clinical Pharmacist [facility] [phone]

## 2022-01-16 NOTE — Progress Notes (Signed)
Chronic Care Management Pharmacy Note  01/18/2022 Name:  Jeremy Andrews MRN:  [id number] DOB:  1956/03/31  Summary: CCM Initial visit -Reviewed medications; pt affirmed adherence -DM: A1c 10.2% (01/2022); pt increased metformin 500 mg to 2 tab AM, 1 tab PM last week, he has had some diarrhea with this; he has not received Jardiance or BG testing supplies yet, he denies medication cost concerns with new Jardiance -He reports dietary sugar intake is high - jolly ranchers, fruit juices every day; he has been referred to nutrition but has not heard from them yet; he is not exercising -Will address statin therapy at follow up.  Recommendations/Changes made from today's visit: -Advised to delay increasing metformin to 2 tab BID until GI symptoms have resolved -Advised to check fasting BG once daily and bring log to PCP appt -Advised to limit candy/fruit juice; discussed plate method for optimizing meal planning -Advised to start exercise - start with 5 min of walking per day, work up to 30 min 5 days a week  Plan: -[name] will call patient 2 weeks for DM update -Pharmacist follow up televisit scheduled for 1 month -PCP F/U 04/15/22    Subjective: Jeremy Andrews is an 66 y.o. year old male who is a primary patient of Cody, Jobe Marker, MD.  The CCM team was consulted for assistance with disease management and care coordination needs.    Engaged with patient face to face for initial visit in response to provider referral for pharmacy case management and/or care coordination services.   Consent to Services:  The patient was given the following information about Chronic Care Management services today, agreed to services, and gave verbal consent: 1. CCM service includes personalized support from designated clinical staff supervised by the primary care provider, including individualized plan of care and coordination with other care providers 2. 24/7 contact phone numbers for assistance for  urgent and routine care needs. 3. Service will only be billed when office clinical staff spend 20 minutes or more in a month to coordinate care. 4. Only one practitioner may furnish and bill the service in a calendar month. 5.The patient may stop CCM services at any time (effective at the end of the month) by phone call to the office staff. 6. The patient will be responsible for cost sharing (co-pay) of up to 20% of the service fee (after annual deductible is met). Patient agreed to services and consent obtained.  Patient Care Team: Lesleigh Noe, MD as PCP - General (Family Medicine) Michiel Cowboy, MD as Referring Physician (Gastroenterology) Dannielle Karvonen, RN as Case Manager Charlton Haws, [facility] as Pharmacist (Pharmacist)  Recent office visits: 01/11/22 Dr Einar Pheasant OV: f/u DM. A1C 10.2%. Increase metformin to 1000 mg BID, Start Jardiance 10 mg. Check fasting BG. Address statin therapy 1 month.  01/08/22 Dr Einar Pheasant OV: CPE- referred to urology for PSA.  Recent consult visits: [facility] visits: None in previous 6 months   Objective:  Lab Results  Component Value Date   CREATININE 0.88 01/08/2022   BUN 8 01/08/2022   GFR 88.62 01/08/2022   GFRNONAA >60 05/04/2019   GFRAA >60 05/04/2019   NA 136 01/08/2022   K 4.5 01/08/2022   CALCIUM 9.7 01/08/2022   CO2 28 01/08/2022   GLUCOSE 226 (H) 01/08/2022    Lab Results  Component Value Date/Time   HGBA1C 10.2 (H) 01/08/2022 10:04 AM   HGBA1C 6.2 08/31/2019 11:04 AM   HGBA1C 5.6 09/21/2018 12:00 AM  GFR 88.62 01/08/2022 10:04 AM   GFR 117.13 08/31/2019 11:04 AM   MICROALBUR 166.3 (H) 01/08/2022 10:04 AM   MICROALBUR 39.4 (H) 08/31/2019 12:36 PM    Last diabetic Eye exam:  Lab Results  Component Value Date/Time   HMDIABEYEEXA Retinopathy (A) 06/26/2021 12:00 AM   HMDIABEYEEXA Retinopathy (A) 06/26/2021 12:00 AM    Last diabetic Foot exam: No results found for: "HMDIABFOOTEX"   Lab Results  Component Value Date   CHOL 184  01/08/2022   HDL 31.20 (L) 01/08/2022   LDLCALC 119 (H) 01/08/2022   TRIG 168.0 (H) 01/08/2022   CHOLHDL 6 01/08/2022       Latest Ref Rng & Units 01/08/2022   10:04 AM 08/31/2019   11:04 AM 09/21/2018   12:00 AM  Hepatic Function  Total Protein 6.0 - 8.3 g/dL 7.2  7.0    Albumin 3.5 - 5.2 g/dL 3.9  4.0    AST 0 - 37 U/L 45  37  123      ALT 0 - 53 U/L 36  28  57      Alk Phosphatase 39 - 117 U/L 61  62  100      Total Bilirubin 0.2 - 1.2 mg/dL 0.6  0.4       This result is from an external source.    No results found for: "TSH", "FREET4"     Latest Ref Rng & Units 01/08/2022   10:04 AM 11/01/2019    9:49 AM 08/31/2019   11:04 AM  CBC  WBC 4.0 - 10.5 K/uL 4.3  6.6  5.8   Hemoglobin 13.0 - 17.0 g/dL 12.7  12.5  10.8   Hematocrit 39.0 - 52.0 % 39.8  37.8  33.5   Platelets 150.0 - 400.0 K/uL 168.0  197.0  224.0     No results found for: "VD25OH"  Clinical ASCVD: No  The 10-year ASCVD risk score (Arnett DK, et al., 2019) is: 32.7%   Values used to calculate the score:     Age: 66 years     Sex: Male     Is Non-Hispanic African American: Yes     Diabetic: Yes     Tobacco smoker: No     Systolic Blood Pressure: 628 mmHg     Is BP treated: Yes     HDL Cholesterol: 31.2 mg/dL     Total Cholesterol: 184 mg/dL       11/01/2021   10:34 AM 02/16/2021    9:22 AM 10/31/2020   10:35 AM  Depression screen PHQ 2/9  Decreased Interest 0 0 0  Down, Depressed, Hopeless 0 0 0  PHQ - 2 Score 0 0 0  Altered sleeping   0  Tired, decreased energy   0  Change in appetite   0  Feeling bad or failure about yourself    0  Trouble concentrating   0  Moving slowly or fidgety/restless   0  Suicidal thoughts   0  PHQ-9 Score   0  Difficult doing work/chores   Not difficult at all     Social History   Tobacco Use  Smoking Status Former   Packs/day: 0.50   Years: 25.00   Total pack years: 12.50   Types: Cigarettes   Quit date: 1990   Years since quitting: 33.4  Smokeless Tobacco  Never   BP Readings from Last 3 Encounters:  01/11/22 124/80  01/08/22 120/80  05/03/20 110/76   Pulse Readings from Last 3  Encounters:  01/11/22 87  01/08/22 (!) 106  05/03/20 80   Wt Readings from Last 3 Encounters:  01/11/22 249 lb (112.9 kg)  01/08/22 246 lb 4 oz (111.7 kg)  05/03/20 242 lb (109.8 kg)   BMI Readings from Last 3 Encounters:  01/11/22 34.97 kg/m  01/08/22 34.59 kg/m  05/03/20 33.75 kg/m    Assessment/Interventions: Review of patient past medical history, allergies, medications, health status, including review of consultants reports, laboratory and other test data, was performed as part of comprehensive evaluation and provision of chronic care management services.   SDOH:  (Social Determinants of Health) assessments and interventions performed: No - done March 2023  SDOH Screenings   Alcohol Screen: Low Risk  (11/01/2021)   Alcohol Screen    Last Alcohol Screening Score (AUDIT): 0  Depression (PHQ2-9): Low Risk  (11/01/2021)   Depression (PHQ2-9)    PHQ-2 Score: 0  Financial Resource Strain: Low Risk  (11/01/2021)   Overall Financial Resource Strain (CARDIA)    Difficulty of Paying Living Expenses: Not hard at all  Food Insecurity: No Food Insecurity (11/01/2021)   Hunger Vital Sign    Worried About Running Out of Food in the Last Year: Never true    Ran Out of Food in the Last Year: Never true  Housing: Low Risk  (11/01/2021)   Housing    Last Housing Risk Score: 0  Physical Activity: Sufficiently Active (11/01/2021)   Exercise Vital Sign    Days of Exercise per Week: 5 days    Minutes of Exercise per Session: 30 min  Social Connections: Moderately Integrated (11/01/2021)   Social Connection and Isolation Panel [NHANES]    Frequency of Communication with Friends and Family: More than three times a week    Frequency of Social Gatherings with Friends and Family: Once a week    Attends Religious Services: 1 to 4 times per year    Active Member of  Clubs or Organizations: No    Attends Archivist Meetings: 1 to 4 times per year    Marital Status: Separated  Stress: No Stress Concern Present (10/31/2020)   [place] of Stress : Not at all  Tobacco Use: Medium Risk (11/01/2021)   Patient History    Smoking Tobacco Use: Former    Smokeless Tobacco Use: Never    Passive Exposure: Not on file  Transportation Needs: No Transportation Needs (11/01/2021)   PRAPARE - Hydrologist (Medical): No    Lack of Transportation (Non-Medical): No    CCM Care Plan  Allergies  Allergen Reactions   Lisinopril Swelling   Statins     Severe myopathy, in wheel chair Severe myopathy, in wheel chair    Medications Reviewed Today     Reviewed by Charlton Haws, [facility] (Pharmacist) on 01/16/22 at 1042  Med List Status: <None>   Medication Order Taking? Sig Documenting Provider Last Dose Status Informant  allopurinol (ZYLOPRIM) 100 MG tablet [id number] Yes Take 1 tablet (100 mg total) by mouth daily. Lesleigh Noe, MD Taking Active   amLODipine (NORVASC) 10 MG tablet [id number] Yes Take 1 tablet (10 mg total) by mouth daily. Lesleigh Noe, MD Taking Active   blood glucose meter kit and supplies KIT [id number] No Dispense based on patient and insurance preference. Use up to four times daily as directed.  Patient not taking: Reported on 01/16/2022   Lesleigh Noe,  MD Not Taking Active            Med Note Malena Catholic Jan 16, 2022 10:41 AM) Not started yet - waiting on mail order delivery  celecoxib (CELEBREX) 200 MG capsule [id number] Yes Take 1 capsule (200 mg total) by mouth daily.  Patient taking differently: Take 200 mg by mouth as needed for mild pain or moderate pain.   Lesleigh Noe, MD Taking Active            Med Note Loreen Freud   Tue Jan 08, 2022  9:21 AM)    cetirizine (ZYRTEC) 10 MG tablet  [id number] Yes Take 1 tablet (10 mg total) by mouth daily. Lesleigh Noe, MD Taking Active   empagliflozin (JARDIANCE) 10 MG TABS tablet [id number] No Take 1 tablet (10 mg total) by mouth daily before breakfast.  Patient not taking: Reported on 01/16/2022   Lesleigh Noe, MD Not Taking Active            Med Note Malena Catholic Jan 16, 2022 10:41 AM) Not started yet - waiting on mail order delivery  Ferrous Sulfate (IRON) 325 (65 Fe) MG TABS [id number] Yes Take 1 tablet (325 mg total) by mouth daily. Lesleigh Noe, MD Taking Active   labetalol (NORMODYNE) 100 MG tablet [id number] Yes TAKE 1/2 TABLET TWICE DAILY Lesleigh Noe, MD Taking Active   metFORMIN (GLUCOPHAGE) 500 MG tablet [id number] Yes Take 2 tablets (1,000 mg total) by mouth 2 (two) times daily with a meal. Lesleigh Noe, MD Taking Active            Med Note Charlton Haws   Wed Jan 16, 2022 10:42 AM) Take 2 tab AM, 1 tab PM currently, planning to increase to 2 tab BID in 1-2 weeks  omeprazole (PRILOSEC) 20 MG capsule [id number] Yes Take 1 capsule (20 mg total) by mouth daily. 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[place]) 01/11/2022   CKD stage 2 due to type 2 diabetes mellitus ([place]) 01/11/2022   Alcohol use 01/08/2022   Type 2 diabetes mellitus with other specified complication ([place]) 30/16/0109   Myalgia due to statin 05/03/2020   Microcytic anemia 11/01/2019   Dyspnea on exertion 08/31/2019   Pain due to onychomycosis of toenails of both feet 02/18/2019   Essential hypertension 02/09/2019   Prediabetes 02/09/2019   Hyperlipidemia 02/09/2019   GERD (gastroesophageal reflux disease) 02/09/2019   Gout 02/09/2019   Environmental allergies 02/09/2019    Immunization History  Administered Date(s) Administered   PFIZER Comirnaty(Gray Top)Covid-19 Tri-Sucrose Vaccine 09/26/2020   PFIZER(Purple Top)SARS-COV-2  Vaccination 09/28/2019, 10/19/2019   Pneumococcal Conjugate-13 03/07/2017   Pneumococcal Polysaccharide-23 08/31/2019   Tdap 03/07/2017    Conditions to be addressed/monitored:  Hypertension, Hyperlipidemia, Diabetes, GERD, and Gout  Care Plan : [place] plan  Updates made by Charlton Haws, [place] since 01/18/2022 12:00 AM     Problem: Hypertension, Hyperlipidemia, Diabetes, GERD, and Gout   Priority: High     Long-Range Goal: Disease mgmt   Start Date: 01/18/2022  Expected End Date: 01/19/2023  This Visit's Progress: On track  Priority: High  Note:   Current Barriers:  Unable to maintain control of DM  Pharmacist Clinical Goal(s):  Patient will achieve improvement in DM as  evidenced by BG/A1c through collaboration with PharmD and provider.   Interventions: 1:1 collaboration with Lesleigh Noe, MD regarding development and update of comprehensive plan of care as evidenced by provider attestation and co-signature Inter-disciplinary care team collaboration (see longitudinal plan of care) Comprehensive medication review performed; medication list updated in electronic medical record  Hypertension (BP goal <130/80) -Controlled - BP at goal in recent OV; pt affirms compliance as prescribed -Current treatment: Amlodipine 10 mg daily - Appropriate, Effective, Safe, Accessible Labetolol 100 mg - 1/2 tab BID - Appropriate, Effective, Safe, Accessible -Medications previously tried: lisinopril  -Educated on BP goals and benefits of medications for prevention of heart attack, stroke and kidney damage; -Counseled to monitor BP at home periodically -Recommended to continue current medication  Hyperlipidemia: (LDL goal < 100) -Not ideally controlled - LDL 119 (01/2022); assess statin therapy 1 month with PCP -Current treatment: None -Medications previously tried: none  -Educated on Cholesterol goals;  -Recommend statin - address at 1 month f/u  Diabetes (A1c goal  <7%) -Uncontrolled - A1c 10.2% (01/2022), previously 6.2% (08/2019); pt has increased metformin to 1500 mg/day (2 am, 1 pM) and reports diarrhea with this change; he has not received Jardiance from mail order yet -Current home glucose readings: not checking, pt has not received glucometer yet -Current medications: Metformin 500 mg - 2 tab BID - Appropriate, Query Effective Jardiance 10 mg daily -Appropriate, Query Effective Testing supplies -Medications previously tried: n/a  -Current meal patterns: typically 2 meals per day, dinner is biggest meal breakfast: eggs  dinner: chili beans, potatoes, green beans, peas, carrots snacks: jolly ranchers (sometimes 1 bag/day when he is driving) drinks: fruit juice, water, black coffee -Current exercise: limited -Educated on A1c and blood sugar goals; Complications of diabetes including kidney damage, retinal damage, and cardiovascular disease; Exercise goal of 150 minutes per week; Benefits of routine self-monitoring of blood sugar; Carbohydrate counting and/or plate method -Demonstrated how to use glucometer -Advised to delay further increasing metformin until GI effects subside -Advised to start Jardiance 1 tablet daily when he receives it -Advised to check fasting BG daily; check BG after meals periodically  Gout (Goal: prevent flares) -Controlled - pt reports last flare over 2 years ago -Current treatment  Allopurinol 100 mg daily - Appropriate, Effective, Safe, Accessible -Medications previously tried: n/a  -Recommended to continue current medication  GERD (Goal: minimize symptoms of reflux ) -Controlled - pt denies issue with relfux, wonders if he still needs PPI -Hx of Bleeds/ulcers: No -Current treatment  Omeprazole 20 mg daily -Medications previously tried: none reported  -Recommend taper omeprazole: take every other day for 1-2 weeks, then stop and take PRN; can resume if symptoms return  Health Maintenance -Vaccine gaps:  Shingrix -Current therapy:  Celecoxib 200 mg daily PRN  Cetirizine 10 mg Ferrous sulfate 325 mg daily -Patient is satisfied with current therapy and denies issues -Recommended to continue current medication   Patient Goals/Self-Care Activities Patient will:  - take medications as prescribed as evidenced by patient report and record review focus on medication adherence by routine check glucose daily, document, and provide at future appointments target a minimum of 150 minutes of moderate intensity exercise weekly engage in dietary modifications by limit candy; use plate method to plan meals      Medication Assistance: None required.  Patient affirms current coverage meets needs.  Compliance/Adherence/Medication fill history: Care Gaps: Colonoscopy  Star-Rating Drugs: Metformin - PDC 27%; LF 01/02/22 x 90 ds, 05/09/21 x 90 ds Jardiance -  [place] 33%  Medication Access: Within the past 30 days, how often has patient missed a dose of medication? 0 Is a pillbox or other method used to improve adherence? No  Factors that may affect medication adherence? no barriers identified Are meds synced by current pharmacy? No  Are meds delivered by current pharmacy? Yes  Does patient experience delays in picking up medications due to transportation concerns? No   [facility] Reviewed: Is patient disadvantaged to use [facility]?: Yes  Current Rx insurance plan: [employer] [place] Name and location of Current pharmacy:  [id number] [place]Phone: [phone]: [phone] [place]Phone: [phone]: [phone] [facility] services reviewed with patient today?: No  Patient requests to transfer care to [facility]?: No  Reason patient declined to change pharmacies: Disadvantaged due to  insurance/mail order   Care Plan and Follow Up Patient Decision:  Patient agrees to Care Plan and Follow-up.  Plan: Telephone follow up appointment with care management team member scheduled for:  1 month  LCharlene Brooke PharmD, BAlexander HospitalClinical Pharmacist LPoydrasPrimary Care at [phone]

## 2022-01-21 ENCOUNTER — Ambulatory Visit (INDEPENDENT_AMBULATORY_CARE_PROVIDER_SITE_OTHER): Payer: Medicare HMO | Admitting: Podiatry

## 2022-01-21 ENCOUNTER — Encounter: Payer: Self-pay | Admitting: Podiatry

## 2022-01-21 DIAGNOSIS — M79674 Pain in right toe(s): Secondary | ICD-10-CM | POA: Diagnosis not present

## 2022-01-21 DIAGNOSIS — M79675 Pain in left toe(s): Secondary | ICD-10-CM

## 2022-01-21 DIAGNOSIS — B351 Tinea unguium: Secondary | ICD-10-CM

## 2022-01-21 DIAGNOSIS — E1122 Type 2 diabetes mellitus with diabetic chronic kidney disease: Secondary | ICD-10-CM | POA: Diagnosis not present

## 2022-01-21 DIAGNOSIS — E119 Type 2 diabetes mellitus without complications: Secondary | ICD-10-CM

## 2022-01-21 DIAGNOSIS — N182 Chronic kidney disease, stage 2 (mild): Secondary | ICD-10-CM | POA: Diagnosis not present

## 2022-01-21 NOTE — Progress Notes (Signed)
This patient returns to my office for at risk foot care.  This patient requires this care by a professional since this patient will be at risk due to having diabetes according to patient.  This patient is unable to cut nails himself since the patient cannot reach his nails.These nails are painful walking and wearing shoes.  This patient presents for at risk foot care today.  General Appearance  Alert, conversant and in no acute stress.  Vascular  Dorsalis pedis and posterior tibial  pulses are weakly  palpable  bilaterally.  Capillary return is within normal limits  bilaterally. Temperature is within normal limits  bilaterally.  Neurologic  Senn-Weinstein monofilament wire test within normal limits  bilaterally. Muscle power within normal limits bilaterally.  Nails Thick disfigured discolored nails with subungual debris  from hallux to fifth toes bilaterally. No evidence of bacterial infection or drainage bilaterally.  Orthopedic  No limitations of motion  feet .  No crepitus or effusions noted.  No bony pathology or digital deformities noted.  Skin  normotropic skin with no porokeratosis noted bilaterally.  No signs of infections or ulcers noted.     Onychomycosis  Pain in right toes  Pain in left toes  Consent was obtained for treatment procedures.   Mechanical debridement of nails 1-5  bilaterally performed with a nail nipper.  Filed with dremel without incident.    Return office visit    3 months                  Told patient to return for periodic foot care and evaluation due to potential at risk complications.   Achsah Mcquade DPM  

## 2022-01-22 ENCOUNTER — Encounter: Payer: Self-pay | Admitting: *Deleted

## 2022-01-22 ENCOUNTER — Encounter: Payer: Medicare HMO | Attending: Family Medicine | Admitting: *Deleted

## 2022-01-22 VITALS — BP 142/90 | Ht 71.0 in | Wt 244.9 lb

## 2022-01-22 DIAGNOSIS — Z713 Dietary counseling and surveillance: Secondary | ICD-10-CM | POA: Diagnosis not present

## 2022-01-22 DIAGNOSIS — E1169 Type 2 diabetes mellitus with other specified complication: Secondary | ICD-10-CM | POA: Diagnosis not present

## 2022-01-22 DIAGNOSIS — E1165 Type 2 diabetes mellitus with hyperglycemia: Secondary | ICD-10-CM

## 2022-01-22 NOTE — Patient Instructions (Signed)
Check blood sugars 1 x day before breakfast or 2 hrs after supper every day Bring blood sugar records to the next class  Exercise:  Begin walking  for    10  minutes   3  days a week and gradually increase to 30 minutes 5 x week  Eat 3 meals day,   1  snack a day Space meals 4-6 hours apart Don't skip meals - eat 1 protein and 1 carbohydrate serving  Make an eye doctor appointment  Return for classes on:

## 2022-01-23 ENCOUNTER — Encounter: Payer: Self-pay | Admitting: Urology

## 2022-01-23 ENCOUNTER — Ambulatory Visit: Payer: Medicare HMO | Admitting: Urology

## 2022-01-23 VITALS — BP 144/90 | HR 98 | Ht 71.0 in | Wt 246.0 lb

## 2022-01-23 DIAGNOSIS — R972 Elevated prostate specific antigen [PSA]: Secondary | ICD-10-CM

## 2022-01-23 MED ORDER — TAMSULOSIN HCL 0.4 MG PO CAPS: 0.4000 mg | ORAL_CAPSULE | Freq: Every day | ORAL | 0 refills | Status: DC

## 2022-01-23 NOTE — Progress Notes (Signed)
01/23/2022 2:48 PM   Gaynelle Arabian 07-16-1956 [id number]  Referring provider: Lesleigh Noe, MD [place],  [place] [address]  Chief Complaint  Patient presents with   Elevated PSA    HPI: Jeremy Andrews is a 66 y.o. male referred for evaluation of an elevated PSA.  PSA 01/08/2022 elevated 9.23 No bothersome LUTS No family history prostate cancer Denies history of previous UTI, dysuria or gross hematuria   PMH: Past Medical History:  Diagnosis Date   Allergy    Colon polyps    Diabetes mellitus without complication ([place])    Gout    Hyperlipidemia    Hypertension     Surgical History: Past Surgical History:  Procedure Laterality Date   COLONOSCOPY W/ POLYPECTOMY N/A    NO PAST SURGERIES      Home Medications:  Allergies as of 01/23/2022       Reactions   Lisinopril Swelling   Statins    Severe myopathy, in wheel chair Severe myopathy, in wheel chair        Medication List        Accurate as of January 23, 2022  2:48 PM. If you have any questions, ask your nurse or doctor.          allopurinol 100 MG tablet Commonly known as: ZYLOPRIM Take 1 tablet (100 mg total) by mouth daily.   amLODipine 10 MG tablet Commonly known as: NORVASC Take 1 tablet (10 mg total) by mouth daily.   blood glucose meter kit and supplies Kit Dispense based on patient and insurance preference. Use up to four times daily as directed.   celecoxib 200 MG capsule Commonly known as: CELEBREX Take 1 capsule (200 mg total) by mouth daily. What changed:  when to take this reasons to take this   cetirizine 10 MG tablet Commonly known as: ZYRTEC Take 1 tablet (10 mg total) by mouth daily.   empagliflozin 10 MG Tabs tablet Commonly known as: Jardiance Take 1 tablet (10 mg total) by mouth daily before breakfast.   Iron 325 (65 Fe) MG Tabs Take 1 tablet (325 mg total) by mouth daily.   labetalol 100 MG tablet Commonly known as: NORMODYNE TAKE 1/2 TABLET TWICE  DAILY   metFORMIN 500 MG tablet Commonly known as: GLUCOPHAGE Take 2 tablets (1,000 mg total) by mouth 2 (two) times daily with a meal.   omeprazole 20 MG capsule Commonly known as: PRILOSEC Take 1 capsule (20 mg total) by mouth daily.        Allergies:  Allergies  Allergen Reactions   Lisinopril Swelling   Statins     Severe myopathy, in wheel chair Severe myopathy, in wheel chair    Family History: Family History  Problem Relation Age of Onset   Diabetes Mother    Hypertension Mother    Gout Mother    Other Mother        meningioma   Hypertension Father    Kidney disease Sister    Hypertension Sister    Kidney failure Sister    Other Sister        legally blind   Diabetes Sister    Breast cancer Sister    Diabetes Brother    Arthritis Brother    Gout Brother    Breast cancer Maternal Grandmother 60    Social History:  reports that he quit smoking about 33 years ago. His smoking use included cigarettes. He has a 12.50 pack-year smoking history. He  has never used smokeless tobacco. He reports current alcohol use of about 21.0 - 28.0 standard drinks of alcohol per week. He reports that he does not currently use drugs after having used the following drugs: Marijuana.   Physical Exam: BP (!) 144/90   Pulse 98   Ht _0  (1.803 m)   Wt 246 lb (111.6 kg)   BMI 34.31 kg/m   Constitutional:  Alert and oriented, No acute distress. HEENT: [place] AT Respiratory: Normal respiratory effort, no increased work of breathing. GU: Prostate 40 g, smooth without nodules Skin: No rashes, bruises or suspicious lesions. Neurologic: Grossly intact, no focal deficits, moving all 4 extremities. Psychiatric: Normal mood and affect.   Assessment & Plan:    1.  Elevated PSA Benign DRE Although PSA is a prostate cancer screening test he was informed that cancer is not the most common cause of an elevated PSA. Other potential causes including BPH and inflammation were discussed. He  was informed that the only way to adequately diagnose prostate cancer would be a transrectal ultrasound and biopsy of the prostate. The procedure was discussed including potential risks of bleeding and infection/sepsis. He was also informed that a negative biopsy does not conclusively rule out the possibility that prostate cancer may be present and that continued monitoring is required. The use of newer adjunctive blood tests including 4kScore was discussed. The use of multiparametric prostate MRI to identify lesions suspicious for high-grade prostate cancer and to also aid in targeted biopsy was also reviewed. Continued periodic surveillance was also discussed. Will initially repeat his PSA in 1 month and if persistently elevated he elects to schedule MRI Rx tamsulosin 0.4 mg daily x30 days   Abbie Sons, MD  [facility] [address], [place], [place] [address] [phone]

## 2022-01-23 NOTE — Progress Notes (Signed)
Diabetes Self-Management Education  Visit Type: First/Initial  Appt. Start Time: 1505 Appt. End Time: 1605  01/22/2022  [name], identified by name and date of birth, is a 66 y.o. male with a diagnosis of Diabetes: Type 2.   ASSESSMENT  Blood pressure (!) 142/90, height '5\' 11"'$  (1.803 m), weight 244 lb 14.4 oz (111.1 kg). Body mass index is 34.16 kg/m.   Diabetes Self-Management Education - 01/22/22 1641       Visit Information   Visit Type First/Initial      Initial Visit   Diabetes Type Type 2    Date Diagnosed 3 years ago    Are you currently following a meal plan? No    Are you taking your medications as prescribed? Yes      Health Coping   How would you rate your overall health? Good      Psychosocial Assessment   Patient Belief/Attitude about Diabetes Other (comment)   "ok"   What is the hardest part about your diabetes right now, causing you the most concern, or is the most worrisome to you about your diabetes?   Making healty food and beverage choices    Self-care barriers None    Self-management support Doctor's office;Family    Patient Concerns Glycemic Control;Medication;Nutrition/Meal planning;Monitoring;Weight Control;Healthy Lifestyle    Special Needs None    Preferred Learning Style Auditory;Hands on   talking/discussion   Learning Readiness Ready    How often do you need to have someone help you when you read instructions, pamphlets, or other written materials from your doctor or pharmacy? 1 - Never    What is the last grade level you completed in school? 14      Pre-Education Assessment   Patient understands the diabetes disease and treatment process. Needs Instruction    Patient understands incorporating nutritional management into lifestyle. Needs Instruction    Patient undertands incorporating physical activity into lifestyle. Needs Instruction    Patient understands using medications safely. Needs Instruction    Patient understands  monitoring blood glucose, interpreting and using results Needs Review    Patient understands prevention, detection, and treatment of acute complications. Needs Instruction    Patient understands prevention, detection, and treatment of chronic complications. Needs Review    Patient understands how to develop strategies to address psychosocial issues. Needs Instruction    Patient understands how to develop strategies to promote health/change behavior. Needs Instruction      Complications   Last HgB A1C per patient/outside source 10.2 %   01/08/2022   How often do you check your blood sugar? 1-2 times/day    Fasting Blood glucose range (mg/dL) 70-129;130-179   Pt reports FBG's 108-170 mg/dL.   Have you had a dilated eye exam in the past 12 months? No    Have you had a dental exam in the past 12 months? No    Are you checking your feet? No      Dietary Intake   Breakfast egg cups with meat    Snack (morning) 0-1 snack/day - chips, popcorn, sweet potato    Lunch skips    Dinner beef, chicken, pork, fish; sweet potato, peas, corn, beans, little pasta, squash, zucchini, reports eats most vegetables    Beverage(s) water, coffee      Activity / Exercise   Activity / Exercise Type ADL's      Patient Education   Previous Diabetes Education No    Disease Pathophysiology Definition of diabetes, type 1 and 2,  and the diagnosis of diabetes;Factors that contribute to the development of diabetes;Explored patient's options for treatment of their diabetes    Healthy Eating Role of diet in the treatment of diabetes and the relationship between the three main macronutrients and blood glucose level;Food label reading, portion sizes and measuring food.;Reviewed blood glucose goals for pre and post meals and how to evaluate the patients' food intake on their blood glucose level.;Meal timing in regards to the patients' current diabetes medication.;Effects of alcohol on blood glucose and safety factors with  consumption of alcohol.    Being Active Role of exercise on diabetes management, blood pressure control and cardiac health.    Medications Reviewed patients medication for diabetes, action, purpose, timing of dose and side effects.    Monitoring Purpose and frequency of SMBG.;Taught/discussed recording of test results and interpretation of SMBG.;Identified appropriate SMBG and/or A1C goals.    Chronic complications Relationship between chronic complications and blood glucose control    Diabetes Stress and Support Identified and addressed patients feelings and concerns about diabetes      Individualized Goals (developed by patient)   Reducing Risk Other (comment)   improve blood sugars, decrease medications, prevent diabetes complications, lose weight, lead a healthier lifestyle, become more fit     Outcomes   Expected Outcomes Demonstrated interest in learning. Expect positive outcomes    Future DMSE 4-6 wks         Individualized Plan for Diabetes Self-Management Training:   Learning Objective:  Patient will have a greater understanding of diabetes self-management. Patient education plan is to attend individual and/or group sessions per assessed needs and concerns.   Plan:   Patient Instructions  Check blood sugars 1 x day before breakfast or 2 hrs after supper every day Bring blood sugar records to the next class  Exercise:  Begin walking  for    10  minutes   3  days a week and gradually increase to 30 minutes 5 x week  Eat 3 meals day,   1  snack a day Space meals 4-6 hours apart Don't skip meals - eat 1 protein and 1 carbohydrate serving  Make an eye doctor appointment  Expected Outcomes:  Demonstrated interest in learning. Expect positive outcomes  Education material provided:  General Meal Planning Guidelines Simple Meal Plan  If problems or questions, patient to contact team via:   [name], RN, [place] [phone]  Future DSME appointment: 4-6  wks March 07, 2022 for Diabetes Class 1

## 2022-01-28 ENCOUNTER — Telehealth: Payer: Self-pay

## 2022-02-01 DIAGNOSIS — E1159 Type 2 diabetes mellitus with other circulatory complications: Secondary | ICD-10-CM

## 2022-02-01 DIAGNOSIS — I10 Essential (primary) hypertension: Secondary | ICD-10-CM

## 2022-02-01 DIAGNOSIS — Z7984 Long term (current) use of oral hypoglycemic drugs: Secondary | ICD-10-CM | POA: Diagnosis not present

## 2022-02-01 DIAGNOSIS — E785 Hyperlipidemia, unspecified: Secondary | ICD-10-CM | POA: Diagnosis not present

## 2022-02-04 ENCOUNTER — Other Ambulatory Visit: Payer: Self-pay | Admitting: Family Medicine

## 2022-02-07 ENCOUNTER — Other Ambulatory Visit: Payer: Self-pay | Admitting: Family Medicine

## 2022-02-07 DIAGNOSIS — R972 Elevated prostate specific antigen [PSA]: Secondary | ICD-10-CM

## 2022-02-09 ENCOUNTER — Other Ambulatory Visit: Payer: Self-pay | Admitting: Family Medicine

## 2022-02-11 ENCOUNTER — Telehealth: Payer: Medicare HMO

## 2022-02-12 ENCOUNTER — Telehealth: Payer: Medicare HMO

## 2022-02-15 ENCOUNTER — Telehealth: Payer: Self-pay

## 2022-02-15 ENCOUNTER — Other Ambulatory Visit: Payer: Self-pay | Admitting: Urology

## 2022-02-15 DIAGNOSIS — E1169 Type 2 diabetes mellitus with other specified complication: Secondary | ICD-10-CM

## 2022-02-15 MED ORDER — EMPAGLIFLOZIN 10 MG PO TABS: 10.0000 mg | ORAL_TABLET | Freq: Every day | ORAL | 0 refills | Status: DC

## 2022-02-15 NOTE — Chronic Care Management (AMB) (Signed)
    Chronic Care Management Pharmacy Assistant   Name: Seth Higginbotham  MRN: [id number] DOB: 07/20/1956   Reason for Encounter: Reminder Call   Conditions to be addressed/monitored: HTN, HLD, DMII, and CKD Stage 2   Medications: Outpatient Encounter Medications as of 02/15/2022  Medication Sig Note   allopurinol (ZYLOPRIM) 100 MG tablet Take 1 tablet (100 mg total) by mouth daily.    amLODipine (NORVASC) 10 MG tablet Take 1 tablet (10 mg total) by mouth daily.    blood glucose meter kit and supplies KIT Dispense based on patient and insurance preference. Use up to four times daily as directed. 01/16/2022: Not started yet - waiting on mail order delivery   celecoxib (CELEBREX) 200 MG capsule Take 1 capsule (200 mg total) by mouth daily. (Patient taking differently: Take 200 mg by mouth as needed for mild pain or moderate pain.)    cetirizine (ZYRTEC) 10 MG tablet TAKE 1 TABLET BY MOUTH EVERY DAY    empagliflozin (JARDIANCE) 10 MG TABS tablet Take 1 tablet (10 mg total) by mouth daily before breakfast. 01/16/2022: Not started yet - waiting on mail order delivery   Ferrous Sulfate (IRON) 325 (65 Fe) MG TABS Take 1 tablet (325 mg total) by mouth daily.    labetalol (NORMODYNE) 100 MG tablet TAKE 1/2 TABLET TWICE DAILY    metFORMIN (GLUCOPHAGE) 500 MG tablet Take 2 tablets (1,000 mg total) by mouth 2 (two) times daily with a meal. 01/16/2022: Take 2 tab AM, 1 tab PM currently, planning to increase to 2 tab BID in 1-2 weeks   omeprazole (PRILOSEC) 20 MG capsule Take 1 capsule (20 mg total) by mouth daily.    tamsulosin (FLOMAX) 0.4 MG CAPS capsule Take 1 capsule (0.4 mg total) by mouth daily.    TRUE METRIX BLOOD GLUCOSE TEST test strip USE  UP  TO FOUR TIMES DAILY AS DIRECTED    TRUEplus Lancets 33G MISC USE  UP  TO FOUR TIMES DAILY AS DIRECTED    No facility-administered encounter medications on file as of 02/15/2022.   Emarion Toral was contacted to remind of upcoming telephone visit with Charlene Brooke  on 02/20/22 at 9:30am. Patient was reminded to have any blood glucose and blood pressure readings available for review at appointment.   Patient confirmed appointment.   Are you having any problems with your medications? No   Do you have any concerns you like to discuss with the pharmacist? No  Patient given 30ds of Jardiance 55m . This is working well,am fasting BG's 101-140 . Requests refill through [facility] Phone:  [phone] Fax:  [phone]    CCM referral has been placed prior to visit?  Yes    Star Rating Drugs: Medication:  Last Fill: Day Supply Jardiance 149m6/12/23 30  Needs refill Metformin 50053m/4/23  90 [place] notified  [phone]

## 2022-02-15 NOTE — Addendum Note (Signed)
Addended by: Charlton Haws on: 02/15/2022 11:33 AM   Modules accepted: Orders

## 2022-02-15 NOTE — Telephone Encounter (Addendum)
Patient started Jardiance 10 mg ~30 days ago and is tolerating it well. He reports fasting BG 101-140, much improved. He needs refill sent to [facility]. He has PCP f/u 04/15/22. Will refill 90 day supply to get him to next appt.

## 2022-02-20 ENCOUNTER — Telehealth: Payer: Self-pay | Admitting: Pharmacist

## 2022-02-20 ENCOUNTER — Ambulatory Visit (INDEPENDENT_AMBULATORY_CARE_PROVIDER_SITE_OTHER): Payer: Medicare HMO | Admitting: Pharmacist

## 2022-02-20 DIAGNOSIS — E782 Mixed hyperlipidemia: Secondary | ICD-10-CM

## 2022-02-20 DIAGNOSIS — I10 Essential (primary) hypertension: Secondary | ICD-10-CM

## 2022-02-20 DIAGNOSIS — E1169 Type 2 diabetes mellitus with other specified complication: Secondary | ICD-10-CM

## 2022-02-20 MED ORDER — ROSUVASTATIN CALCIUM 5 MG PO TABS: 5.0000 mg | ORAL_TABLET | ORAL | 0 refills | Status: DC

## 2022-02-20 NOTE — Patient Instructions (Signed)
Visit Information  Phone number for Pharmacist: [phone]   Goals Addressed   None     Care Plan : [place] plan  Updates made by Charlton Haws, RPH since 02/20/2022 12:00 AM     Problem: Hypertension, Hyperlipidemia, Diabetes, GERD, and Gout   Priority: High     Long-Range Goal: Disease mgmt   Start Date: 01/18/2022  Expected End Date: 01/19/2023  This Visit's Progress: On track  Recent Progress: On track  Priority: High  Note:   Current Barriers:  Unable to maintain control of DM  Pharmacist Clinical Goal(s):  Patient will achieve improvement in DM as evidenced by BG/A1c through collaboration with PharmD and provider.   Interventions: 1:1 collaboration with Lesleigh Noe, MD regarding development and update of comprehensive plan of care as evidenced by provider attestation and co-signature Inter-disciplinary care team collaboration (see longitudinal plan of care) Comprehensive medication review performed; medication list updated in electronic medical record  Hypertension (BP goal <130/80) -Controlled - BP at goal in recent OV; pt affirms compliance as prescribed -Current treatment: Amlodipine 10 mg daily - Appropriate, Effective, Safe, Accessible Labetolol 100 mg - 1/2 tab BID - Appropriate, Effective, Safe, Accessible -Medications previously tried: lisinopril  -Educated on BP goals and benefits of medications for prevention of heart attack, stroke and kidney damage; -Counseled to monitor BP at home periodically -Recommended to continue current medication  Hyperlipidemia: (LDL goal < 100) -Not ideally controlled - LDL 119 (01/2022); pt has statin listed as an allergy due to myopathy, pt cannot recall which statin he tried and not available in chart review either; pt has discussed trial of once weekly statin with PCP -Current treatment: None -Medications previously tried: none  -Educated on Cholesterol goals; Benefits of statin for ASCVD risk  reduction; -Recommend rosuvastatin 5 mg weekly; pt to call if he develops side effects  Diabetes (A1c goal <7%) -Uncontrolled, improving - A1c 10.2% (01/2022), previously 6.2% (08/2019); pt reports improvement in BG since increasing metformin and starting Jardiance -Current home glucose readings:   BG: < 150 most of the time; 107, 102, 108; mostly 120-130 -Current medications: Metformin 500 mg - 2 tab BID - Appropriate, Query Effective Jardiance 10 mg daily -Appropriate, Query Effective Testing supplies -Medications previously tried: n/a  -Educated on A1c and blood sugar goals; Complications of diabetes including kidney damage, retinal damage, and cardiovascular disease; Exercise goal of 150 minutes per week; Benefits of routine self-monitoring of blood sugar; Carbohydrate counting and/or plate method -Recommend to continue current medication; f/u PCP 04/15/22  Gout (Goal: prevent flares) -Controlled - pt reports last flare over 2 years ago -Current treatment  Allopurinol 100 mg daily - Appropriate, Effective, Safe, Accessible -Medications previously tried: n/a  -Recommended to continue current medication  GERD (Goal: minimize symptoms of reflux ) -Controlled - pt denies issue with relfux, wonders if he still needs PPI -Hx of Bleeds/ulcers: No -Current treatment  Omeprazole 20 mg daily - Query Appropriate -Medications previously tried: none reported  -Recommend taper omeprazole: take every other day for 1-2 weeks, then stop and take PRN; can resume if symptoms return  Health Maintenance -Vaccine gaps: Shingrix.  Patient Goals/Self-Care Activities Patient will:  - take medications as prescribed as evidenced by patient report and record review focus on medication adherence by routine check glucose daily, document, and provide at future appointments target a minimum of 150 minutes of moderate intensity exercise weekly engage in dietary modifications by limit candy; use plate method  to plan meals  The patient verbalized understanding of instructions, educational materials, and care plan provided today and DECLINED offer to receive copy of patient instructions, educational materials, and care plan.  Telephone follow up appointment with pharmacy team member scheduled for: 3 months  Charlene Brooke, PharmD, [facility] Clinical Pharmacist [facility] [phone]

## 2022-02-20 NOTE — Progress Notes (Signed)
Chronic Care Management Pharmacy Note  02/20/2022 Name:  Jeremy Andrews MRN:  [id number] DOB:  [birth year]  Summary: CCM Initial visit -Reviewed medications; pt affirmed adherence -DM: A1c 10.2% (01/2022); pt reports improved fasting sugars since increasing metformin and starting Jardiance (avg 120-130s, range 103-150) -HLD: LDL 119 (01/2022); pt has statin listed as allergy but cannot recall which statin he tried; he has discussed trial of once weekly statin with PCP and agrees to start now  Recommendations/Changes made from today's visit: -Recommend rosuvastatin 5 mg weekly - see phone note -Advised to limit candy/fruit juice; discussed plate method for optimizing meal planning -Advised to start exercise - start with 5 min of walking per day, work up to 30 min 5 days a week  Plan: -[place] will call patient 1 month for statin tolerability -Pharmacist follow up televisit scheduled for 3 months -PCP F/U 04/15/22    Subjective: Jeremy Andrews is an 66 y.o. year old male who is a primary patient of Cody, Jobe Marker, MD.  The CCM team was consulted for assistance with disease management and care coordination needs.    Engaged with patient by telephone for follow up visit in response to provider referral for pharmacy case management and/or care coordination services.   Consent to Services:  The patient was given information about Chronic Care Management services, agreed to services, and gave verbal consent prior to initiation of services.  Please see initial visit note for detailed documentation.   Patient Care Team: Lesleigh Noe, MD as PCP - General (Family Medicine) Michiel Cowboy, MD as Referring Physician (Gastroenterology) Dannielle Karvonen, RN as Case Manager Charlton Haws, [facility] as Pharmacist (Pharmacist)  Recent office visits: 01/11/22 Dr Einar Pheasant OV: f/u DM. A1C 10.2%. Increase metformin to 1000 mg BID, Start Jardiance 10 mg. Check fasting BG. Address statin therapy 1  month.  01/08/22 Dr Einar Pheasant OV: CPE- referred to urology for PSA.  Recent consult visits: 01/23/22 Dr Bernardo Heater (Urology): elevated PSA - start tamsulosin 0.4 mg. Repeat PSA 1 month.  01/22/22 Nutrition - DM education  Hospital visits: None in previous 6 months   Objective:  Lab Results  Component Value Date   CREATININE 0.88 01/08/2022   BUN 8 01/08/2022   GFR 88.62 01/08/2022   GFRNONAA >60 05/04/2019   GFRAA >60 05/04/2019   NA 136 01/08/2022   K 4.5 01/08/2022   CALCIUM 9.7 01/08/2022   CO2 28 01/08/2022   GLUCOSE 226 (H) 01/08/2022    Lab Results  Component Value Date/Time   HGBA1C 10.2 (H) 01/08/2022 10:04 AM   HGBA1C 6.2 08/31/2019 11:04 AM   HGBA1C 5.6 09/21/2018 12:00 AM   GFR 88.62 01/08/2022 10:04 AM   GFR 117.13 08/31/2019 11:04 AM   MICROALBUR 166.3 (H) 01/08/2022 10:04 AM   MICROALBUR 39.4 (H) 08/31/2019 12:36 PM    Last diabetic Eye exam:  Lab Results  Component Value Date/Time   HMDIABEYEEXA Retinopathy (A) 06/26/2021 12:00 AM   HMDIABEYEEXA Retinopathy (A) 06/26/2021 12:00 AM    Last diabetic Foot exam: No results found for: "HMDIABFOOTEX"   Lab Results  Component Value Date   CHOL 184 01/08/2022   HDL 31.20 (L) 01/08/2022   LDLCALC 119 (H) 01/08/2022   TRIG 168.0 (H) 01/08/2022   CHOLHDL 6 01/08/2022       Latest Ref Rng & Units 01/08/2022   10:04 AM 08/31/2019   11:04 AM 09/21/2018   12:00 AM  Hepatic Function  Total Protein 6.0 - 8.3 g/dL 7.2  7.0    Albumin 3.5 - 5.2 g/dL 3.9  4.0    AST 0 - 37 U/L 45  37  123      ALT 0 - 53 U/L 36  28  57      Alk Phosphatase 39 - 117 U/L 61  62  100      Total Bilirubin 0.2 - 1.2 mg/dL 0.6  0.4       This result is from an external source.    No results found for: "TSH", "FREET4"     Latest Ref Rng & Units 01/08/2022   10:04 AM 11/01/2019    9:49 AM 08/31/2019   11:04 AM  CBC  WBC 4.0 - 10.5 K/uL 4.3  6.6  5.8   Hemoglobin 13.0 - 17.0 g/dL 12.7  12.5  10.8   Hematocrit 39.0 - 52.0 % 39.8  37.8   33.5   Platelets 150.0 - 400.0 K/uL 168.0  197.0  224.0     No results found for: "VD25OH"  Clinical ASCVD: No  The 10-year ASCVD risk score (Arnett DK, et al., 2019) is: 41%   Values used to calculate the score:     Age: 66 years     Sex: Male     Is Non-Hispanic African American: Yes     Diabetic: Yes     Tobacco smoker: No     Systolic Blood Pressure: 595 mmHg     Is BP treated: Yes     HDL Cholesterol: 31.2 mg/dL     Total Cholesterol: 184 mg/dL       01/22/2022    3:12 PM 11/01/2021   10:34 AM 02/16/2021    9:22 AM  Depression screen PHQ 2/9  Decreased Interest 0 0 0  Down, Depressed, Hopeless 0 0 0  PHQ - 2 Score 0 0 0     Social History   Tobacco Use  Smoking Status Former   Packs/day: 0.50   Years: 25.00   Total pack years: 12.50   Types: Cigarettes   Quit date: 1990   Years since quitting: 33.5  Smokeless Tobacco Never   BP Readings from Last 3 Encounters:  01/23/22 (!) 144/90  01/22/22 (!) 142/90  01/11/22 124/80   Pulse Readings from Last 3 Encounters:  01/23/22 98  01/11/22 87  01/08/22 (!) 106   Wt Readings from Last 3 Encounters:  01/23/22 246 lb (111.6 kg)  01/22/22 244 lb 14.4 oz (111.1 kg)  01/11/22 249 lb (112.9 kg)   BMI Readings from Last 3 Encounters:  01/23/22 34.31 kg/m  01/22/22 34.16 kg/m  01/11/22 34.97 kg/m    Assessment/Interventions: Review of patient past medical history, allergies, medications, health status, including review of consultants reports, laboratory and other test data, was performed as part of comprehensive evaluation and provision of chronic care management services.   SDOH:  (Social Determinants of Health) assessments and interventions performed: No - done March 2023  SDOH Screenings   Alcohol Screen: Low Risk  (11/01/2021)   Alcohol Screen    Last Alcohol Screening Score (AUDIT): 0  Depression (PHQ2-9): Low Risk  (01/22/2022)   Depression (PHQ2-9)    PHQ-2 Score: 0  Financial Resource Strain: Low  Risk  (11/01/2021)   Overall Financial Resource Strain (CARDIA)    Difficulty of Paying Living Expenses: Not hard at all  Food Insecurity: No Food Insecurity (11/01/2021)   Hunger Vital Sign    Worried About Running Out of Food in the Last Year: Never  true    Ran Out of Food in the Last Year: Never true  Housing: Low Risk  (11/01/2021)   Housing    Last Housing Risk Score: 0  Physical Activity: Sufficiently Active (11/01/2021)   Exercise Vital Sign    Days of Exercise per Week: 5 days    Minutes of Exercise per Session: 30 min  Social Connections: Moderately Integrated (11/01/2021)   Social Connection and Isolation Panel [NHANES]    Frequency of Communication with Friends and Family: More than three times a week    Frequency of Social Gatherings with Friends and Family: Once a week    Attends Religious Services: 1 to 4 times per year    Active Member of Clubs or Organizations: No    Attends Archivist Meetings: 1 to 4 times per year    Marital Status: Separated  Stress: No Stress Concern Present (10/31/2020)   [place] of Stress : Not at all  Tobacco Use: Medium Risk (01/23/2022)   Patient History    Smoking Tobacco Use: Former    Smokeless Tobacco Use: Never    Passive Exposure: Not on file  Transportation Needs: No Transportation Needs (11/01/2021)   PRAPARE - Hydrologist (Medical): No    Lack of Transportation (Non-Medical): No    CCM Care Plan  Allergies  Allergen Reactions   Lisinopril Swelling   Statins     Severe myopathy, in wheel chair Severe myopathy, in wheel chair    Medications Reviewed Today     Reviewed by Kyra Manges, CMA (Certified Medical Assistant) on 01/23/22 at 1437  Med List Status: <None>   Medication Order Taking? Sig Documenting Provider Last Dose Status Informant  allopurinol (ZYLOPRIM) 100 MG tablet [id number] Yes Take 1  tablet (100 mg total) by mouth daily. Lesleigh Noe, MD Taking Active   amLODipine (NORVASC) 10 MG tablet [id number] Yes Take 1 tablet (10 mg total) by mouth daily. Lesleigh Noe, MD Taking Active   blood glucose meter kit and supplies KIT [id number] Yes Dispense based on patient and insurance preference. Use up to four times daily as directed. Lesleigh Noe, MD Taking Active            Med Note Malena Catholic Jan 16, 2022 10:41 AM) Not started yet - waiting on mail order delivery  celecoxib (CELEBREX) 200 MG capsule [id number] Yes Take 1 capsule (200 mg total) by mouth daily.  Patient taking differently: Take 200 mg by mouth as needed for mild pain or moderate pain.   Lesleigh Noe, MD Taking Active            Med Note Loreen Freud   Tue Jan 08, 2022  9:21 AM)    cetirizine (ZYRTEC) 10 MG tablet [id number] Yes Take 1 tablet (10 mg total) by mouth daily. Lesleigh Noe, MD Taking Active   empagliflozin (JARDIANCE) 10 MG TABS tablet [id number] Yes Take 1 tablet (10 mg total) by mouth daily before breakfast. Lesleigh Noe, MD Taking Active            Med Note Malena Catholic Jan 16, 2022 10:41 AM) Not started yet - waiting on mail order delivery  Ferrous Sulfate (IRON) 325 (65 Fe) MG TABS [id number] Yes Take 1 tablet (325 mg total) by mouth daily. Lesleigh Noe, MD Taking Active  labetalol (NORMODYNE) 100 MG tablet [id number] Yes TAKE 1/2 TABLET TWICE DAILY Lesleigh Noe, MD Taking Active   metFORMIN (GLUCOPHAGE) 500 MG tablet [id number] Yes Take 2 tablets (1,000 mg total) by mouth 2 (two) times daily with a meal. Lesleigh Noe, MD Taking Active            Med Note Charlton Haws   Wed Jan 16, 2022 10:42 AM) Take 2 tab AM, 1 tab PM currently, planning to increase to 2 tab BID in 1-2 weeks  omeprazole (PRILOSEC) 20 MG capsule [id number] Yes Take 1 capsule (20 mg total) by mouth daily. 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[place]) 01/11/2022   CKD stage 2 due to type 2 diabetes mellitus ([place]) 01/11/2022   Alcohol use 01/08/2022   Type 2 diabetes mellitus with other specified complication ([place]) 71/01/2693   Myalgia due to statin 05/03/2020   Microcytic anemia 11/01/2019   Dyspnea on exertion 08/31/2019   Pain due to onychomycosis of toenails of both feet 02/18/2019   Essential hypertension 02/09/2019   Prediabetes 02/09/2019   Hyperlipidemia 02/09/2019   GERD (gastroesophageal reflux disease) 02/09/2019   Gout 02/09/2019   Environmental allergies 02/09/2019    Immunization History  Administered Date(s) Administered   PFIZER Comirnaty(Gray Top)Covid-19 Tri-Sucrose Vaccine 09/26/2020   PFIZER(Purple Top)SARS-COV-2 Vaccination 09/28/2019, 10/19/2019   Pneumococcal Conjugate-13 03/07/2017   Pneumococcal Polysaccharide-23 08/31/2019   Tdap 03/07/2017    Conditions to be addressed/monitored:  Hypertension, Hyperlipidemia, Diabetes, GERD, and Gout  Care Plan : [place] plan  Updates made by Charlton Haws, [place] since 02/20/2022 12:00 AM     Problem: Hypertension, Hyperlipidemia, Diabetes, GERD, and Gout   Priority: High     Long-Range Goal: Disease mgmt   Start Date: 01/18/2022  Expected End Date: 01/19/2023  This Visit's Progress: On track  Recent Progress: On track  Priority: High  Note:   Current Barriers:  Unable to maintain control of DM  Pharmacist Clinical Goal(s):  Patient will achieve improvement in DM as evidenced by BG/A1c through collaboration with PharmD and provider.   Interventions: 1:1 collaboration with Lesleigh Noe, MD regarding development and update of comprehensive plan of care as evidenced by provider attestation and co-signature Inter-disciplinary care team collaboration (see longitudinal plan of care) Comprehensive medication review performed; medication list updated in electronic  medical record  Hypertension (BP goal <130/80) -Controlled - BP at goal in recent OV; pt affirms compliance as prescribed -Current treatment: Amlodipine 10 mg daily - Appropriate, Effective, Safe, Accessible Labetolol 100 mg - 1/2 tab BID - Appropriate, Effective, Safe, Accessible -Medications previously tried: lisinopril  -Educated on BP goals and benefits of medications for prevention of heart attack, stroke and kidney damage; -Counseled to monitor BP at home periodically -Recommended to continue current medication  Hyperlipidemia: (LDL goal < 100) -Not ideally controlled - LDL 119 (01/2022); pt has statin listed as an allergy due to myopathy, pt cannot recall which statin he tried and not available in chart review either; pt has discussed trial of once weekly statin with PCP -Current treatment: None -Medications previously tried: none  -Educated on Cholesterol goals; Benefits of statin for ASCVD risk reduction; -Recommend rosuvastatin 5 mg weekly; pt to call if he develops side effects  Diabetes (A1c goal <7%) -Uncontrolled, improving - A1c 10.2% (01/2022), previously 6.2% (  08/2019); pt reports improvement in BG since increasing metformin and starting Jardiance -Current home glucose readings:   BG: < 150 most of the time; 107, 102, 108; mostly 120-130 -Current medications: Metformin 500 mg - 2 tab BID - Appropriate, Query Effective Jardiance 10 mg daily -Appropriate, Query Effective Testing supplies -Medications previously tried: n/a  -Educated on A1c and blood sugar goals; Complications of diabetes including kidney damage, retinal damage, and cardiovascular disease; Exercise goal of 150 minutes per week; Benefits of routine self-monitoring of blood sugar; Carbohydrate counting and/or plate method -Recommend to continue current medication; f/u PCP 04/15/22  Gout (Goal: prevent flares) -Controlled - pt reports last flare over 2 years ago -Current treatment  Allopurinol 100 mg daily -  Appropriate, Effective, Safe, Accessible -Medications previously tried: n/a  -Recommended to continue current medication  GERD (Goal: minimize symptoms of reflux ) -Controlled - pt denies issue with relfux, wonders if he still needs PPI -Hx of Bleeds/ulcers: No -Current treatment  Omeprazole 20 mg daily - Query Appropriate -Medications previously tried: none reported  -Recommend taper omeprazole: take every other day for 1-2 weeks, then stop and take PRN; can resume if symptoms return  Health Maintenance -Vaccine gaps: Shingrix.  Patient Goals/Self-Care Activities Patient will:  - take medications as prescribed as evidenced by patient report and record review focus on medication adherence by routine check glucose daily, document, and provide at future appointments target a minimum of 150 minutes of moderate intensity exercise weekly engage in dietary modifications by limit candy; use plate method to plan meals       Medication Assistance: None required.  Patient affirms current coverage meets needs.  Compliance/Adherence/Medication fill history: Care Gaps: Colonoscopy  Star-Rating Drugs: Metformin - PDC 27%; LF 01/02/22 x 90 ds, 05/09/21 x 90 ds Jardiance - PDC 33%  Medication Access: Within the past 30 days, how often has patient missed a dose of medication? 0 Is a pillbox or other method used to improve adherence? No  Factors that may affect medication adherence? no barriers identified Are meds synced by current pharmacy? No  Are meds delivered by current pharmacy? Yes  Does patient experience delays in picking up medications due to transportation concerns? No   [facility] Reviewed: Is patient disadvantaged to use [facility]?: Yes  Current Rx insurance plan: [employer] [place] Name and location of Current pharmacy:  [id number] [place] [id number]: [phone]: [phone] [place] [place]Phone: [phone]: [phone] [facility] services reviewed with patient today?: No  Patient requests to transfer care to [facility]?: No  Reason patient declined to change pharmacies: Disadvantaged due to insurance/mail order   Care Plan and Follow Up Patient Decision:  Patient agrees to Care Plan and Follow-up.  Plan: Telephone follow up appointment with care management team member scheduled for:  3 months  LCharlene Brooke PharmD, BCACP Clinical Pharmacist LSodus PointPrimary Care at SGold[facility] [phone]

## 2022-02-20 NOTE — Telephone Encounter (Signed)
Appreciate pharmacy support.  Medication sent to pharmacy

## 2022-02-20 NOTE — Telephone Encounter (Signed)
Discussed statin therapy with patient. He has "statins" listed on allergy list as causing myopathy, pt could not remember which statin he was on and it is not listed in chart review either.  Given recent worsening DM it is recommended for pt to be on a statin. He has discussed this with PCP at 01/11/22 appt and plan was to trial once weekly statin in 1 month.  Pt agrees to trial of rosuvastatin 5 mg weekly - routing to PCP for approval.

## 2022-02-25 ENCOUNTER — Other Ambulatory Visit: Payer: Medicare HMO

## 2022-02-25 DIAGNOSIS — R972 Elevated prostate specific antigen [PSA]: Secondary | ICD-10-CM

## 2022-02-26 ENCOUNTER — Telehealth: Payer: Self-pay | Admitting: Urology

## 2022-02-26 DIAGNOSIS — R972 Elevated prostate specific antigen [PSA]: Secondary | ICD-10-CM

## 2022-02-26 LAB — PSA: Prostate Specific Ag, Serum: 13.1 ng/mL — ABNORMAL HIGH (ref 0.0–4.0)

## 2022-02-26 NOTE — Telephone Encounter (Signed)
Notified patient as instructed,.  

## 2022-02-26 NOTE — Telephone Encounter (Signed)
Repeat PSA has increased to 13.1.  We discussed in previous office visit to proceed with a prostate MRI if PSA was persistently elevated.  I have entered the MRI order and he will be notified with the results.  Please let me know if he has any questions.

## 2022-03-04 DIAGNOSIS — E1169 Type 2 diabetes mellitus with other specified complication: Secondary | ICD-10-CM

## 2022-03-04 DIAGNOSIS — I10 Essential (primary) hypertension: Secondary | ICD-10-CM | POA: Diagnosis not present

## 2022-03-04 DIAGNOSIS — E782 Mixed hyperlipidemia: Secondary | ICD-10-CM | POA: Diagnosis not present

## 2022-03-07 ENCOUNTER — Encounter: Payer: Medicare HMO | Attending: Family Medicine | Admitting: *Deleted

## 2022-03-07 ENCOUNTER — Encounter: Payer: Self-pay | Admitting: *Deleted

## 2022-03-07 VITALS — Wt 249.5 lb

## 2022-03-07 DIAGNOSIS — E1165 Type 2 diabetes mellitus with hyperglycemia: Secondary | ICD-10-CM | POA: Insufficient documentation

## 2022-03-07 NOTE — Progress Notes (Signed)

## 2022-03-09 ENCOUNTER — Other Ambulatory Visit: Payer: Self-pay | Admitting: Urology

## 2022-03-14 ENCOUNTER — Encounter: Payer: Self-pay | Admitting: *Deleted

## 2022-03-14 ENCOUNTER — Encounter: Payer: Medicare HMO | Admitting: *Deleted

## 2022-03-14 VITALS — Wt 251.4 lb

## 2022-03-14 DIAGNOSIS — E1165 Type 2 diabetes mellitus with hyperglycemia: Secondary | ICD-10-CM | POA: Diagnosis not present

## 2022-03-14 NOTE — Progress Notes (Signed)

## 2022-03-20 ENCOUNTER — Ambulatory Visit
Admission: RE | Admit: 2022-03-20 | Discharge: 2022-03-20 | Disposition: A | Discharge status: Home / Self Care | Payer: Medicare HMO | Source: Ambulatory Visit | Attending: Urology | Admitting: Urology

## 2022-03-20 DIAGNOSIS — R590 Localized enlarged lymph nodes: Secondary | ICD-10-CM | POA: Diagnosis not present

## 2022-03-20 DIAGNOSIS — R972 Elevated prostate specific antigen [PSA]: Secondary | ICD-10-CM | POA: Insufficient documentation

## 2022-03-20 MED ORDER — GADOBUTROL 1 MMOL/ML IV SOLN
10.0000 mL | Freq: Once | INTRAVENOUS | Status: AC | PRN
  Administered 2022-03-20: 10 mL via INTRAVENOUS

## 2022-03-21 ENCOUNTER — Encounter: Payer: Medicare HMO | Admitting: *Deleted

## 2022-03-21 ENCOUNTER — Encounter: Payer: Self-pay | Admitting: *Deleted

## 2022-03-21 VITALS — BP 140/80 | Wt 249.4 lb

## 2022-03-21 DIAGNOSIS — E1165 Type 2 diabetes mellitus with hyperglycemia: Secondary | ICD-10-CM | POA: Diagnosis not present

## 2022-03-21 NOTE — Progress Notes (Signed)

## 2022-03-22 ENCOUNTER — Encounter: Payer: Self-pay | Admitting: *Deleted

## 2022-03-28 ENCOUNTER — Telehealth: Payer: Self-pay | Admitting: Urology

## 2022-03-28 DIAGNOSIS — R972 Elevated prostate specific antigen [PSA]: Secondary | ICD-10-CM

## 2022-03-28 NOTE — Telephone Encounter (Signed)
I contacted Jeremy Andrews this afternoon to discuss his prostate MRI results.  He did have a PI-RADS 5 lesion in the right peripheral zone.  We discussed this finding is suspicious for high-grade prostate cancer and I recommended scheduling an MR fusion biopsy at [facility] in [place].  He would like to proceed with biopsy and referral was placed.

## 2022-04-03 ENCOUNTER — Other Ambulatory Visit: Payer: Self-pay

## 2022-04-03 MED ORDER — CELECOXIB 200 MG PO CAPS: 200.0000 mg | ORAL_CAPSULE | ORAL | 0 refills | Status: DC | PRN

## 2022-04-12 ENCOUNTER — Other Ambulatory Visit: Payer: Self-pay

## 2022-04-12 MED ORDER — BD SWAB SINGLE USE REGULAR PADS: MEDICATED_PAD | 2 refills | Status: DC

## 2022-04-15 ENCOUNTER — Ambulatory Visit (INDEPENDENT_AMBULATORY_CARE_PROVIDER_SITE_OTHER): Payer: Medicare HMO | Admitting: Family Medicine

## 2022-04-15 VITALS — BP 130/82 | HR 104 | Temp 98.0°F | Wt 244.0 lb

## 2022-04-15 DIAGNOSIS — E1169 Type 2 diabetes mellitus with other specified complication: Secondary | ICD-10-CM | POA: Diagnosis not present

## 2022-04-15 DIAGNOSIS — T466X5A Adverse effect of antihyperlipidemic and antiarteriosclerotic drugs, initial encounter: Secondary | ICD-10-CM

## 2022-04-15 DIAGNOSIS — M791 Myalgia, unspecified site: Secondary | ICD-10-CM | POA: Diagnosis not present

## 2022-04-15 DIAGNOSIS — R972 Elevated prostate specific antigen [PSA]: Secondary | ICD-10-CM | POA: Insufficient documentation

## 2022-04-15 DIAGNOSIS — I10 Essential (primary) hypertension: Secondary | ICD-10-CM

## 2022-04-15 DIAGNOSIS — E785 Hyperlipidemia, unspecified: Secondary | ICD-10-CM

## 2022-04-15 LAB — POCT GLYCOSYLATED HEMOGLOBIN (HGB A1C): Hemoglobin A1C: 5.9 % — AB (ref 4.0–5.6)

## 2022-04-15 NOTE — Assessment & Plan Note (Signed)
Following with urology. Reviewed MRI PI-RADS category 5 with plan for biopsy. Appreciate urology support.

## 2022-04-15 NOTE — Assessment & Plan Note (Signed)
Tolerating once weekly crestor

## 2022-04-15 NOTE — Assessment & Plan Note (Signed)
Lab Results  Component Value Date   LDLCALC 119 (H) 01/08/2022   Tolerating crestor 5 mg weekly. Discussed increasing to twice per week but would like to wait until biopsy/psa evaluation complete.

## 2022-04-15 NOTE — Progress Notes (Signed)
Subjective:     Jeremy Andrews is a 66 y.o. male presenting for Follow-up (3 mo DM )     HPI  #PSA elevated  - psa elevated - MRI concerning for possible cancer - is anticipating biopsy   #Diabetes Currently taking metformin 1000 mg bid, jardiance 10 mg  Using medications without difficulties: Yes Hypoglycemic episodes:No  Hyperglycemic episodes:No  Feet problems:No  Blood Sugars averaging: 110-130 Last HgbA1c:  Lab Results  Component Value Date   HGBA1C 5.9 (A) 04/15/2022    Diabetes Health Maintenance Due:    Diabetes Health Maintenance Due  Topic Date Due   OPHTHALMOLOGY EXAM  06/26/2022   FOOT EXAM  07/12/2022   HEMOGLOBIN A1C  10/14/2022   URINE MICROALBUMIN  01/09/2023    01/11/2022: Clinic - DM poorly controlled - increase metformin 1000 mg bid, start jardiance 10 mg. Nutrition c/s. pharmacy  02/15/2022: Phone call - pharmacy - started jardiance with fasting bg 101-140 Start rosuvastatin 5 mg for cholesterol   Review of Systems   Social History   Tobacco Use  Smoking Status Former   Packs/day: 0.50   Years: 25.00   Total pack years: 12.50   Types: Cigarettes   Quit date: 1990   Years since quitting: 33.7  Smokeless Tobacco Never        Objective:    BP Readings from Last 3 Encounters:  04/15/22 130/82  03/21/22 (!) 140/80  01/23/22 (!) 144/90   Wt Readings from Last 3 Encounters:  04/15/22 244 lb (110.7 kg)  03/21/22 249 lb 6.4 oz (113.1 kg)  03/14/22 251 lb 6.4 oz (114 kg)    BP 130/82   Pulse (!) 104   Temp 98 F (36.7 C) (Temporal)   Wt 244 lb (110.7 kg)   SpO2 97%   BMI 34.03 kg/m    Physical Exam Constitutional:      Appearance: Normal appearance. He is not ill-appearing or diaphoretic.  HENT:     Right Ear: External ear normal.     Left Ear: External ear normal.     Nose: Nose normal.  Eyes:     General: No scleral icterus.    Extraocular Movements: Extraocular movements intact.     Conjunctiva/sclera:  Conjunctivae normal.  Cardiovascular:     Rate and Rhythm: Normal rate and regular rhythm.  Pulmonary:     Effort: Pulmonary effort is normal. No respiratory distress.     Breath sounds: Normal breath sounds. No wheezing.  Musculoskeletal:     Cervical back: Neck supple.  Skin:    General: Skin is warm and dry.  Neurological:     Mental Status: He is alert. Mental status is at baseline.  Psychiatric:        Mood and Affect: Mood normal.           Assessment & Plan:   Problem List Items Addressed This Visit       Cardiovascular and Mediastinum   Essential hypertension    Cont amlodipine 10 mg and labetalol 100 mg. Angioedema with lisinopril         Endocrine   Type 2 diabetes mellitus with other specified complication ([place]) - Primary    Lab Results  Component Value Date   HGBA1C 5.9 (A) 04/15/2022  Controlled. Cont metformin 1000 mg bid and jardiance 10 mg. Return 3 months and consider decreasing meds if remaining <6. Appreciate pharmacy and nutrition support.       Relevant Orders   POCT glycosylated hemoglobin (  Hb A1C) (Completed)     Other   Hyperlipidemia    Lab Results  Component Value Date   LDLCALC 119 (H) 01/08/2022  Tolerating crestor 5 mg weekly. Discussed increasing to twice per week but would like to wait until biopsy/psa evaluation complete.       Myalgia due to statin    Tolerating once weekly crestor      Elevated PSA    Following with urology. Reviewed MRI PI-RADS category 5 with plan for biopsy. Appreciate urology support.         Return in about 3 months (around 07/15/2022) for [facility] Tabitha Dugal.  Lesleigh Noe, MD

## 2022-04-15 NOTE — Assessment & Plan Note (Signed)
Lab Results  Component Value Date   HGBA1C 5.9 (A) 04/15/2022   Controlled. Cont metformin 1000 mg bid and jardiance 10 mg. Return 3 months and consider decreasing meds if remaining <6. Appreciate pharmacy and nutrition support.

## 2022-04-15 NOTE — Patient Instructions (Addendum)
You are due for a follow-up Colonoscopy - Call Dr. Barkley Bruns, Veronda Prude, MD   [place]   [address] [place], [place] [address]   [phone]   [phone] (Fax)     Diabetes - update if Fasting is starting to be less than 100 regularly or if low  - Continue both medications - see Tabitha in 3 months

## 2022-04-15 NOTE — Assessment & Plan Note (Signed)
Cont amlodipine 10 mg and labetalol 100 mg. Angioedema with lisinopril

## 2022-04-24 ENCOUNTER — Other Ambulatory Visit: Payer: Self-pay | Admitting: Family Medicine

## 2022-04-25 ENCOUNTER — Encounter: Payer: Self-pay | Admitting: Podiatry

## 2022-04-25 ENCOUNTER — Ambulatory Visit (INDEPENDENT_AMBULATORY_CARE_PROVIDER_SITE_OTHER): Payer: Medicare HMO | Admitting: Podiatry

## 2022-04-25 DIAGNOSIS — M79674 Pain in right toe(s): Secondary | ICD-10-CM

## 2022-04-25 DIAGNOSIS — E119 Type 2 diabetes mellitus without complications: Secondary | ICD-10-CM | POA: Diagnosis not present

## 2022-04-25 DIAGNOSIS — B351 Tinea unguium: Secondary | ICD-10-CM | POA: Diagnosis not present

## 2022-04-25 DIAGNOSIS — M79675 Pain in left toe(s): Secondary | ICD-10-CM

## 2022-04-25 DIAGNOSIS — N182 Chronic kidney disease, stage 2 (mild): Secondary | ICD-10-CM

## 2022-04-25 DIAGNOSIS — E1122 Type 2 diabetes mellitus with diabetic chronic kidney disease: Secondary | ICD-10-CM

## 2022-04-25 NOTE — Progress Notes (Signed)
This patient returns to my office for at risk foot care.  This patient requires this care by a professional since this patient will be at risk due to having diabetes according to patient.  This patient is unable to cut nails himself since the patient cannot reach his nails.These nails are painful walking and wearing shoes.  This patient presents for at risk foot care today.  General Appearance  Alert, conversant and in no acute stress.  Vascular  Dorsalis pedis and posterior tibial  pulses are weakly  palpable  bilaterally.  Capillary return is within normal limits  bilaterally. Temperature is within normal limits  bilaterally.  Neurologic  Senn-Weinstein monofilament wire test within normal limits  bilaterally. Muscle power within normal limits bilaterally.  Nails Thick disfigured discolored nails with subungual debris  from hallux to fifth toes bilaterally. No evidence of bacterial infection or drainage bilaterally.  Orthopedic  No limitations of motion  feet .  No crepitus or effusions noted.  No bony pathology or digital deformities noted.  Skin  normotropic skin with no porokeratosis noted bilaterally.  No signs of infections or ulcers noted.     Onychomycosis  Pain in right toes  Pain in left toes  Consent was obtained for treatment procedures.   Mechanical debridement of nails 1-5  bilaterally performed with a nail nipper.  Filed with dremel without incident.    Return office visit    3 months                  Told patient to return for periodic foot care and evaluation due to potential at risk complications.   Lakiesha Ralphs DPM  

## 2022-04-30 DIAGNOSIS — N411 Chronic prostatitis: Secondary | ICD-10-CM | POA: Diagnosis not present

## 2022-04-30 DIAGNOSIS — C61 Malignant neoplasm of prostate: Secondary | ICD-10-CM | POA: Diagnosis not present

## 2022-04-30 DIAGNOSIS — N410 Acute prostatitis: Secondary | ICD-10-CM | POA: Diagnosis not present

## 2022-05-03 ENCOUNTER — Encounter: Payer: Self-pay | Admitting: Urology

## 2022-05-03 ENCOUNTER — Other Ambulatory Visit: Payer: Self-pay | Admitting: Family Medicine

## 2022-05-03 DIAGNOSIS — E782 Mixed hyperlipidemia: Secondary | ICD-10-CM

## 2022-05-03 DIAGNOSIS — E1169 Type 2 diabetes mellitus with other specified complication: Secondary | ICD-10-CM

## 2022-05-09 ENCOUNTER — Ambulatory Visit: Payer: Medicare HMO | Admitting: Urology

## 2022-05-09 ENCOUNTER — Encounter: Payer: Self-pay | Admitting: Urology

## 2022-05-09 VITALS — BP 130/71 | HR 112 | Ht 71.0 in | Wt 242.0 lb

## 2022-05-09 DIAGNOSIS — C61 Malignant neoplasm of prostate: Secondary | ICD-10-CM | POA: Diagnosis not present

## 2022-05-09 NOTE — Progress Notes (Signed)
05/09/2022 1:15 PM   Gaynelle Arabian 19-Jul-1956 [id number]  Referring provider: Lesleigh Noe, MD [place],  [place] [id number]  Chief Complaint  Patient presents with   Follow-up    HPI: 66 y.o. male presents for prostate biopsy follow-up.  He had no postbiopsy complaints.  He presents today with his wife.  Fusion biopsy [facility] 04/30/2022; PSA 13.1; MRI PI-RADS 5 lesion right [facility] biopsy complaints; prostate volume 40 g ROI biopsy X 4 plus standard 12 core template biopsy 3/4 ROI cores positive Gleason 3+4 adenocarcinoma (70%, 50%, 40%); 4/6 right-sided cores (base, mid, lateral mid with Gleason 3+4 adenocarcinoma-5%, 60% 50%) and lateral base Gleason 4+3 adenocarcinoma-50%.  All left-sided cores negative   PMH: Past Medical History:  Diagnosis Date   Allergy    Colon polyps    Diabetes mellitus without complication ([place])    Gout    Hyperlipidemia    Hypertension     Surgical History: Past Surgical History:  Procedure Laterality Date   COLONOSCOPY W/ POLYPECTOMY N/A    NO PAST SURGERIES      Home Medications:  Allergies as of 05/09/2022       Reactions   Lisinopril Swelling   Statins    Severe myopathy, in wheel chair Severe myopathy, in wheel chair        Medication List        Accurate as of May 09, 2022  1:15 PM. If you have any questions, ask your nurse or doctor.          allopurinol 100 MG tablet Commonly known as: ZYLOPRIM Take 1 tablet (100 mg total) by mouth daily.   amLODipine 10 MG tablet Commonly known as: NORVASC Take 1 tablet (10 mg total) by mouth daily.   B-D SINGLE USE SWABS REGULAR Pads USE UP TO FOUR TIMES DAILY AS DIRECTED   blood glucose meter kit and supplies Kit Dispense based on patient and insurance preference. Use up to four times daily as directed.   celecoxib 200 MG capsule Commonly known as: CELEBREX Take 1 capsule (200 mg total) by mouth as needed for mild pain or moderate pain.    cetirizine 10 MG tablet Commonly known as: ZYRTEC TAKE 1 TABLET BY MOUTH EVERY DAY   Iron 325 (65 Fe) MG Tabs Take 1 tablet (325 mg total) by mouth daily.   Jardiance 10 MG Tabs tablet Generic drug: empagliflozin TAKE 1 TABLET EVERY DAY BEFORE BREAKFAST   labetalol 100 MG tablet Commonly known as: NORMODYNE TAKE 1/2 TABLET TWICE DAILY   levofloxacin 750 MG tablet Commonly known as: LEVAQUIN Take 750 mg by mouth daily.   metFORMIN 500 MG tablet Commonly known as: GLUCOPHAGE Take 2 tablets (1,000 mg total) by mouth 2 (two) times daily with a meal.   omeprazole 20 MG capsule Commonly known as: PRILOSEC Take 1 capsule (20 mg total) by mouth daily.   rosuvastatin 5 MG tablet Commonly known as: CRESTOR TAKE 1 TABLET EVERY WEEK   tamsulosin 0.4 MG Caps capsule Commonly known as: FLOMAX TAKE 1 CAPSULE BY MOUTH EVERY DAY   True Metrix Blood Glucose Test test strip Generic drug: glucose blood USE  UP  TO FOUR TIMES DAILY AS DIRECTED   TRUEplus Lancets 33G Misc USE  UP  TO FOUR TIMES DAILY AS DIRECTED        Allergies:  Allergies  Allergen Reactions   Lisinopril Swelling   Statins     Severe myopathy, in wheel chair  Severe myopathy, in wheel chair    Family History: Family History  Problem Relation Age of Onset   Diabetes Mother    Hypertension Mother    Gout Mother    Other Mother        meningioma   Hypertension Father    Kidney disease Sister    Hypertension Sister    Kidney failure Sister    Other Sister        legally blind   Diabetes Sister    Breast cancer Sister    Diabetes Brother    Arthritis Brother    Gout Brother    Breast cancer Maternal Grandmother 60    Social History:  reports that he quit smoking about 33 years ago. His smoking use included cigarettes. He has a 12.50 pack-year smoking history. He has never used smokeless tobacco. He reports current alcohol use of about 2.0 - 3.0 standard drinks of alcohol per week. He reports that  he does not currently use drugs after having used the following drugs: Marijuana.   Physical Exam: BP 130/71   Pulse (!) 112   Ht _0  (1.803 m)   Wt 242 lb (109.8 kg)   BMI 33.75 kg/m   Constitutional:  Alert and oriented, No acute distress. HEENT: [place] AT Respiratory: Normal respiratory effort, no increased work of breathing. Psychiatric: Normal mood and affect.     Assessment & Plan:    1.  Prostate cancer Clinical stage T1c N0 Mx NCCN risk stratification: Intermediate unfavorable Would not recommend active surveillance for intermediate unfavorable disease.  The 2 most common curative treatment options of RALP and radiation modalities were discussed.  The pros and cons of each treatment were reviewed and he is more interested in radiation therapy Radiation oncology referral entered  Bone imaging recommended-bone scan versus PSMA/PET.  Will discuss with radiation oncology    Abbie Sons, [place] [address], [place], [place] [address] [phone]

## 2022-05-12 ENCOUNTER — Other Ambulatory Visit: Payer: Self-pay | Admitting: Family Medicine

## 2022-05-20 ENCOUNTER — Telehealth: Payer: Self-pay

## 2022-05-20 NOTE — Chronic Care Management (AMB) (Signed)
    Chronic Care Management Pharmacy Assistant   Name: Danuel Felicetti  MRN: [id number] DOB: 06-10-1956  Reason for Encounter: Reminder Call  Medications: Outpatient Encounter Medications as of 05/20/2022  Medication Sig Note   Alcohol Swabs (B-D SINGLE USE SWABS REGULAR) PADS USE UP TO FOUR TIMES DAILY AS DIRECTED    allopurinol (ZYLOPRIM) 100 MG tablet Take 1 tablet (100 mg total) by mouth daily.    amLODipine (NORVASC) 10 MG tablet Take 1 tablet (10 mg total) by mouth daily.    blood glucose meter kit and supplies KIT Dispense based on patient and insurance preference. Use up to four times daily as directed. 01/16/2022: Not started yet - waiting on mail order delivery   celecoxib (CELEBREX) 200 MG capsule TAKE 1 CAPSULE (200 MG TOTAL) BY MOUTH AS NEEDED FOR MILD PAIN OR MODERATE PAIN    cetirizine (ZYRTEC) 10 MG tablet TAKE 1 TABLET BY MOUTH EVERY DAY    Ferrous Sulfate (IRON) 325 (65 Fe) MG TABS Take 1 tablet (325 mg total) by mouth daily.    JARDIANCE 10 MG TABS tablet TAKE 1 TABLET EVERY DAY BEFORE BREAKFAST    labetalol (NORMODYNE) 100 MG tablet TAKE 1/2 TABLET TWICE DAILY    levofloxacin (LEVAQUIN) 750 MG tablet Take 750 mg by mouth daily.    metFORMIN (GLUCOPHAGE) 500 MG tablet Take 2 tablets (1,000 mg total) by mouth 2 (two) times daily with a meal. 01/16/2022: Take 2 tab AM, 1 tab PM currently, planning to increase to 2 tab BID in 1-2 weeks   omeprazole (PRILOSEC) 20 MG capsule Take 1 capsule (20 mg total) by mouth daily.    rosuvastatin (CRESTOR) 5 MG tablet TAKE 1 TABLET EVERY WEEK    tamsulosin (FLOMAX) 0.4 MG CAPS capsule TAKE 1 CAPSULE BY MOUTH EVERY DAY    TRUE METRIX BLOOD GLUCOSE TEST test strip USE  UP  TO FOUR TIMES DAILY AS DIRECTED    TRUEplus Lancets 33G MISC USE  UP  TO FOUR TIMES DAILY AS DIRECTED    No facility-administered encounter medications on file as of 05/20/2022.  Vicky Mccanless was contacted to remind of upcoming telephone visit with Charlene Brooke on  05/23/22 at 11:00. Patient was reminded to have any blood glucose and blood pressure readings available for review at appointment.   Patient confirmed appointment.  Are you having any problems with your medications? No   Do you have any concerns you like to discuss with the pharmacist? Patient has found out he may have prostate cancer, has several appointments this week.  CCM referral has been placed prior to visit?  Yes   Star Rating Drugs: Medication:  Last Fill: Day Supply Jardiance $RemoveBeforeDE'10mg'ngRPzKejNeaAguj$  05/06/22 90 Metformin $RemoveBefo'500mg'pnvrfuVGQRM$  04/19/22 90 Rosuvastatin $RemoveBeforeD'5mg'[id number]$  05/06/22 [place], CPP notified  Avel Sensor, [place]  [phone]

## 2022-05-21 ENCOUNTER — Ambulatory Visit
Admission: RE | Admit: 2022-05-21 | Discharge: 2022-05-21 | Disposition: A | Discharge status: Home / Self Care | Payer: Medicare HMO | Source: Ambulatory Visit | Attending: Radiation Oncology | Admitting: Radiation Oncology

## 2022-05-21 ENCOUNTER — Encounter: Payer: Self-pay | Admitting: Radiation Oncology

## 2022-05-21 ENCOUNTER — Other Ambulatory Visit: Payer: Self-pay

## 2022-05-21 VITALS — BP 134/83 | HR 90 | Temp 98.6°F | Resp 16 | Ht 71.0 in | Wt 245.0 lb

## 2022-05-21 DIAGNOSIS — Z79899 Other long term (current) drug therapy: Secondary | ICD-10-CM | POA: Diagnosis not present

## 2022-05-21 DIAGNOSIS — I10 Essential (primary) hypertension: Secondary | ICD-10-CM | POA: Diagnosis not present

## 2022-05-21 DIAGNOSIS — Z87891 Personal history of nicotine dependence: Secondary | ICD-10-CM | POA: Insufficient documentation

## 2022-05-21 DIAGNOSIS — Z7984 Long term (current) use of oral hypoglycemic drugs: Secondary | ICD-10-CM | POA: Insufficient documentation

## 2022-05-21 DIAGNOSIS — C61 Malignant neoplasm of prostate: Secondary | ICD-10-CM | POA: Insufficient documentation

## 2022-05-21 DIAGNOSIS — E119 Type 2 diabetes mellitus without complications: Secondary | ICD-10-CM | POA: Diagnosis not present

## 2022-05-21 DIAGNOSIS — E785 Hyperlipidemia, unspecified: Secondary | ICD-10-CM | POA: Insufficient documentation

## 2022-05-21 DIAGNOSIS — Z803 Family history of malignant neoplasm of breast: Secondary | ICD-10-CM | POA: Insufficient documentation

## 2022-05-21 DIAGNOSIS — Z191 Hormone sensitive malignancy status: Secondary | ICD-10-CM | POA: Diagnosis not present

## 2022-05-21 DIAGNOSIS — Z86010 Personal history of colonic polyps: Secondary | ICD-10-CM | POA: Insufficient documentation

## 2022-05-21 DIAGNOSIS — M109 Gout, unspecified: Secondary | ICD-10-CM | POA: Diagnosis not present

## 2022-05-21 MED ORDER — LABETALOL HCL 100 MG PO TABS: 50.0000 mg | ORAL_TABLET | Freq: Two times a day (BID) | ORAL | 0 refills | Status: DC

## 2022-05-21 NOTE — Consult Note (Signed)
NEW PATIENT EVALUATION  Name: Jeremy Andrews  MRN: [id number]  Date:   05/21/2022     DOB: January 11, 1956   This 66 y.o. male patient presents to the clinic for initial evaluation of stage IIb (cT1 cN0 M0) mostly Gleason 7 (3+4) adenocarcinoma the prostate presenting with a PSA of 13.  REFERRING PHYSICIAN: Waunita Schooner, MD  CHIEF COMPLAINT:  Chief Complaint  Patient presents with   Prostate Cancer    DIAGNOSIS: The encounter diagnosis was Malignant neoplasm of prostate ([place]).   PREVIOUS INVESTIGATIONS:  MRI prostate reviewed Pathology reports reviewed Clinical notes reviewed  HPI: Patient is a 66 year old male with an elevated PSA originally around 10 climbed to 13.1.  He underwent MRI scan of his prostate showing a PI-RADS 5 lesion in the right peripheral zone suspicious for carcinoma.  Underwent transrectal ultrasound-guided biopsy showing 4 of 12 cores positive for mostly Gleason 7 (3+4) although there was 1 Gleason 7 (4+3).  Patient is fairly asymptomatic does have nocturia times at least 3 no urgency or frequency no real bowel problems.  He is having no bone pain.  He has had discussions about prostatectomy is now referred to radiation oncology for consideration of treatment. PLANNED TREATMENT REGIMEN: Image guided IMRT radiation therapy to his prostate plus ADT therapy  PAST MEDICAL HISTORY:  has a past medical history of Allergy, Colon polyps, Diabetes mellitus without complication ([place]), Gout, Hyperlipidemia, and Hypertension.    PAST SURGICAL HISTORY:  Past Surgical History:  Procedure Laterality Date   COLONOSCOPY W/ POLYPECTOMY N/A    NO PAST SURGERIES      FAMILY HISTORY: family history includes Arthritis in his brother; Breast cancer in his sister; Breast cancer (age of onset: 52) in his maternal grandmother; Diabetes in his brother, mother, and sister; Gout in his brother and mother; Hypertension in his father, mother, and sister; Kidney disease in his sister; Kidney  failure in his sister; Other in his mother and sister.  SOCIAL HISTORY:  reports that he quit smoking about 33 years ago. His smoking use included cigarettes. He has a 12.50 pack-year smoking history. He has never used smokeless tobacco. He reports current alcohol use of about 2.0 - 3.0 standard drinks of alcohol per week. He reports that he does not currently use drugs after having used the following drugs: Marijuana.  ALLERGIES: Lisinopril and Statins  MEDICATIONS:  Current Outpatient Medications  Medication Sig Dispense Refill   Alcohol Swabs (B-D SINGLE USE SWABS REGULAR) PADS USE UP TO FOUR TIMES DAILY AS DIRECTED 1 each 2   allopurinol (ZYLOPRIM) 100 MG tablet Take 1 tablet (100 mg total) by mouth daily. 90 tablet 3   amLODipine (NORVASC) 10 MG tablet Take 1 tablet (10 mg total) by mouth daily. 90 tablet 1   blood glucose meter kit and supplies KIT Dispense based on patient and insurance preference. Use up to four times daily as directed. 1 each 0   celecoxib (CELEBREX) 200 MG capsule TAKE 1 CAPSULE (200 MG TOTAL) BY MOUTH AS NEEDED FOR MILD PAIN OR MODERATE PAIN 30 capsule 0   cetirizine (ZYRTEC) 10 MG tablet TAKE 1 TABLET BY MOUTH EVERY DAY 90 tablet 3   Ferrous Sulfate (IRON) 325 (65 Fe) MG TABS Take 1 tablet (325 mg total) by mouth daily. 30 tablet 3   JARDIANCE 10 MG TABS tablet TAKE 1 TABLET EVERY DAY BEFORE BREAKFAST 90 tablet 0   labetalol (NORMODYNE) 100 MG tablet TAKE 1/2 TABLET TWICE DAILY 90 tablet 0   levofloxacin (  LEVAQUIN) 750 MG tablet Take 750 mg by mouth daily.     metFORMIN (GLUCOPHAGE) 500 MG tablet Take 2 tablets (1,000 mg total) by mouth 2 (two) times daily with a meal. 360 tablet 1   omeprazole (PRILOSEC) 20 MG capsule Take 1 capsule (20 mg total) by mouth daily. 90 capsule 1   rosuvastatin (CRESTOR) 5 MG tablet TAKE 1 TABLET EVERY WEEK 12 tablet 0   tamsulosin (FLOMAX) 0.4 MG CAPS capsule TAKE 1 CAPSULE BY MOUTH EVERY DAY 30 capsule 0   TRUE METRIX BLOOD GLUCOSE  TEST test strip USE  UP  TO FOUR TIMES DAILY AS DIRECTED 100 strip 1   TRUEplus Lancets 33G MISC USE  UP  TO FOUR TIMES DAILY AS DIRECTED 100 each 1   No current facility-administered medications for this encounter.    ECOG PERFORMANCE STATUS:  0 - Asymptomatic  REVIEW OF SYSTEMS: Patient denies any weight loss, fatigue, weakness, fever, chills or night sweats. Patient denies any loss of vision, blurred vision. Patient denies any ringing  of the ears or hearing loss. No irregular heartbeat. Patient denies heart murmur or history of fainting. Patient denies any chest pain or pain radiating to her upper extremities. Patient denies any shortness of breath, difficulty breathing at night, cough or hemoptysis. Patient denies any swelling in the lower legs. Patient denies any nausea vomiting, vomiting of blood, or coffee ground material in the vomitus. Patient denies any stomach pain. Patient states has had normal bowel movements no significant constipation or diarrhea. Patient denies any dysuria, hematuria or significant nocturia. Patient denies any problems walking, swelling in the joints or loss of balance. Patient denies any skin changes, loss of hair or loss of weight. Patient denies any excessive worrying or anxiety or significant depression. Patient denies any problems with insomnia. Patient denies excessive thirst, polyuria, polydipsia. Patient denies any swollen glands, patient denies easy bruising or easy bleeding. Patient denies any recent infections, allergies or URI. Patient "s visual fields have not changed significantly in recent time.   PHYSICAL EXAM: BP 134/83 (BP Location: Left Arm, Patient Position: Sitting, Cuff Size: Large)   Pulse 90   Temp 98.6 F (37 C) (Tympanic)   Resp 16   Ht 5' 11"  (1.803 m)   Wt 245 lb (111.1 kg)   BMI 34.17 kg/m  Well-developed well-nourished patient in NAD. HEENT reveals PERLA, EOMI, discs not visualized.  Oral cavity is clear. No oral mucosal lesions are  identified. Neck is clear without evidence of cervical or supraclavicular adenopathy. Lungs are clear to A&P. Cardiac examination is essentially unremarkable with regular rate and rhythm without murmur rub or thrill. Abdomen is benign with no organomegaly or masses noted. Motor sensory and DTR levels are equal and symmetric in the upper and lower extremities. Cranial nerves II through XII are grossly intact. Proprioception is intact. No peripheral adenopathy or edema is identified. No motor or sensory levels are noted. Crude visual fields are within normal range.  LABORATORY DATA: Pathology reports reviewed    RADIOLOGY RESULTS: MRI of prostate reviewed compatible with above-stated findings   IMPRESSION: Stage IIb Gleason 7 (3+4) adenocarcinoma the prostate presenting with a PSA of 37 in 66 year old male  PLAN: This time of gone over treatment options as far as radiation is concerned both external beam treatment as well as I-125 interstitial implant.  Patient has declined robotic prostatectomy.  He favors going ahead with image guided IMRT radiation.  We will plan on delivering 80 Gray over 8 weeks.  Of asked Dr. Bernardo Heater to place fiducial markers in his prostate for daily image guided treatment.  I would also like him to receive 6 months of ADT suppression and of asked Dr. Dene Gentry office to provide that.  Risks and benefits of treatment including increased lower urinary tract symptoms diarrhea fatigue alteration of blood counts and skin reaction all were reviewed with the patient.  He comprehends my treatment plan well.  We will simulate him once his markers are placed.  I would like to take this opportunity to thank you for allowing me to participate in the care of your patient.Noreene Filbert, MD

## 2022-05-22 DIAGNOSIS — Z79899 Other long term (current) drug therapy: Secondary | ICD-10-CM | POA: Diagnosis not present

## 2022-05-22 DIAGNOSIS — Z5181 Encounter for therapeutic drug level monitoring: Secondary | ICD-10-CM | POA: Diagnosis not present

## 2022-05-22 DIAGNOSIS — M1000 Idiopathic gout, unspecified site: Secondary | ICD-10-CM | POA: Diagnosis not present

## 2022-05-23 ENCOUNTER — Ambulatory Visit: Payer: Medicare HMO | Admitting: Pharmacist

## 2022-05-23 DIAGNOSIS — I10 Essential (primary) hypertension: Secondary | ICD-10-CM

## 2022-05-23 DIAGNOSIS — E785 Hyperlipidemia, unspecified: Secondary | ICD-10-CM

## 2022-05-23 DIAGNOSIS — E1169 Type 2 diabetes mellitus with other specified complication: Secondary | ICD-10-CM

## 2022-05-23 NOTE — Progress Notes (Signed)
Chronic Care Management Pharmacy Note  05/23/2022 Name:  Jeremy Andrews MRN:  [id number] DOB:  July 30, 1956  Summary: CCM F/U visit -Reviewed medications; pt affirmed adherence -Recent diagnosis of prostate cancer, setting up radiation therapy -DM: A1c 5.9% (04/2022) improved from 10.2% with medication adjustments and nutrition changes; he reports fasting BG typically 100-140 -HLD: LDL 119 (01/2022); pt started rouvastatin weekly in July, due for repeat lipids  Recommendations/Changes made from today's visit: -No med changes -Repeat lipid panel at next OV  Plan: -Pharmacist follow up televisit PRN -PCP appt 07/15/22 (TOC - Eugenia Pancoast)    Subjective: Jeremy Andrews is an 66 y.o. year old male who is a primary patient of Jeremy Schooner, MD.  The CCM team was consulted for assistance with disease management and care coordination needs.    Engaged with patient by telephone for follow up visit in response to provider referral for pharmacy case management and/or care coordination services.   Consent to Services:  The patient was given information about Chronic Care Management services, agreed to services, and gave verbal consent prior to initiation of services.  Please see initial visit note for detailed documentation.   Patient Care Team: Jeremy Schooner, MD as PCP - General (Family Medicine) Jeremy Cowboy, MD as Referring Physician (Gastroenterology) Jeremy Karvonen, RN as Case Manager Jeremy Andrews, [facility] as Pharmacist (Pharmacist)  Recent office visits: 04/15/22 Dr Jeremy Andrews OV: A1c 5.9%; continue meds; RTC 3 month, can consider reducing meds if remains A1c < 6%. Discussed increasing crestor to 2x weekly but will wait until biopsy/psa complete.  01/11/22 Dr Jeremy Andrews OV: f/u DM. A1C 10.2%. Increase metformin to 1000 mg BID, Start Jardiance 10 mg. Check fasting BG. Address statin therapy 1 month.  01/08/22 Dr Jeremy Andrews OV: CPE- referred to urology for PSA.  Recent consult visits: 05/22/22 Dr Jeremy Andrews  (Rheumatology): gout, OA. Advised voltaren gel for OA. No med changes.  05/21/22 Dr Jeremy Andrews (Rad Onc): will pursue IMRT radiation, declined prostatectomy. Rec 6 months ADT suppression.   05/09/22 Dr Jeremy Andrews (Urology): prostate cancer - T1, N0, Mx. Will pursue radiation - referred to radiation oncology.  03/07/22 Nutrition 01/23/22 Dr Jeremy Andrews (Urology): elevated PSA - start tamsulosin 0.4 mg. Repeat PSA 1 month. 01/22/22 Nutrition - DM education  Hospital visits: None in previous 6 months   Objective:  Lab Results  Component Value Date   CREATININE 0.88 01/08/2022   BUN 8 01/08/2022   GFR 88.62 01/08/2022   GFRNONAA >60 05/04/2019   GFRAA >60 05/04/2019   NA 136 01/08/2022   K 4.5 01/08/2022   CALCIUM 9.7 01/08/2022   CO2 28 01/08/2022   GLUCOSE 226 (H) 01/08/2022    Lab Results  Component Value Date/Time   HGBA1C 5.9 (A) 04/15/2022 09:36 AM   HGBA1C 10.2 (H) 01/08/2022 10:04 AM   HGBA1C 6.2 08/31/2019 11:04 AM   HGBA1C 5.6 09/21/2018 12:00 AM   GFR 88.62 01/08/2022 10:04 AM   GFR 117.13 08/31/2019 11:04 AM   MICROALBUR 166.3 (H) 01/08/2022 10:04 AM   MICROALBUR 39.4 (H) 08/31/2019 12:36 PM    Last diabetic Eye exam:  Lab Results  Component Value Date/Time   HMDIABEYEEXA Retinopathy (A) 06/26/2021 12:00 AM   HMDIABEYEEXA Retinopathy (A) 06/26/2021 12:00 AM    Last diabetic Foot exam: No results found for: "HMDIABFOOTEX"   Lab Results  Component Value Date   CHOL 184 01/08/2022   HDL 31.20 (L) 01/08/2022   LDLCALC 119 (H) 01/08/2022   TRIG 168.0 (H) 01/08/2022   CHOLHDL  6 01/08/2022       Latest Ref Rng & Units 01/08/2022   10:04 AM 08/31/2019   11:04 AM 09/21/2018   12:00 AM  Hepatic Function  Total Protein 6.0 - 8.3 g/dL 7.2  7.0    Albumin 3.5 - 5.2 g/dL 3.9  4.0    AST 0 - 37 U/L 45  37  123      ALT 0 - 53 U/L 36  28  57      Alk Phosphatase 39 - 117 U/L 61  62  100      Total Bilirubin 0.2 - 1.2 mg/dL 0.6  0.4       This result is from an external  source.    No results found for: "TSH", "FREET4"     Latest Ref Rng & Units 01/08/2022   10:04 AM 11/01/2019    9:49 AM 08/31/2019   11:04 AM  CBC  WBC 4.0 - 10.5 K/uL 4.3  6.6  5.8   Hemoglobin 13.0 - 17.0 g/dL 12.7  12.5  10.8   Hematocrit 39.0 - 52.0 % 39.8  37.8  33.5   Platelets 150.0 - 400.0 K/uL 168.0  197.0  224.0     No results found for: "VD25OH"  Clinical ASCVD: No  The 10-year ASCVD risk score (Arnett DK, et al., 2019) is: 36%   Values used to calculate the score:     Age: 54 years     Sex: Male     Is Non-Hispanic African American: Yes     Diabetic: Yes     Tobacco smoker: No     Systolic Blood Pressure: 081 mmHg     Is BP treated: Yes     HDL Cholesterol: 31.2 mg/dL     Total Cholesterol: 184 mg/dL       01/22/2022    3:12 PM 11/01/2021   10:34 AM 02/16/2021    9:22 AM  Depression screen PHQ 2/9  Decreased Interest 0 0 0  Down, Depressed, Hopeless 0 0 0  PHQ - 2 Score 0 0 0     Social History   Tobacco Use  Smoking Status Former   Packs/day: 0.50   Years: 25.00   Total pack years: 12.50   Types: Cigarettes   Quit date: 1990   Years since quitting: 33.8  Smokeless Tobacco Never   BP Readings from Last 3 Encounters:  05/21/22 134/83  05/09/22 130/71  04/15/22 130/82   Pulse Readings from Last 3 Encounters:  05/21/22 90  05/09/22 (!) 112  04/15/22 (!) 104   Wt Readings from Last 3 Encounters:  05/21/22 245 lb (111.1 kg)  05/09/22 242 lb (109.8 kg)  04/15/22 244 lb (110.7 kg)   BMI Readings from Last 3 Encounters:  05/21/22 34.17 kg/m  05/09/22 33.75 kg/m  04/15/22 34.03 kg/m    Assessment/Interventions: Review of patient past medical history, allergies, medications, health status, including review of consultants reports, laboratory and other test data, was performed as part of comprehensive evaluation and provision of chronic care management services.   SDOH:  (Social Determinants of Health) assessments and interventions performed:  No - done March 2023 SDOH Interventions    Flowsheet Row Office Visit from 11/01/2021 in [place] at [place] Management from 02/16/2021 in [place] at [place] from 10/31/2020 in [place] at [place] Interventions Intervention Not Indicated Intervention Not Indicated --  Housing  Interventions Intervention Not Indicated Intervention Not Indicated --  Transportation Interventions Intervention Not Indicated Intervention Not Indicated --  Depression Interventions/Treatment  -- -- YBW3-8 Score <4 Follow-up Not Indicated  Financial Strain Interventions Intervention Not Indicated -- --  Physical Activity Interventions Intervention Not Indicated -- --  Social Connections Interventions Intervention Not Indicated -- --      SDOH Screenings   Food Insecurity: No Food Insecurity (11/01/2021)  Housing: Low Risk  (11/01/2021)  Transportation Needs: No Transportation Needs (11/01/2021)  Alcohol Screen: Low Risk  (11/01/2021)  Depression (PHQ2-9): Low Risk  (01/22/2022)  Financial Resource Strain: Low Risk  (11/01/2021)  Physical Activity: Sufficiently Active (11/01/2021)  Social Connections: Moderately Integrated (11/01/2021)  Stress: No Stress Concern Present (10/31/2020)  Tobacco Use: Medium Risk (05/21/2022)    CCM Care Plan  Allergies  Allergen Reactions   Lisinopril Swelling   Statins     Severe myopathy, in wheel chair Severe myopathy, in wheel chair    Medications Reviewed Today     Reviewed by Jeremy Andrews, [facility] (Pharmacist) on 05/23/22 at [place] List Status: <None>   Medication Order Taking? Sig Documenting Provider Last Dose Status Informant  Alcohol Swabs (B-D SINGLE USE SWABS REGULAR) PADS [id number] Yes USE UP TO FOUR TIMES DAILY AS DIRECTED Jeremy Schooner, MD Taking Active   allopurinol (ZYLOPRIM) 100 MG tablet [id number] Yes Take 1 tablet (100 mg total) by mouth  daily. Jeremy Schooner, MD Taking Active   amLODipine (NORVASC) 10 MG tablet [id number] Yes Take 1 tablet (10 mg total) by mouth daily. Jeremy Schooner, MD Taking Active   blood glucose meter kit and supplies KIT [id number] Yes Dispense based on patient and insurance preference. Use up to four times daily as directed. Jeremy Schooner, MD Taking Active            Med Note Rolan Bucco May 23, 2022 11:20 AM)    celecoxib (CELEBREX) 200 MG capsule [id number] Yes TAKE 1 CAPSULE (200 MG TOTAL) BY MOUTH AS NEEDED FOR MILD PAIN OR MODERATE PAIN Dutch Quint B, FNP Taking Active   cetirizine (ZYRTEC) 10 MG tablet [id number] Yes TAKE 1 TABLET BY MOUTH EVERY DAY Jeremy Schooner, MD Taking Active   Ferrous Sulfate (IRON) 325 (65 Fe) MG TABS [id number] Yes Take 1 tablet (325 mg total) by mouth daily. Jeremy Schooner, MD Taking Active   JARDIANCE 10 MG TABS tablet [id number] Yes TAKE 1 TABLET EVERY DAY BEFORE Theodoro Parma, MD Taking Active   labetalol (NORMODYNE) 100 MG tablet [id number] Yes Take 0.5 tablets (50 mg total) by mouth 2 (two) times daily. Dutch Quint B, FNP Taking Active   levofloxacin (LEVAQUIN) 750 MG tablet [id number] Yes Take 750 mg by mouth daily. [provider] Taking Active   metFORMIN (GLUCOPHAGE) 500 MG tablet [id number] Yes Take 2 tablets (1,000 mg total) by mouth 2 (two) times daily with a meal. Jeremy Schooner, MD Taking Active            Med Note Rolan Bucco May 23, 2022 11:21 AM)    omeprazole (PRILOSEC) 20 MG capsule [id number] Yes Take 1 capsule (20 mg total) by mouth daily. Jeremy Schooner, MD Taking Active   rosuvastatin (CRESTOR) 5 MG tablet [id number] Yes TAKE 1 TABLET EVERY WEEK Jeremy Schooner, MD Taking Active   tamsulosin (FLOMAX) 0.4 MG CAPS capsule [id number] Yes TAKE 1 CAPSULE BY MOUTH EVERY DAY Stoioff, Ronda Fairly, MD Taking Active   TRUE METRIX BLOOD GLUCOSE  TEST test strip [id number] Yes USE  UP  TO FOUR TIMES DAILY AS DIRECTED Jeremy Schooner, MD  Taking Active   TRUEplus Lancets 33G [place] [id number] Yes USE  UP  TO FOUR TIMES DAILY AS DIRECTED Jeremy Schooner, MD Taking Active             Patient Active Problem List   Diagnosis Date Noted   Elevated PSA 04/15/2022   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[place]) 01/11/2022   CKD stage 2 due to type 2 diabetes mellitus ([place]) 01/11/2022   Alcohol use 01/08/2022   Type 2 diabetes mellitus with other specified complication ([employer]) 78/24/2353   Myalgia due to statin 05/03/2020   Microcytic anemia 11/01/2019   Dyspnea on exertion 08/31/2019   Pain due to onychomycosis of toenails of both feet 02/18/2019   Essential hypertension 02/09/2019   Hyperlipidemia 02/09/2019   GERD (gastroesophageal reflux disease) 02/09/2019   Gout 02/09/2019   Environmental allergies 02/09/2019    Immunization History  Administered Date(s) Administered   PFIZER Comirnaty(Gray Top)Covid-19 Tri-Sucrose Vaccine 09/26/2020   PFIZER(Purple Top)SARS-COV-2 Vaccination 09/28/2019, 10/19/2019   Pneumococcal Conjugate-13 03/07/2017   Pneumococcal Polysaccharide-23 08/31/2019   Tdap 03/07/2017    Conditions to be addressed/monitored:  Hypertension, Hyperlipidemia, Diabetes, GERD, and Gout  Care Plan : [place] plan  Updates made by Jeremy Andrews, [place] since 05/23/2022 12:00 AM     Problem: Hypertension, Hyperlipidemia, Diabetes, GERD, and Gout   Priority: High     Long-Range Goal: Disease mgmt   Start Date: 01/18/2022  Expected End Date: 01/19/2023  This Visit's Progress: On track  Recent Progress: On track  Priority: High  Note:   Current Barriers:  Due for repeat lipids  Pharmacist Clinical Goal(s):  Patient will achieve improvement in HLD as evidenced by lipid panel through collaboration with PharmD and provider.   Interventions: 1:1 collaboration with Lesleigh Noe, MD regarding development and update of comprehensive plan of care as evidenced by provider attestation and  co-signature Inter-disciplinary care team collaboration (see longitudinal plan of care) Comprehensive medication review performed; medication list updated in electronic medical record  Hypertension (BP goal <130/80) -Controlled - BP at goal in recent OV; pt affirms compliance as prescribed -Current treatment: Amlodipine 10 mg daily - Appropriate, Effective, Safe, Accessible Labetolol 100 mg - 1/2 tab BID - Appropriate, Effective, Safe, Accessible -Medications previously tried: lisinopril  -Educated on BP goals and benefits of medications for prevention of heart attack, stroke and kidney damage; -Counseled to monitor BP at home periodically -Recommended to continue current medication  Hyperlipidemia: (LDL goal < 70) -Query controlled - LDL 119 (01/2022); rosuvastatin was started afterward -Current treatment: Rosuvastatin 5 mg once weekly - Appropriate, Query Effective -Medications previously tried: none  -Educated on Cholesterol goals; Benefits of statin for ASCVD risk reduction; -Recommend to continue current medication  Diabetes (A1c goal <7%) -Controlled -  A1c 5.9% (04/2022), improved from 10.2% with medication adjustment and nutrition improvements -Current home glucose readings:   Fasting BG: 100-150 -Current medications: Metformin 500 mg - 2 tab BID - Appropriate, Effective, Safe, Accessible Jardiance 10 mg daily -Appropriate, Effective, Safe, Accessible Testing supplies -Medications previously tried: n/a  -Educated on A1c and blood sugar goals; Complications of diabetes including kidney damage, retinal damage, and cardiovascular disease; Exercise goal of 150 minutes per week; Benefits of routine self-monitoring of blood sugar; Carbohydrate counting and/or plate method -Recommend to continue current medication; f/u PCP 04/15/22  Gout (Goal: prevent flares) -Controlled - pt reports last flare  over 2 years ago -Current treatment  Allopurinol 100 mg daily - Appropriate, Effective,  Safe, Accessible -Medications previously tried: n/a  -Recommended to continue current medication  GERD (Goal: minimize symptoms of reflux ) -Controlled - pt denies issue with relfux, wonders if he still needs PPI -Hx of Bleeds/ulcers: No -Current treatment  Omeprazole 20 mg daily - Query Appropriate -Medications previously tried: none reported  -Recommend taper omeprazole: take every other day for 1-2 weeks, then stop and take PRN; can resume if symptoms return  Health Maintenance -Vaccine gaps: Shingrix. -Prostate Cancer Dx 05/2022; pursuing radiation treatment  Patient Goals/Self-Care Activities Patient will:  - take medications as prescribed as evidenced by patient report and record review focus on medication adherence by routine check glucose daily, document, and provide at future appointments target a minimum of 150 minutes of moderate intensity exercise weekly engage in dietary modifications by limit candy; use plate method to plan meals        Medication Assistance: None required.  Patient affirms current coverage meets needs.  Compliance/Adherence/Medication fill history: Care Gaps: Colonoscopy  Star-Rating Drugs: Metformin - PDC 27%; LF 01/02/22 x 90 ds, 05/09/21 x 90 ds Jardiance - PDC 33%  Medication Access: Within the past 30 days, how often has patient missed a dose of medication? 0 Is a pillbox or other method used to improve adherence? No  Factors that may affect medication adherence? no barriers identified Are meds synced by current pharmacy? No  Are meds delivered by current pharmacy? Yes  Does patient experience delays in picking up medications due to transportation concerns? No   [facility] Services Reviewed: Is patient disadvantaged to use [facility]?: Yes  Current Rx insurance plan: [employer] [place] Name and location of Current pharmacy:  [id number] [place] [id number]:  [phone]: [phone] [address]: [phone]: [phone] [facility] services reviewed with patient today?: No  Patient requests to transfer care to [facility]?: No  Reason patient declined to change pharmacies: Disadvantaged due to insurance/mail order   Care Plan and Follow Up Patient Decision:  Patient agrees to Care Plan and Follow-up.  Plan: The patient has been provided with contact information for the care management team and has been advised to call with any health related questions or concerns.   LCharlene Brooke PharmD, BCACP Clinical Pharmacist [phone]

## 2022-05-23 NOTE — Patient Instructions (Signed)
Visit Information  Phone number for Pharmacist: [phone]   Goals Addressed   None     Care Plan : [place] plan  Updates made by Charlton Haws, [place] since 05/23/2022 12:00 AM     Problem: Hypertension, Hyperlipidemia, Diabetes, GERD, and Gout   Priority: High     Long-Range Goal: Disease mgmt   Start Date: 01/18/2022  Expected End Date: 01/19/2023  This Visit's Progress: On track  Recent Progress: On track  Priority: High  Note:   Current Barriers:  Due for repeat lipids  Pharmacist Clinical Goal(s):  Patient will achieve improvement in HLD as evidenced by lipid panel through collaboration with PharmD and provider.   Interventions: 1:1 collaboration with Lesleigh Noe, MD regarding development and update of comprehensive plan of care as evidenced by provider attestation and co-signature Inter-disciplinary care team collaboration (see longitudinal plan of care) Comprehensive medication review performed; medication list updated in electronic medical record  Hypertension (BP goal <130/80) -Controlled - BP at goal in recent OV; pt affirms compliance as prescribed -Current treatment: Amlodipine 10 mg daily - Appropriate, Effective, Safe, Accessible Labetolol 100 mg - 1/2 tab BID - Appropriate, Effective, Safe, Accessible -Medications previously tried: lisinopril  -Educated on BP goals and benefits of medications for prevention of heart attack, stroke and kidney damage; -Counseled to monitor BP at home periodically -Recommended to continue current medication  Hyperlipidemia: (LDL goal < 70) -Query controlled - LDL 119 (01/2022); rosuvastatin was started afterward -Current treatment: Rosuvastatin 5 mg once weekly - Appropriate, Query Effective -Medications previously tried: none  -Educated on Cholesterol goals; Benefits of statin for ASCVD risk reduction; -Recommend to continue current medication  Diabetes (A1c goal <7%) -Controlled -  A1c 5.9%  (04/2022), improved from 10.2% with medication adjustment and nutrition improvements -Current home glucose readings:   Fasting BG: 100-150 -Current medications: Metformin 500 mg - 2 tab BID - Appropriate, Effective, Safe, Accessible Jardiance 10 mg daily -Appropriate, Effective, Safe, Accessible Testing supplies -Medications previously tried: n/a  -Educated on A1c and blood sugar goals; Complications of diabetes including kidney damage, retinal damage, and cardiovascular disease; Exercise goal of 150 minutes per week; Benefits of routine self-monitoring of blood sugar; Carbohydrate counting and/or plate method -Recommend to continue current medication; f/u PCP 04/15/22  Gout (Goal: prevent flares) -Controlled - pt reports last flare over 2 years ago -Current treatment  Allopurinol 100 mg daily - Appropriate, Effective, Safe, Accessible -Medications previously tried: n/a  -Recommended to continue current medication  GERD (Goal: minimize symptoms of reflux ) -Controlled - pt denies issue with relfux, wonders if he still needs PPI -Hx of Bleeds/ulcers: No -Current treatment  Omeprazole 20 mg daily - Query Appropriate -Medications previously tried: none reported  -Recommend taper omeprazole: take every other day for 1-2 weeks, then stop and take PRN; can resume if symptoms return  Health Maintenance -Vaccine gaps: Shingrix. -Prostate Cancer Dx 05/2022; pursuing radiation treatment  Patient Goals/Self-Care Activities Patient will:  - take medications as prescribed as evidenced by patient report and record review focus on medication adherence by routine check glucose daily, document, and provide at future appointments target a minimum of 150 minutes of moderate intensity exercise weekly engage in dietary modifications by limit candy; use plate method to plan meals      Patient verbalizes understanding of instructions and care plan provided today and agrees to view in [place]. Active  MyChart status and patient understanding of how to access instructions and care plan via MyChart confirmed  with patient.    Telephone follow up appointment with pharmacy team member scheduled for: PRN  Charlene Brooke, PharmD, BCACP Clinical Pharmacist [place] [facility] [phone]

## 2022-05-28 ENCOUNTER — Telehealth: Payer: Self-pay | Admitting: Family Medicine

## 2022-05-28 ENCOUNTER — Telehealth: Payer: Self-pay | Admitting: *Deleted

## 2022-05-28 NOTE — Telephone Encounter (Signed)
Patient notified of CT/Sim appointment 06/10/2022 at 10 AM.

## 2022-05-28 NOTE — Telephone Encounter (Signed)
NO PA required for Eligard Reference # [id number]

## 2022-06-04 DIAGNOSIS — C61 Malignant neoplasm of prostate: Secondary | ICD-10-CM | POA: Diagnosis not present

## 2022-06-04 DIAGNOSIS — Z191 Hormone sensitive malignancy status: Secondary | ICD-10-CM | POA: Diagnosis not present

## 2022-06-07 ENCOUNTER — Ambulatory Visit: Payer: Medicare HMO | Admitting: Urology

## 2022-06-07 VITALS — BP 129/82 | HR 79 | Ht 71.0 in | Wt 245.0 lb

## 2022-06-07 DIAGNOSIS — C61 Malignant neoplasm of prostate: Secondary | ICD-10-CM | POA: Diagnosis not present

## 2022-06-07 MED ORDER — LEUPROLIDE ACETATE (6 MONTH) 45 MG ~~LOC~~ KIT
45.0000 mg | PACK | Freq: Once | SUBCUTANEOUS | Status: AC
  Administered 2022-06-07: 45 mg via SUBCUTANEOUS

## 2022-06-07 MED ORDER — LEVOFLOXACIN 500 MG PO TABS
500.0000 mg | ORAL_TABLET | Freq: Once | ORAL | Status: AC
  Administered 2022-06-07: 500 mg via ORAL

## 2022-06-07 MED ORDER — GENTAMICIN SULFATE 40 MG/ML IJ SOLN
80.0000 mg | Freq: Once | INTRAMUSCULAR | Status: AC
  Administered 2022-06-07: 80 mg via INTRAMUSCULAR

## 2022-06-07 NOTE — Progress Notes (Signed)
Eligard SubQ Injection   Due to Prostate Cancer patient is present today for a Eligard Injection.  Medication: Eligard 6 month Dose: 45 mg  Location: left  Lot: [id number] Exp: 08/2023  Patient tolerated well, no complications were noted  Performed by: Elberta Leatherwood, [place]  Per Dr. Donella Stade patient is to continue therapy for 6 months. Gave reminder continue on Vitamin D 800-1000iu and Calcium 1000-'1200mg'$  daily while on Androgen Deprivation Therapy.  PA approval dates:  No PA required

## 2022-06-07 NOTE — Patient Instructions (Signed)
continue on Vitamin D 800-1000iu and Calcium 1000-'1200mg'$  daily while on Androgen Deprivation Therapy.

## 2022-06-07 NOTE — Progress Notes (Signed)
   06/07/22  CC: gold fiducial marker placement  HPI: 66 y.o. male with prostate cancer who presents today for placement of fiducial seed markers in anticipation of his upcoming IMRT with Dr. Baruch Gouty.  Prostate Gold fiducial Marker Placement Procedure   Informed consent was obtained after discussing risks/benefits of the procedure.  A time out was performed to ensure correct patient identity.  Pre-Procedure: - Gentamicin given prophylactically - PO Levaquin 500 mg also given today  Procedure: - Lidocaine jelly was administered per rectum - Rectal ultrasound probe was placed without difficulty and the prostate visualized - Prostatic block performed with 10 mL 1% Xylocaine - 3 fiducial gold seed markers placed, one at right base, one at left base, one at apex of prostate gland under transrectal ultrasound guidance  Post-Procedure: - Patient tolerated the procedure well - He was counseled to seek immediate medical attention if experiences any severe pain, significant bleeding, or fevers    John Giovanni, MD

## 2022-06-10 ENCOUNTER — Ambulatory Visit
Admission: RE | Admit: 2022-06-10 | Discharge: 2022-06-10 | Disposition: A | Discharge status: Home / Self Care | Payer: Medicare HMO | Source: Ambulatory Visit | Attending: Radiation Oncology | Admitting: Radiation Oncology

## 2022-06-10 ENCOUNTER — Other Ambulatory Visit: Payer: Self-pay

## 2022-06-10 DIAGNOSIS — I10 Essential (primary) hypertension: Secondary | ICD-10-CM | POA: Insufficient documentation

## 2022-06-10 DIAGNOSIS — M109 Gout, unspecified: Secondary | ICD-10-CM | POA: Insufficient documentation

## 2022-06-10 DIAGNOSIS — E119 Type 2 diabetes mellitus without complications: Secondary | ICD-10-CM | POA: Insufficient documentation

## 2022-06-10 DIAGNOSIS — E785 Hyperlipidemia, unspecified: Secondary | ICD-10-CM | POA: Diagnosis not present

## 2022-06-10 DIAGNOSIS — Z803 Family history of malignant neoplasm of breast: Secondary | ICD-10-CM | POA: Diagnosis not present

## 2022-06-10 DIAGNOSIS — Z191 Hormone sensitive malignancy status: Secondary | ICD-10-CM | POA: Diagnosis not present

## 2022-06-10 DIAGNOSIS — Z86010 Personal history of colonic polyps: Secondary | ICD-10-CM | POA: Insufficient documentation

## 2022-06-10 DIAGNOSIS — Z79899 Other long term (current) drug therapy: Secondary | ICD-10-CM | POA: Diagnosis not present

## 2022-06-10 DIAGNOSIS — Z7984 Long term (current) use of oral hypoglycemic drugs: Secondary | ICD-10-CM | POA: Insufficient documentation

## 2022-06-10 DIAGNOSIS — C61 Malignant neoplasm of prostate: Secondary | ICD-10-CM | POA: Insufficient documentation

## 2022-06-10 DIAGNOSIS — Z87891 Personal history of nicotine dependence: Secondary | ICD-10-CM | POA: Insufficient documentation

## 2022-06-10 MED ORDER — AMLODIPINE BESYLATE 10 MG PO TABS: 10.0000 mg | ORAL_TABLET | Freq: Every day | ORAL | 0 refills | Status: DC

## 2022-06-14 ENCOUNTER — Other Ambulatory Visit: Payer: Self-pay | Admitting: *Deleted

## 2022-06-14 DIAGNOSIS — C61 Malignant neoplasm of prostate: Secondary | ICD-10-CM

## 2022-06-17 DIAGNOSIS — I10 Essential (primary) hypertension: Secondary | ICD-10-CM | POA: Diagnosis not present

## 2022-06-17 DIAGNOSIS — Z79899 Other long term (current) drug therapy: Secondary | ICD-10-CM | POA: Diagnosis not present

## 2022-06-17 DIAGNOSIS — M109 Gout, unspecified: Secondary | ICD-10-CM | POA: Diagnosis not present

## 2022-06-17 DIAGNOSIS — Z191 Hormone sensitive malignancy status: Secondary | ICD-10-CM | POA: Diagnosis not present

## 2022-06-17 DIAGNOSIS — E119 Type 2 diabetes mellitus without complications: Secondary | ICD-10-CM | POA: Diagnosis not present

## 2022-06-17 DIAGNOSIS — E785 Hyperlipidemia, unspecified: Secondary | ICD-10-CM | POA: Diagnosis not present

## 2022-06-17 DIAGNOSIS — Z87891 Personal history of nicotine dependence: Secondary | ICD-10-CM | POA: Diagnosis not present

## 2022-06-17 DIAGNOSIS — Z7984 Long term (current) use of oral hypoglycemic drugs: Secondary | ICD-10-CM | POA: Diagnosis not present

## 2022-06-17 DIAGNOSIS — Z86010 Personal history of colonic polyps: Secondary | ICD-10-CM | POA: Diagnosis not present

## 2022-06-17 DIAGNOSIS — C61 Malignant neoplasm of prostate: Secondary | ICD-10-CM | POA: Diagnosis not present

## 2022-06-18 ENCOUNTER — Other Ambulatory Visit: Payer: Self-pay

## 2022-06-18 MED ORDER — BD SWAB SINGLE USE REGULAR PADS: MEDICATED_PAD | 2 refills | Status: DC

## 2022-06-19 ENCOUNTER — Ambulatory Visit
Admission: RE | Admit: 2022-06-19 | Discharge: 2022-06-19 | Disposition: A | Discharge status: Home / Self Care | Payer: Medicare HMO | Source: Ambulatory Visit | Attending: Radiation Oncology | Admitting: Radiation Oncology

## 2022-06-20 ENCOUNTER — Other Ambulatory Visit: Payer: Self-pay

## 2022-06-20 ENCOUNTER — Ambulatory Visit
Admission: RE | Admit: 2022-06-20 | Discharge: 2022-06-20 | Disposition: A | Discharge status: Home / Self Care | Payer: Medicare HMO | Source: Ambulatory Visit | Attending: Radiation Oncology | Admitting: Radiation Oncology

## 2022-06-20 DIAGNOSIS — E119 Type 2 diabetes mellitus without complications: Secondary | ICD-10-CM | POA: Diagnosis not present

## 2022-06-20 DIAGNOSIS — Z79899 Other long term (current) drug therapy: Secondary | ICD-10-CM | POA: Diagnosis not present

## 2022-06-20 DIAGNOSIS — Z191 Hormone sensitive malignancy status: Secondary | ICD-10-CM | POA: Diagnosis not present

## 2022-06-20 DIAGNOSIS — Z510 Encounter for antineoplastic radiation therapy: Secondary | ICD-10-CM | POA: Diagnosis not present

## 2022-06-20 DIAGNOSIS — M109 Gout, unspecified: Secondary | ICD-10-CM | POA: Diagnosis not present

## 2022-06-20 DIAGNOSIS — Z7984 Long term (current) use of oral hypoglycemic drugs: Secondary | ICD-10-CM | POA: Diagnosis not present

## 2022-06-20 DIAGNOSIS — I10 Essential (primary) hypertension: Secondary | ICD-10-CM | POA: Diagnosis not present

## 2022-06-20 DIAGNOSIS — E785 Hyperlipidemia, unspecified: Secondary | ICD-10-CM | POA: Diagnosis not present

## 2022-06-20 DIAGNOSIS — Z86010 Personal history of colonic polyps: Secondary | ICD-10-CM | POA: Diagnosis not present

## 2022-06-20 DIAGNOSIS — Z87891 Personal history of nicotine dependence: Secondary | ICD-10-CM | POA: Diagnosis not present

## 2022-06-20 DIAGNOSIS — C61 Malignant neoplasm of prostate: Secondary | ICD-10-CM | POA: Diagnosis not present

## 2022-06-20 LAB — RAD ONC ARIA SESSION SUMMARY
Course Elapsed Days: 0
Plan Fractions Treated to Date: 1
Plan Prescribed Dose Per Fraction: 2 Gy
Plan Total Fractions Prescribed: 40
Plan Total Prescribed Dose: 80 Gy
Reference Point Dosage Given to Date: 2 Gy
Reference Point Session Dosage Given: 2 Gy
Session Number: 1

## 2022-06-21 ENCOUNTER — Other Ambulatory Visit: Payer: Self-pay

## 2022-06-21 ENCOUNTER — Ambulatory Visit
Admission: RE | Admit: 2022-06-21 | Discharge: 2022-06-21 | Disposition: A | Discharge status: Home / Self Care | Payer: Medicare HMO | Source: Ambulatory Visit | Attending: Radiation Oncology | Admitting: Radiation Oncology

## 2022-06-21 DIAGNOSIS — I10 Essential (primary) hypertension: Secondary | ICD-10-CM | POA: Diagnosis not present

## 2022-06-21 DIAGNOSIS — Z191 Hormone sensitive malignancy status: Secondary | ICD-10-CM | POA: Diagnosis not present

## 2022-06-21 DIAGNOSIS — C61 Malignant neoplasm of prostate: Secondary | ICD-10-CM | POA: Diagnosis not present

## 2022-06-21 DIAGNOSIS — M109 Gout, unspecified: Secondary | ICD-10-CM | POA: Diagnosis not present

## 2022-06-21 DIAGNOSIS — Z510 Encounter for antineoplastic radiation therapy: Secondary | ICD-10-CM | POA: Diagnosis not present

## 2022-06-21 DIAGNOSIS — Z86010 Personal history of colonic polyps: Secondary | ICD-10-CM | POA: Diagnosis not present

## 2022-06-21 DIAGNOSIS — E119 Type 2 diabetes mellitus without complications: Secondary | ICD-10-CM | POA: Diagnosis not present

## 2022-06-21 DIAGNOSIS — E785 Hyperlipidemia, unspecified: Secondary | ICD-10-CM | POA: Diagnosis not present

## 2022-06-21 DIAGNOSIS — Z7984 Long term (current) use of oral hypoglycemic drugs: Secondary | ICD-10-CM | POA: Diagnosis not present

## 2022-06-21 DIAGNOSIS — Z87891 Personal history of nicotine dependence: Secondary | ICD-10-CM | POA: Diagnosis not present

## 2022-06-21 DIAGNOSIS — Z79899 Other long term (current) drug therapy: Secondary | ICD-10-CM | POA: Diagnosis not present

## 2022-06-21 LAB — RAD ONC ARIA SESSION SUMMARY
Course Elapsed Days: 1
Plan Fractions Treated to Date: 2
Plan Prescribed Dose Per Fraction: 2 Gy
Plan Total Fractions Prescribed: 40
Plan Total Prescribed Dose: 80 Gy
Reference Point Dosage Given to Date: 4 Gy
Reference Point Session Dosage Given: 2 Gy
Session Number: 2

## 2022-06-24 ENCOUNTER — Ambulatory Visit
Admission: RE | Admit: 2022-06-24 | Discharge: 2022-06-24 | Disposition: A | Discharge status: Home / Self Care | Payer: Medicare HMO | Source: Ambulatory Visit | Attending: Radiation Oncology | Admitting: Radiation Oncology

## 2022-06-24 ENCOUNTER — Other Ambulatory Visit: Payer: Self-pay

## 2022-06-24 DIAGNOSIS — Z191 Hormone sensitive malignancy status: Secondary | ICD-10-CM | POA: Diagnosis not present

## 2022-06-24 DIAGNOSIS — Z87891 Personal history of nicotine dependence: Secondary | ICD-10-CM | POA: Diagnosis not present

## 2022-06-24 DIAGNOSIS — C61 Malignant neoplasm of prostate: Secondary | ICD-10-CM | POA: Diagnosis not present

## 2022-06-24 DIAGNOSIS — I10 Essential (primary) hypertension: Secondary | ICD-10-CM | POA: Diagnosis not present

## 2022-06-24 DIAGNOSIS — M109 Gout, unspecified: Secondary | ICD-10-CM | POA: Diagnosis not present

## 2022-06-24 DIAGNOSIS — Z86010 Personal history of colonic polyps: Secondary | ICD-10-CM | POA: Diagnosis not present

## 2022-06-24 DIAGNOSIS — Z510 Encounter for antineoplastic radiation therapy: Secondary | ICD-10-CM | POA: Diagnosis not present

## 2022-06-24 DIAGNOSIS — Z79899 Other long term (current) drug therapy: Secondary | ICD-10-CM | POA: Diagnosis not present

## 2022-06-24 DIAGNOSIS — Z7984 Long term (current) use of oral hypoglycemic drugs: Secondary | ICD-10-CM | POA: Diagnosis not present

## 2022-06-24 DIAGNOSIS — E785 Hyperlipidemia, unspecified: Secondary | ICD-10-CM | POA: Diagnosis not present

## 2022-06-24 DIAGNOSIS — E119 Type 2 diabetes mellitus without complications: Secondary | ICD-10-CM | POA: Diagnosis not present

## 2022-06-24 LAB — RAD ONC ARIA SESSION SUMMARY
Course Elapsed Days: 4
Plan Fractions Treated to Date: 3
Plan Prescribed Dose Per Fraction: 2 Gy
Plan Total Fractions Prescribed: 40
Plan Total Prescribed Dose: 80 Gy
Reference Point Dosage Given to Date: 6 Gy
Reference Point Session Dosage Given: 2 Gy
Session Number: 3

## 2022-06-25 ENCOUNTER — Ambulatory Visit
Admission: RE | Admit: 2022-06-25 | Discharge: 2022-06-25 | Disposition: A | Discharge status: Home / Self Care | Payer: Medicare HMO | Source: Ambulatory Visit | Attending: Radiation Oncology | Admitting: Radiation Oncology

## 2022-06-25 ENCOUNTER — Other Ambulatory Visit: Payer: Self-pay

## 2022-06-25 DIAGNOSIS — Z191 Hormone sensitive malignancy status: Secondary | ICD-10-CM | POA: Diagnosis not present

## 2022-06-25 DIAGNOSIS — M109 Gout, unspecified: Secondary | ICD-10-CM | POA: Diagnosis not present

## 2022-06-25 DIAGNOSIS — E119 Type 2 diabetes mellitus without complications: Secondary | ICD-10-CM | POA: Diagnosis not present

## 2022-06-25 DIAGNOSIS — C61 Malignant neoplasm of prostate: Secondary | ICD-10-CM | POA: Diagnosis not present

## 2022-06-25 DIAGNOSIS — Z86010 Personal history of colonic polyps: Secondary | ICD-10-CM | POA: Diagnosis not present

## 2022-06-25 DIAGNOSIS — E785 Hyperlipidemia, unspecified: Secondary | ICD-10-CM | POA: Diagnosis not present

## 2022-06-25 DIAGNOSIS — I10 Essential (primary) hypertension: Secondary | ICD-10-CM | POA: Diagnosis not present

## 2022-06-25 DIAGNOSIS — Z79899 Other long term (current) drug therapy: Secondary | ICD-10-CM | POA: Diagnosis not present

## 2022-06-25 DIAGNOSIS — Z7984 Long term (current) use of oral hypoglycemic drugs: Secondary | ICD-10-CM | POA: Diagnosis not present

## 2022-06-25 DIAGNOSIS — Z87891 Personal history of nicotine dependence: Secondary | ICD-10-CM | POA: Diagnosis not present

## 2022-06-25 DIAGNOSIS — Z510 Encounter for antineoplastic radiation therapy: Secondary | ICD-10-CM | POA: Diagnosis not present

## 2022-06-25 LAB — RAD ONC ARIA SESSION SUMMARY
Course Elapsed Days: 5
Plan Fractions Treated to Date: 4
Plan Prescribed Dose Per Fraction: 2 Gy
Plan Total Fractions Prescribed: 40
Plan Total Prescribed Dose: 80 Gy
Reference Point Dosage Given to Date: 8 Gy
Reference Point Session Dosage Given: 2 Gy
Session Number: 4

## 2022-06-25 MED ORDER — OMEPRAZOLE 20 MG PO CPDR
20.0000 mg | DELAYED_RELEASE_CAPSULE | Freq: Every day | ORAL | 1 refills | Status: DC
Start: 2022-06-25 — End: 2022-10-15

## 2022-06-25 NOTE — Telephone Encounter (Signed)
TOC 07/15/22 with Tabitha Dugal scheduled.

## 2022-06-26 ENCOUNTER — Other Ambulatory Visit: Payer: Self-pay

## 2022-06-26 ENCOUNTER — Ambulatory Visit
Admission: RE | Admit: 2022-06-26 | Discharge: 2022-06-26 | Disposition: A | Discharge status: Home / Self Care | Payer: Medicare HMO | Source: Ambulatory Visit | Attending: Radiation Oncology | Admitting: Radiation Oncology

## 2022-06-26 DIAGNOSIS — Z191 Hormone sensitive malignancy status: Secondary | ICD-10-CM | POA: Diagnosis not present

## 2022-06-26 DIAGNOSIS — Z86010 Personal history of colonic polyps: Secondary | ICD-10-CM | POA: Diagnosis not present

## 2022-06-26 DIAGNOSIS — C61 Malignant neoplasm of prostate: Secondary | ICD-10-CM | POA: Diagnosis not present

## 2022-06-26 DIAGNOSIS — Z510 Encounter for antineoplastic radiation therapy: Secondary | ICD-10-CM | POA: Diagnosis not present

## 2022-06-26 DIAGNOSIS — E119 Type 2 diabetes mellitus without complications: Secondary | ICD-10-CM | POA: Diagnosis not present

## 2022-06-26 DIAGNOSIS — Z87891 Personal history of nicotine dependence: Secondary | ICD-10-CM | POA: Diagnosis not present

## 2022-06-26 DIAGNOSIS — I10 Essential (primary) hypertension: Secondary | ICD-10-CM | POA: Diagnosis not present

## 2022-06-26 DIAGNOSIS — E785 Hyperlipidemia, unspecified: Secondary | ICD-10-CM | POA: Diagnosis not present

## 2022-06-26 DIAGNOSIS — Z79899 Other long term (current) drug therapy: Secondary | ICD-10-CM | POA: Diagnosis not present

## 2022-06-26 DIAGNOSIS — Z7984 Long term (current) use of oral hypoglycemic drugs: Secondary | ICD-10-CM | POA: Diagnosis not present

## 2022-06-26 DIAGNOSIS — M109 Gout, unspecified: Secondary | ICD-10-CM | POA: Diagnosis not present

## 2022-06-26 LAB — RAD ONC ARIA SESSION SUMMARY
Course Elapsed Days: 6
Plan Fractions Treated to Date: 5
Plan Prescribed Dose Per Fraction: 2 Gy
Plan Total Fractions Prescribed: 40
Plan Total Prescribed Dose: 80 Gy
Reference Point Dosage Given to Date: 10 Gy
Reference Point Session Dosage Given: 2 Gy
Session Number: 5

## 2022-07-01 ENCOUNTER — Ambulatory Visit
Admission: RE | Admit: 2022-07-01 | Discharge: 2022-07-01 | Disposition: A | Discharge status: Home / Self Care | Payer: Medicare HMO | Source: Ambulatory Visit | Attending: Radiation Oncology | Admitting: Radiation Oncology

## 2022-07-01 ENCOUNTER — Other Ambulatory Visit: Payer: Self-pay

## 2022-07-01 DIAGNOSIS — Z7984 Long term (current) use of oral hypoglycemic drugs: Secondary | ICD-10-CM | POA: Diagnosis not present

## 2022-07-01 DIAGNOSIS — E119 Type 2 diabetes mellitus without complications: Secondary | ICD-10-CM | POA: Diagnosis not present

## 2022-07-01 DIAGNOSIS — Z510 Encounter for antineoplastic radiation therapy: Secondary | ICD-10-CM | POA: Diagnosis not present

## 2022-07-01 DIAGNOSIS — E785 Hyperlipidemia, unspecified: Secondary | ICD-10-CM | POA: Diagnosis not present

## 2022-07-01 DIAGNOSIS — Z79899 Other long term (current) drug therapy: Secondary | ICD-10-CM | POA: Diagnosis not present

## 2022-07-01 DIAGNOSIS — Z86010 Personal history of colonic polyps: Secondary | ICD-10-CM | POA: Diagnosis not present

## 2022-07-01 DIAGNOSIS — I10 Essential (primary) hypertension: Secondary | ICD-10-CM | POA: Diagnosis not present

## 2022-07-01 DIAGNOSIS — M109 Gout, unspecified: Secondary | ICD-10-CM | POA: Diagnosis not present

## 2022-07-01 DIAGNOSIS — Z87891 Personal history of nicotine dependence: Secondary | ICD-10-CM | POA: Diagnosis not present

## 2022-07-01 DIAGNOSIS — Z191 Hormone sensitive malignancy status: Secondary | ICD-10-CM | POA: Diagnosis not present

## 2022-07-01 DIAGNOSIS — C61 Malignant neoplasm of prostate: Secondary | ICD-10-CM | POA: Diagnosis not present

## 2022-07-01 LAB — RAD ONC ARIA SESSION SUMMARY
Course Elapsed Days: 11
Plan Fractions Treated to Date: 6
Plan Prescribed Dose Per Fraction: 2 Gy
Plan Total Fractions Prescribed: 40
Plan Total Prescribed Dose: 80 Gy
Reference Point Dosage Given to Date: 12 Gy
Reference Point Session Dosage Given: 2 Gy
Session Number: 6

## 2022-07-02 ENCOUNTER — Ambulatory Visit
Admission: RE | Admit: 2022-07-02 | Discharge: 2022-07-02 | Disposition: A | Discharge status: Home / Self Care | Payer: Medicare HMO | Source: Ambulatory Visit | Attending: Radiation Oncology | Admitting: Radiation Oncology

## 2022-07-02 ENCOUNTER — Other Ambulatory Visit: Payer: Self-pay

## 2022-07-02 DIAGNOSIS — C61 Malignant neoplasm of prostate: Secondary | ICD-10-CM | POA: Diagnosis not present

## 2022-07-02 DIAGNOSIS — E119 Type 2 diabetes mellitus without complications: Secondary | ICD-10-CM | POA: Diagnosis not present

## 2022-07-02 DIAGNOSIS — Z191 Hormone sensitive malignancy status: Secondary | ICD-10-CM | POA: Diagnosis not present

## 2022-07-02 DIAGNOSIS — Z86010 Personal history of colonic polyps: Secondary | ICD-10-CM | POA: Diagnosis not present

## 2022-07-02 DIAGNOSIS — M109 Gout, unspecified: Secondary | ICD-10-CM | POA: Diagnosis not present

## 2022-07-02 DIAGNOSIS — E785 Hyperlipidemia, unspecified: Secondary | ICD-10-CM | POA: Diagnosis not present

## 2022-07-02 DIAGNOSIS — Z7984 Long term (current) use of oral hypoglycemic drugs: Secondary | ICD-10-CM | POA: Diagnosis not present

## 2022-07-02 DIAGNOSIS — Z87891 Personal history of nicotine dependence: Secondary | ICD-10-CM | POA: Diagnosis not present

## 2022-07-02 DIAGNOSIS — Z510 Encounter for antineoplastic radiation therapy: Secondary | ICD-10-CM | POA: Diagnosis not present

## 2022-07-02 DIAGNOSIS — Z79899 Other long term (current) drug therapy: Secondary | ICD-10-CM | POA: Diagnosis not present

## 2022-07-02 DIAGNOSIS — I10 Essential (primary) hypertension: Secondary | ICD-10-CM | POA: Diagnosis not present

## 2022-07-02 LAB — RAD ONC ARIA SESSION SUMMARY
Course Elapsed Days: 12
Plan Fractions Treated to Date: 7
Plan Prescribed Dose Per Fraction: 2 Gy
Plan Total Fractions Prescribed: 40
Plan Total Prescribed Dose: 80 Gy
Reference Point Dosage Given to Date: 14 Gy
Reference Point Session Dosage Given: 2 Gy
Session Number: 7

## 2022-07-03 ENCOUNTER — Ambulatory Visit
Admission: RE | Admit: 2022-07-03 | Discharge: 2022-07-03 | Disposition: A | Discharge status: Home / Self Care | Payer: Medicare HMO | Source: Ambulatory Visit | Attending: Radiation Oncology | Admitting: Radiation Oncology

## 2022-07-03 ENCOUNTER — Other Ambulatory Visit: Payer: Self-pay

## 2022-07-03 DIAGNOSIS — E785 Hyperlipidemia, unspecified: Secondary | ICD-10-CM | POA: Diagnosis not present

## 2022-07-03 DIAGNOSIS — Z191 Hormone sensitive malignancy status: Secondary | ICD-10-CM | POA: Diagnosis not present

## 2022-07-03 DIAGNOSIS — Z86010 Personal history of colonic polyps: Secondary | ICD-10-CM | POA: Diagnosis not present

## 2022-07-03 DIAGNOSIS — M109 Gout, unspecified: Secondary | ICD-10-CM | POA: Diagnosis not present

## 2022-07-03 DIAGNOSIS — C61 Malignant neoplasm of prostate: Secondary | ICD-10-CM | POA: Diagnosis not present

## 2022-07-03 DIAGNOSIS — Z87891 Personal history of nicotine dependence: Secondary | ICD-10-CM | POA: Diagnosis not present

## 2022-07-03 DIAGNOSIS — Z79899 Other long term (current) drug therapy: Secondary | ICD-10-CM | POA: Diagnosis not present

## 2022-07-03 DIAGNOSIS — Z7984 Long term (current) use of oral hypoglycemic drugs: Secondary | ICD-10-CM | POA: Diagnosis not present

## 2022-07-03 DIAGNOSIS — Z510 Encounter for antineoplastic radiation therapy: Secondary | ICD-10-CM | POA: Diagnosis not present

## 2022-07-03 DIAGNOSIS — I10 Essential (primary) hypertension: Secondary | ICD-10-CM | POA: Diagnosis not present

## 2022-07-03 DIAGNOSIS — E119 Type 2 diabetes mellitus without complications: Secondary | ICD-10-CM | POA: Diagnosis not present

## 2022-07-03 LAB — RAD ONC ARIA SESSION SUMMARY
Course Elapsed Days: 13
Plan Fractions Treated to Date: 8
Plan Prescribed Dose Per Fraction: 2 Gy
Plan Total Fractions Prescribed: 40
Plan Total Prescribed Dose: 80 Gy
Reference Point Dosage Given to Date: 16 Gy
Reference Point Session Dosage Given: 2 Gy
Session Number: 8

## 2022-07-04 ENCOUNTER — Inpatient Hospital Stay: Payer: Medicare HMO | Attending: Radiation Oncology

## 2022-07-04 ENCOUNTER — Other Ambulatory Visit: Payer: Self-pay

## 2022-07-04 ENCOUNTER — Ambulatory Visit
Admission: RE | Admit: 2022-07-04 | Discharge: 2022-07-04 | Disposition: A | Discharge status: Home / Self Care | Payer: Medicare HMO | Source: Ambulatory Visit | Attending: Radiation Oncology | Admitting: Radiation Oncology

## 2022-07-04 DIAGNOSIS — Z510 Encounter for antineoplastic radiation therapy: Secondary | ICD-10-CM | POA: Insufficient documentation

## 2022-07-04 DIAGNOSIS — C61 Malignant neoplasm of prostate: Secondary | ICD-10-CM | POA: Insufficient documentation

## 2022-07-04 DIAGNOSIS — Z191 Hormone sensitive malignancy status: Secondary | ICD-10-CM | POA: Diagnosis not present

## 2022-07-04 LAB — RAD ONC ARIA SESSION SUMMARY
Course Elapsed Days: 14
Plan Fractions Treated to Date: 9
Plan Prescribed Dose Per Fraction: 2 Gy
Plan Total Fractions Prescribed: 40
Plan Total Prescribed Dose: 80 Gy
Reference Point Dosage Given to Date: 18 Gy
Reference Point Session Dosage Given: 2 Gy
Session Number: 9

## 2022-07-04 LAB — CBC
HCT: 40.1 % (ref 39.0–52.0)
Hemoglobin: 13.7 g/dL (ref 13.0–17.0)
MCH: 30.6 pg (ref 26.0–34.0)
MCHC: 34.2 g/dL (ref 30.0–36.0)
MCV: 89.7 fL (ref 80.0–100.0)
Platelets: 262 10*3/uL (ref 150–400)
RBC: 4.47 MIL/uL (ref 4.22–5.81)
RDW: 14 % (ref 11.5–15.5)
WBC: 6 10*3/uL (ref 4.0–10.5)
nRBC: 0 % (ref 0.0–0.2)

## 2022-07-05 ENCOUNTER — Other Ambulatory Visit: Payer: Self-pay

## 2022-07-05 ENCOUNTER — Ambulatory Visit
Admission: RE | Admit: 2022-07-05 | Discharge: 2022-07-05 | Disposition: A | Discharge status: Home / Self Care | Payer: Medicare HMO | Source: Ambulatory Visit | Attending: Radiation Oncology | Admitting: Radiation Oncology

## 2022-07-05 DIAGNOSIS — Z87891 Personal history of nicotine dependence: Secondary | ICD-10-CM | POA: Diagnosis not present

## 2022-07-05 DIAGNOSIS — M109 Gout, unspecified: Secondary | ICD-10-CM | POA: Insufficient documentation

## 2022-07-05 DIAGNOSIS — Z79899 Other long term (current) drug therapy: Secondary | ICD-10-CM | POA: Diagnosis not present

## 2022-07-05 DIAGNOSIS — E119 Type 2 diabetes mellitus without complications: Secondary | ICD-10-CM | POA: Insufficient documentation

## 2022-07-05 DIAGNOSIS — I10 Essential (primary) hypertension: Secondary | ICD-10-CM | POA: Insufficient documentation

## 2022-07-05 DIAGNOSIS — E785 Hyperlipidemia, unspecified: Secondary | ICD-10-CM | POA: Insufficient documentation

## 2022-07-05 DIAGNOSIS — Z803 Family history of malignant neoplasm of breast: Secondary | ICD-10-CM | POA: Insufficient documentation

## 2022-07-05 DIAGNOSIS — C61 Malignant neoplasm of prostate: Secondary | ICD-10-CM | POA: Diagnosis not present

## 2022-07-05 DIAGNOSIS — Z191 Hormone sensitive malignancy status: Secondary | ICD-10-CM | POA: Diagnosis not present

## 2022-07-05 DIAGNOSIS — Z86010 Personal history of colonic polyps: Secondary | ICD-10-CM | POA: Diagnosis not present

## 2022-07-05 DIAGNOSIS — Z7984 Long term (current) use of oral hypoglycemic drugs: Secondary | ICD-10-CM | POA: Diagnosis not present

## 2022-07-05 DIAGNOSIS — Z510 Encounter for antineoplastic radiation therapy: Secondary | ICD-10-CM | POA: Diagnosis not present

## 2022-07-05 LAB — RAD ONC ARIA SESSION SUMMARY
Course Elapsed Days: 15
Plan Fractions Treated to Date: 10
Plan Prescribed Dose Per Fraction: 2 Gy
Plan Total Fractions Prescribed: 40
Plan Total Prescribed Dose: 80 Gy
Reference Point Dosage Given to Date: 20 Gy
Reference Point Session Dosage Given: 2 Gy
Session Number: 10

## 2022-07-07 ENCOUNTER — Other Ambulatory Visit: Payer: Self-pay

## 2022-07-07 DIAGNOSIS — E1169 Type 2 diabetes mellitus with other specified complication: Secondary | ICD-10-CM

## 2022-07-07 MED ORDER — METFORMIN HCL 500 MG PO TABS: 1000.0000 mg | ORAL_TABLET | Freq: Two times a day (BID) | ORAL | 0 refills | Status: DC

## 2022-07-08 ENCOUNTER — Ambulatory Visit
Admission: RE | Admit: 2022-07-08 | Discharge: 2022-07-08 | Disposition: A | Discharge status: Home / Self Care | Payer: Medicare HMO | Source: Ambulatory Visit | Attending: Radiation Oncology | Admitting: Radiation Oncology

## 2022-07-08 ENCOUNTER — Other Ambulatory Visit: Payer: Self-pay

## 2022-07-08 DIAGNOSIS — Z191 Hormone sensitive malignancy status: Secondary | ICD-10-CM | POA: Diagnosis not present

## 2022-07-08 DIAGNOSIS — Z79899 Other long term (current) drug therapy: Secondary | ICD-10-CM | POA: Diagnosis not present

## 2022-07-08 DIAGNOSIS — I10 Essential (primary) hypertension: Secondary | ICD-10-CM | POA: Diagnosis not present

## 2022-07-08 DIAGNOSIS — Z87891 Personal history of nicotine dependence: Secondary | ICD-10-CM | POA: Diagnosis not present

## 2022-07-08 DIAGNOSIS — Z7984 Long term (current) use of oral hypoglycemic drugs: Secondary | ICD-10-CM | POA: Diagnosis not present

## 2022-07-08 DIAGNOSIS — E119 Type 2 diabetes mellitus without complications: Secondary | ICD-10-CM | POA: Diagnosis not present

## 2022-07-08 DIAGNOSIS — E785 Hyperlipidemia, unspecified: Secondary | ICD-10-CM | POA: Diagnosis not present

## 2022-07-08 DIAGNOSIS — C61 Malignant neoplasm of prostate: Secondary | ICD-10-CM | POA: Diagnosis not present

## 2022-07-08 DIAGNOSIS — M109 Gout, unspecified: Secondary | ICD-10-CM | POA: Diagnosis not present

## 2022-07-08 DIAGNOSIS — Z86010 Personal history of colonic polyps: Secondary | ICD-10-CM | POA: Diagnosis not present

## 2022-07-08 DIAGNOSIS — Z510 Encounter for antineoplastic radiation therapy: Secondary | ICD-10-CM | POA: Diagnosis not present

## 2022-07-08 LAB — RAD ONC ARIA SESSION SUMMARY
Course Elapsed Days: 18
Plan Fractions Treated to Date: 11
Plan Prescribed Dose Per Fraction: 2 Gy
Plan Total Fractions Prescribed: 40
Plan Total Prescribed Dose: 80 Gy
Reference Point Dosage Given to Date: 22 Gy
Reference Point Session Dosage Given: 2 Gy
Session Number: 11

## 2022-07-09 ENCOUNTER — Ambulatory Visit
Admission: RE | Admit: 2022-07-09 | Discharge: 2022-07-09 | Disposition: A | Discharge status: Home / Self Care | Payer: Medicare HMO | Source: Ambulatory Visit | Attending: Radiation Oncology | Admitting: Radiation Oncology

## 2022-07-09 ENCOUNTER — Other Ambulatory Visit: Payer: Self-pay

## 2022-07-09 DIAGNOSIS — M109 Gout, unspecified: Secondary | ICD-10-CM | POA: Diagnosis not present

## 2022-07-09 DIAGNOSIS — Z510 Encounter for antineoplastic radiation therapy: Secondary | ICD-10-CM | POA: Diagnosis not present

## 2022-07-09 DIAGNOSIS — C61 Malignant neoplasm of prostate: Secondary | ICD-10-CM | POA: Diagnosis not present

## 2022-07-09 DIAGNOSIS — Z87891 Personal history of nicotine dependence: Secondary | ICD-10-CM | POA: Diagnosis not present

## 2022-07-09 DIAGNOSIS — Z7984 Long term (current) use of oral hypoglycemic drugs: Secondary | ICD-10-CM | POA: Diagnosis not present

## 2022-07-09 DIAGNOSIS — E119 Type 2 diabetes mellitus without complications: Secondary | ICD-10-CM | POA: Diagnosis not present

## 2022-07-09 DIAGNOSIS — Z79899 Other long term (current) drug therapy: Secondary | ICD-10-CM | POA: Diagnosis not present

## 2022-07-09 DIAGNOSIS — E785 Hyperlipidemia, unspecified: Secondary | ICD-10-CM | POA: Diagnosis not present

## 2022-07-09 DIAGNOSIS — I10 Essential (primary) hypertension: Secondary | ICD-10-CM | POA: Diagnosis not present

## 2022-07-09 DIAGNOSIS — Z191 Hormone sensitive malignancy status: Secondary | ICD-10-CM | POA: Diagnosis not present

## 2022-07-09 DIAGNOSIS — Z86010 Personal history of colonic polyps: Secondary | ICD-10-CM | POA: Diagnosis not present

## 2022-07-09 LAB — RAD ONC ARIA SESSION SUMMARY
Course Elapsed Days: 19
Plan Fractions Treated to Date: 12
Plan Prescribed Dose Per Fraction: 2 Gy
Plan Total Fractions Prescribed: 40
Plan Total Prescribed Dose: 80 Gy
Reference Point Dosage Given to Date: 24 Gy
Reference Point Session Dosage Given: 2 Gy
Session Number: 12

## 2022-07-10 ENCOUNTER — Ambulatory Visit
Admission: RE | Admit: 2022-07-10 | Discharge: 2022-07-10 | Disposition: A | Discharge status: Home / Self Care | Payer: Medicare HMO | Source: Ambulatory Visit | Attending: Radiation Oncology | Admitting: Radiation Oncology

## 2022-07-10 ENCOUNTER — Ambulatory Visit: Payer: Medicare HMO | Admitting: Family Medicine

## 2022-07-10 ENCOUNTER — Other Ambulatory Visit: Payer: Self-pay

## 2022-07-10 DIAGNOSIS — Z191 Hormone sensitive malignancy status: Secondary | ICD-10-CM | POA: Diagnosis not present

## 2022-07-10 DIAGNOSIS — Z86010 Personal history of colonic polyps: Secondary | ICD-10-CM | POA: Diagnosis not present

## 2022-07-10 DIAGNOSIS — Z87891 Personal history of nicotine dependence: Secondary | ICD-10-CM | POA: Diagnosis not present

## 2022-07-10 DIAGNOSIS — Z79899 Other long term (current) drug therapy: Secondary | ICD-10-CM | POA: Diagnosis not present

## 2022-07-10 DIAGNOSIS — C61 Malignant neoplasm of prostate: Secondary | ICD-10-CM | POA: Diagnosis not present

## 2022-07-10 DIAGNOSIS — Z7984 Long term (current) use of oral hypoglycemic drugs: Secondary | ICD-10-CM | POA: Diagnosis not present

## 2022-07-10 DIAGNOSIS — I10 Essential (primary) hypertension: Secondary | ICD-10-CM | POA: Diagnosis not present

## 2022-07-10 DIAGNOSIS — M109 Gout, unspecified: Secondary | ICD-10-CM | POA: Diagnosis not present

## 2022-07-10 DIAGNOSIS — Z510 Encounter for antineoplastic radiation therapy: Secondary | ICD-10-CM | POA: Diagnosis not present

## 2022-07-10 DIAGNOSIS — E785 Hyperlipidemia, unspecified: Secondary | ICD-10-CM | POA: Diagnosis not present

## 2022-07-10 DIAGNOSIS — E119 Type 2 diabetes mellitus without complications: Secondary | ICD-10-CM | POA: Diagnosis not present

## 2022-07-10 LAB — RAD ONC ARIA SESSION SUMMARY
Course Elapsed Days: 20
Plan Fractions Treated to Date: 13
Plan Prescribed Dose Per Fraction: 2 Gy
Plan Total Fractions Prescribed: 40
Plan Total Prescribed Dose: 80 Gy
Reference Point Dosage Given to Date: 26 Gy
Reference Point Session Dosage Given: 2 Gy
Session Number: 13

## 2022-07-11 ENCOUNTER — Ambulatory Visit
Admission: RE | Admit: 2022-07-11 | Discharge: 2022-07-11 | Disposition: A | Discharge status: Home / Self Care | Payer: Medicare HMO | Source: Ambulatory Visit | Attending: Radiation Oncology | Admitting: Radiation Oncology

## 2022-07-11 ENCOUNTER — Other Ambulatory Visit: Payer: Self-pay

## 2022-07-11 DIAGNOSIS — C61 Malignant neoplasm of prostate: Secondary | ICD-10-CM | POA: Diagnosis not present

## 2022-07-11 DIAGNOSIS — Z510 Encounter for antineoplastic radiation therapy: Secondary | ICD-10-CM | POA: Diagnosis not present

## 2022-07-11 DIAGNOSIS — Z191 Hormone sensitive malignancy status: Secondary | ICD-10-CM | POA: Diagnosis not present

## 2022-07-11 DIAGNOSIS — E119 Type 2 diabetes mellitus without complications: Secondary | ICD-10-CM | POA: Diagnosis not present

## 2022-07-11 DIAGNOSIS — Z86010 Personal history of colonic polyps: Secondary | ICD-10-CM | POA: Diagnosis not present

## 2022-07-11 DIAGNOSIS — M109 Gout, unspecified: Secondary | ICD-10-CM | POA: Diagnosis not present

## 2022-07-11 DIAGNOSIS — Z7984 Long term (current) use of oral hypoglycemic drugs: Secondary | ICD-10-CM | POA: Diagnosis not present

## 2022-07-11 DIAGNOSIS — Z79899 Other long term (current) drug therapy: Secondary | ICD-10-CM | POA: Diagnosis not present

## 2022-07-11 DIAGNOSIS — Z87891 Personal history of nicotine dependence: Secondary | ICD-10-CM | POA: Diagnosis not present

## 2022-07-11 DIAGNOSIS — E785 Hyperlipidemia, unspecified: Secondary | ICD-10-CM | POA: Diagnosis not present

## 2022-07-11 DIAGNOSIS — I10 Essential (primary) hypertension: Secondary | ICD-10-CM | POA: Diagnosis not present

## 2022-07-11 LAB — RAD ONC ARIA SESSION SUMMARY
Course Elapsed Days: 21
Plan Fractions Treated to Date: 14
Plan Prescribed Dose Per Fraction: 2 Gy
Plan Total Fractions Prescribed: 40
Plan Total Prescribed Dose: 80 Gy
Reference Point Dosage Given to Date: 28 Gy
Reference Point Session Dosage Given: 2 Gy
Session Number: 14

## 2022-07-12 ENCOUNTER — Other Ambulatory Visit: Payer: Self-pay

## 2022-07-12 ENCOUNTER — Ambulatory Visit
Admission: RE | Admit: 2022-07-12 | Discharge: 2022-07-12 | Disposition: A | Discharge status: Home / Self Care | Payer: Medicare HMO | Source: Ambulatory Visit | Attending: Radiation Oncology | Admitting: Radiation Oncology

## 2022-07-12 DIAGNOSIS — E785 Hyperlipidemia, unspecified: Secondary | ICD-10-CM | POA: Diagnosis not present

## 2022-07-12 DIAGNOSIS — Z79899 Other long term (current) drug therapy: Secondary | ICD-10-CM | POA: Diagnosis not present

## 2022-07-12 DIAGNOSIS — Z7984 Long term (current) use of oral hypoglycemic drugs: Secondary | ICD-10-CM | POA: Diagnosis not present

## 2022-07-12 DIAGNOSIS — Z510 Encounter for antineoplastic radiation therapy: Secondary | ICD-10-CM | POA: Diagnosis not present

## 2022-07-12 DIAGNOSIS — Z86010 Personal history of colonic polyps: Secondary | ICD-10-CM | POA: Diagnosis not present

## 2022-07-12 DIAGNOSIS — E119 Type 2 diabetes mellitus without complications: Secondary | ICD-10-CM | POA: Diagnosis not present

## 2022-07-12 DIAGNOSIS — Z191 Hormone sensitive malignancy status: Secondary | ICD-10-CM | POA: Diagnosis not present

## 2022-07-12 DIAGNOSIS — M109 Gout, unspecified: Secondary | ICD-10-CM | POA: Diagnosis not present

## 2022-07-12 DIAGNOSIS — Z87891 Personal history of nicotine dependence: Secondary | ICD-10-CM | POA: Diagnosis not present

## 2022-07-12 DIAGNOSIS — I10 Essential (primary) hypertension: Secondary | ICD-10-CM | POA: Diagnosis not present

## 2022-07-12 DIAGNOSIS — C61 Malignant neoplasm of prostate: Secondary | ICD-10-CM | POA: Diagnosis not present

## 2022-07-12 LAB — RAD ONC ARIA SESSION SUMMARY
Course Elapsed Days: 22
Plan Fractions Treated to Date: 15
Plan Prescribed Dose Per Fraction: 2 Gy
Plan Total Fractions Prescribed: 40
Plan Total Prescribed Dose: 80 Gy
Reference Point Dosage Given to Date: 30 Gy
Reference Point Session Dosage Given: 2 Gy
Session Number: 15

## 2022-07-15 ENCOUNTER — Encounter: Payer: Medicare HMO | Admitting: Family

## 2022-07-15 ENCOUNTER — Ambulatory Visit
Admission: RE | Admit: 2022-07-15 | Discharge: 2022-07-15 | Disposition: A | Discharge status: Home / Self Care | Payer: Medicare HMO | Source: Ambulatory Visit | Attending: Radiation Oncology | Admitting: Radiation Oncology

## 2022-07-15 ENCOUNTER — Other Ambulatory Visit: Payer: Self-pay

## 2022-07-15 DIAGNOSIS — Z510 Encounter for antineoplastic radiation therapy: Secondary | ICD-10-CM | POA: Diagnosis not present

## 2022-07-15 DIAGNOSIS — Z87891 Personal history of nicotine dependence: Secondary | ICD-10-CM | POA: Diagnosis not present

## 2022-07-15 DIAGNOSIS — E119 Type 2 diabetes mellitus without complications: Secondary | ICD-10-CM | POA: Diagnosis not present

## 2022-07-15 DIAGNOSIS — Z79899 Other long term (current) drug therapy: Secondary | ICD-10-CM | POA: Diagnosis not present

## 2022-07-15 DIAGNOSIS — C61 Malignant neoplasm of prostate: Secondary | ICD-10-CM | POA: Diagnosis not present

## 2022-07-15 DIAGNOSIS — I10 Essential (primary) hypertension: Secondary | ICD-10-CM | POA: Diagnosis not present

## 2022-07-15 DIAGNOSIS — Z86010 Personal history of colonic polyps: Secondary | ICD-10-CM | POA: Diagnosis not present

## 2022-07-15 DIAGNOSIS — Z7984 Long term (current) use of oral hypoglycemic drugs: Secondary | ICD-10-CM | POA: Diagnosis not present

## 2022-07-15 DIAGNOSIS — Z191 Hormone sensitive malignancy status: Secondary | ICD-10-CM | POA: Diagnosis not present

## 2022-07-15 DIAGNOSIS — E785 Hyperlipidemia, unspecified: Secondary | ICD-10-CM | POA: Diagnosis not present

## 2022-07-15 DIAGNOSIS — M109 Gout, unspecified: Secondary | ICD-10-CM | POA: Diagnosis not present

## 2022-07-15 LAB — RAD ONC ARIA SESSION SUMMARY
Course Elapsed Days: 25
Plan Fractions Treated to Date: 16
Plan Prescribed Dose Per Fraction: 2 Gy
Plan Total Fractions Prescribed: 40
Plan Total Prescribed Dose: 80 Gy
Reference Point Dosage Given to Date: 32 Gy
Reference Point Session Dosage Given: 2 Gy
Session Number: 16

## 2022-07-16 ENCOUNTER — Other Ambulatory Visit: Payer: Self-pay

## 2022-07-16 ENCOUNTER — Ambulatory Visit
Admission: RE | Admit: 2022-07-16 | Discharge: 2022-07-16 | Disposition: A | Discharge status: Home / Self Care | Payer: Medicare HMO | Source: Ambulatory Visit | Attending: Radiation Oncology | Admitting: Radiation Oncology

## 2022-07-16 DIAGNOSIS — Z7984 Long term (current) use of oral hypoglycemic drugs: Secondary | ICD-10-CM | POA: Diagnosis not present

## 2022-07-16 DIAGNOSIS — Z86010 Personal history of colonic polyps: Secondary | ICD-10-CM | POA: Diagnosis not present

## 2022-07-16 DIAGNOSIS — Z510 Encounter for antineoplastic radiation therapy: Secondary | ICD-10-CM | POA: Diagnosis not present

## 2022-07-16 DIAGNOSIS — E119 Type 2 diabetes mellitus without complications: Secondary | ICD-10-CM | POA: Diagnosis not present

## 2022-07-16 DIAGNOSIS — C61 Malignant neoplasm of prostate: Secondary | ICD-10-CM | POA: Diagnosis not present

## 2022-07-16 DIAGNOSIS — Z79899 Other long term (current) drug therapy: Secondary | ICD-10-CM | POA: Diagnosis not present

## 2022-07-16 DIAGNOSIS — M109 Gout, unspecified: Secondary | ICD-10-CM | POA: Diagnosis not present

## 2022-07-16 DIAGNOSIS — E785 Hyperlipidemia, unspecified: Secondary | ICD-10-CM | POA: Diagnosis not present

## 2022-07-16 DIAGNOSIS — I10 Essential (primary) hypertension: Secondary | ICD-10-CM | POA: Diagnosis not present

## 2022-07-16 DIAGNOSIS — Z191 Hormone sensitive malignancy status: Secondary | ICD-10-CM | POA: Diagnosis not present

## 2022-07-16 DIAGNOSIS — Z87891 Personal history of nicotine dependence: Secondary | ICD-10-CM | POA: Diagnosis not present

## 2022-07-16 LAB — RAD ONC ARIA SESSION SUMMARY
Course Elapsed Days: 26
Plan Fractions Treated to Date: 17
Plan Prescribed Dose Per Fraction: 2 Gy
Plan Total Fractions Prescribed: 40
Plan Total Prescribed Dose: 80 Gy
Reference Point Dosage Given to Date: 34 Gy
Reference Point Session Dosage Given: 2 Gy
Session Number: 17

## 2022-07-17 ENCOUNTER — Other Ambulatory Visit: Payer: Self-pay

## 2022-07-17 ENCOUNTER — Ambulatory Visit
Admission: RE | Admit: 2022-07-17 | Discharge: 2022-07-17 | Disposition: A | Discharge status: Home / Self Care | Payer: Medicare HMO | Source: Ambulatory Visit | Attending: Radiation Oncology | Admitting: Radiation Oncology

## 2022-07-17 DIAGNOSIS — E119 Type 2 diabetes mellitus without complications: Secondary | ICD-10-CM | POA: Diagnosis not present

## 2022-07-17 DIAGNOSIS — I10 Essential (primary) hypertension: Secondary | ICD-10-CM | POA: Diagnosis not present

## 2022-07-17 DIAGNOSIS — M109 Gout, unspecified: Secondary | ICD-10-CM | POA: Diagnosis not present

## 2022-07-17 DIAGNOSIS — C61 Malignant neoplasm of prostate: Secondary | ICD-10-CM | POA: Diagnosis not present

## 2022-07-17 DIAGNOSIS — Z79899 Other long term (current) drug therapy: Secondary | ICD-10-CM | POA: Diagnosis not present

## 2022-07-17 DIAGNOSIS — Z86010 Personal history of colonic polyps: Secondary | ICD-10-CM | POA: Diagnosis not present

## 2022-07-17 DIAGNOSIS — Z7984 Long term (current) use of oral hypoglycemic drugs: Secondary | ICD-10-CM | POA: Diagnosis not present

## 2022-07-17 DIAGNOSIS — E785 Hyperlipidemia, unspecified: Secondary | ICD-10-CM | POA: Diagnosis not present

## 2022-07-17 DIAGNOSIS — Z87891 Personal history of nicotine dependence: Secondary | ICD-10-CM | POA: Diagnosis not present

## 2022-07-17 DIAGNOSIS — Z191 Hormone sensitive malignancy status: Secondary | ICD-10-CM | POA: Diagnosis not present

## 2022-07-17 DIAGNOSIS — Z510 Encounter for antineoplastic radiation therapy: Secondary | ICD-10-CM | POA: Diagnosis not present

## 2022-07-17 LAB — RAD ONC ARIA SESSION SUMMARY
Course Elapsed Days: 27
Plan Fractions Treated to Date: 18
Plan Prescribed Dose Per Fraction: 2 Gy
Plan Total Fractions Prescribed: 40
Plan Total Prescribed Dose: 80 Gy
Reference Point Dosage Given to Date: 36 Gy
Reference Point Session Dosage Given: 2 Gy
Session Number: 18

## 2022-07-18 ENCOUNTER — Inpatient Hospital Stay: Payer: Medicare HMO

## 2022-07-18 ENCOUNTER — Ambulatory Visit
Admission: RE | Admit: 2022-07-18 | Discharge: 2022-07-18 | Disposition: A | Discharge status: Home / Self Care | Payer: Medicare HMO | Source: Ambulatory Visit | Attending: Radiation Oncology | Admitting: Radiation Oncology

## 2022-07-18 ENCOUNTER — Other Ambulatory Visit: Payer: Self-pay

## 2022-07-18 DIAGNOSIS — Z191 Hormone sensitive malignancy status: Secondary | ICD-10-CM | POA: Diagnosis not present

## 2022-07-18 DIAGNOSIS — E119 Type 2 diabetes mellitus without complications: Secondary | ICD-10-CM | POA: Diagnosis not present

## 2022-07-18 DIAGNOSIS — C61 Malignant neoplasm of prostate: Secondary | ICD-10-CM

## 2022-07-18 DIAGNOSIS — Z7984 Long term (current) use of oral hypoglycemic drugs: Secondary | ICD-10-CM | POA: Diagnosis not present

## 2022-07-18 DIAGNOSIS — Z86010 Personal history of colonic polyps: Secondary | ICD-10-CM | POA: Diagnosis not present

## 2022-07-18 DIAGNOSIS — M109 Gout, unspecified: Secondary | ICD-10-CM | POA: Diagnosis not present

## 2022-07-18 DIAGNOSIS — Z510 Encounter for antineoplastic radiation therapy: Secondary | ICD-10-CM | POA: Diagnosis not present

## 2022-07-18 DIAGNOSIS — I10 Essential (primary) hypertension: Secondary | ICD-10-CM | POA: Diagnosis not present

## 2022-07-18 DIAGNOSIS — E785 Hyperlipidemia, unspecified: Secondary | ICD-10-CM | POA: Diagnosis not present

## 2022-07-18 DIAGNOSIS — Z79899 Other long term (current) drug therapy: Secondary | ICD-10-CM | POA: Diagnosis not present

## 2022-07-18 DIAGNOSIS — Z87891 Personal history of nicotine dependence: Secondary | ICD-10-CM | POA: Diagnosis not present

## 2022-07-18 LAB — CBC
HCT: 38.8 % — ABNORMAL LOW (ref 39.0–52.0)
Hemoglobin: 13.4 g/dL (ref 13.0–17.0)
MCH: 30.9 pg (ref 26.0–34.0)
MCHC: 34.5 g/dL (ref 30.0–36.0)
MCV: 89.6 fL (ref 80.0–100.0)
Platelets: 213 10*3/uL (ref 150–400)
RBC: 4.33 MIL/uL (ref 4.22–5.81)
RDW: 13.9 % (ref 11.5–15.5)
WBC: 4.4 10*3/uL (ref 4.0–10.5)
nRBC: 0 % (ref 0.0–0.2)

## 2022-07-18 LAB — RAD ONC ARIA SESSION SUMMARY
Course Elapsed Days: 28
Plan Fractions Treated to Date: 19
Plan Prescribed Dose Per Fraction: 2 Gy
Plan Total Fractions Prescribed: 40
Plan Total Prescribed Dose: 80 Gy
Reference Point Dosage Given to Date: 38 Gy
Reference Point Session Dosage Given: 2 Gy
Session Number: 19

## 2022-07-19 ENCOUNTER — Other Ambulatory Visit: Payer: Self-pay

## 2022-07-19 ENCOUNTER — Ambulatory Visit
Admission: RE | Admit: 2022-07-19 | Discharge: 2022-07-19 | Disposition: A | Discharge status: Home / Self Care | Payer: Medicare HMO | Source: Ambulatory Visit | Attending: Radiation Oncology | Admitting: Radiation Oncology

## 2022-07-19 DIAGNOSIS — Z7984 Long term (current) use of oral hypoglycemic drugs: Secondary | ICD-10-CM | POA: Diagnosis not present

## 2022-07-19 DIAGNOSIS — E785 Hyperlipidemia, unspecified: Secondary | ICD-10-CM | POA: Diagnosis not present

## 2022-07-19 DIAGNOSIS — E119 Type 2 diabetes mellitus without complications: Secondary | ICD-10-CM | POA: Diagnosis not present

## 2022-07-19 DIAGNOSIS — Z87891 Personal history of nicotine dependence: Secondary | ICD-10-CM | POA: Diagnosis not present

## 2022-07-19 DIAGNOSIS — Z79899 Other long term (current) drug therapy: Secondary | ICD-10-CM | POA: Diagnosis not present

## 2022-07-19 DIAGNOSIS — C61 Malignant neoplasm of prostate: Secondary | ICD-10-CM | POA: Diagnosis not present

## 2022-07-19 DIAGNOSIS — Z86010 Personal history of colonic polyps: Secondary | ICD-10-CM | POA: Diagnosis not present

## 2022-07-19 DIAGNOSIS — Z510 Encounter for antineoplastic radiation therapy: Secondary | ICD-10-CM | POA: Diagnosis not present

## 2022-07-19 DIAGNOSIS — M109 Gout, unspecified: Secondary | ICD-10-CM | POA: Diagnosis not present

## 2022-07-19 DIAGNOSIS — Z191 Hormone sensitive malignancy status: Secondary | ICD-10-CM | POA: Diagnosis not present

## 2022-07-19 DIAGNOSIS — I10 Essential (primary) hypertension: Secondary | ICD-10-CM | POA: Diagnosis not present

## 2022-07-19 LAB — RAD ONC ARIA SESSION SUMMARY
Course Elapsed Days: 29
Plan Fractions Treated to Date: 20
Plan Prescribed Dose Per Fraction: 2 Gy
Plan Total Fractions Prescribed: 40
Plan Total Prescribed Dose: 80 Gy
Reference Point Dosage Given to Date: 40 Gy
Reference Point Session Dosage Given: 2 Gy
Session Number: 20

## 2022-07-22 ENCOUNTER — Other Ambulatory Visit: Payer: Self-pay

## 2022-07-22 ENCOUNTER — Ambulatory Visit
Admission: RE | Admit: 2022-07-22 | Discharge: 2022-07-22 | Disposition: A | Discharge status: Home / Self Care | Payer: Medicare HMO | Source: Ambulatory Visit | Attending: Radiation Oncology | Admitting: Radiation Oncology

## 2022-07-22 DIAGNOSIS — Z7984 Long term (current) use of oral hypoglycemic drugs: Secondary | ICD-10-CM | POA: Diagnosis not present

## 2022-07-22 DIAGNOSIS — C61 Malignant neoplasm of prostate: Secondary | ICD-10-CM | POA: Diagnosis not present

## 2022-07-22 DIAGNOSIS — E119 Type 2 diabetes mellitus without complications: Secondary | ICD-10-CM | POA: Diagnosis not present

## 2022-07-22 DIAGNOSIS — M109 Gout, unspecified: Secondary | ICD-10-CM | POA: Diagnosis not present

## 2022-07-22 DIAGNOSIS — Z191 Hormone sensitive malignancy status: Secondary | ICD-10-CM | POA: Diagnosis not present

## 2022-07-22 DIAGNOSIS — Z79899 Other long term (current) drug therapy: Secondary | ICD-10-CM | POA: Diagnosis not present

## 2022-07-22 DIAGNOSIS — Z87891 Personal history of nicotine dependence: Secondary | ICD-10-CM | POA: Diagnosis not present

## 2022-07-22 DIAGNOSIS — E785 Hyperlipidemia, unspecified: Secondary | ICD-10-CM | POA: Diagnosis not present

## 2022-07-22 DIAGNOSIS — Z86010 Personal history of colonic polyps: Secondary | ICD-10-CM | POA: Diagnosis not present

## 2022-07-22 DIAGNOSIS — I10 Essential (primary) hypertension: Secondary | ICD-10-CM | POA: Diagnosis not present

## 2022-07-22 DIAGNOSIS — Z510 Encounter for antineoplastic radiation therapy: Secondary | ICD-10-CM | POA: Diagnosis not present

## 2022-07-22 LAB — RAD ONC ARIA SESSION SUMMARY
Course Elapsed Days: 32
Plan Fractions Treated to Date: 21
Plan Prescribed Dose Per Fraction: 2 Gy
Plan Total Fractions Prescribed: 40
Plan Total Prescribed Dose: 80 Gy
Reference Point Dosage Given to Date: 42 Gy
Reference Point Session Dosage Given: 2 Gy
Session Number: 21

## 2022-07-23 ENCOUNTER — Other Ambulatory Visit: Payer: Self-pay

## 2022-07-23 ENCOUNTER — Ambulatory Visit
Admission: RE | Admit: 2022-07-23 | Discharge: 2022-07-23 | Disposition: A | Discharge status: Home / Self Care | Payer: Medicare HMO | Source: Ambulatory Visit | Attending: Radiation Oncology | Admitting: Radiation Oncology

## 2022-07-23 DIAGNOSIS — E119 Type 2 diabetes mellitus without complications: Secondary | ICD-10-CM | POA: Diagnosis not present

## 2022-07-23 DIAGNOSIS — Z7984 Long term (current) use of oral hypoglycemic drugs: Secondary | ICD-10-CM | POA: Diagnosis not present

## 2022-07-23 DIAGNOSIS — Z79899 Other long term (current) drug therapy: Secondary | ICD-10-CM | POA: Diagnosis not present

## 2022-07-23 DIAGNOSIS — Z191 Hormone sensitive malignancy status: Secondary | ICD-10-CM | POA: Diagnosis not present

## 2022-07-23 DIAGNOSIS — M109 Gout, unspecified: Secondary | ICD-10-CM | POA: Diagnosis not present

## 2022-07-23 DIAGNOSIS — E785 Hyperlipidemia, unspecified: Secondary | ICD-10-CM | POA: Diagnosis not present

## 2022-07-23 DIAGNOSIS — C61 Malignant neoplasm of prostate: Secondary | ICD-10-CM | POA: Diagnosis not present

## 2022-07-23 DIAGNOSIS — Z86010 Personal history of colonic polyps: Secondary | ICD-10-CM | POA: Diagnosis not present

## 2022-07-23 DIAGNOSIS — I10 Essential (primary) hypertension: Secondary | ICD-10-CM | POA: Diagnosis not present

## 2022-07-23 DIAGNOSIS — Z510 Encounter for antineoplastic radiation therapy: Secondary | ICD-10-CM | POA: Diagnosis not present

## 2022-07-23 DIAGNOSIS — Z87891 Personal history of nicotine dependence: Secondary | ICD-10-CM | POA: Diagnosis not present

## 2022-07-23 LAB — RAD ONC ARIA SESSION SUMMARY
Course Elapsed Days: 33
Plan Fractions Treated to Date: 22
Plan Prescribed Dose Per Fraction: 2 Gy
Plan Total Fractions Prescribed: 40
Plan Total Prescribed Dose: 80 Gy
Reference Point Dosage Given to Date: 44 Gy
Reference Point Session Dosage Given: 2 Gy
Session Number: 22

## 2022-07-24 ENCOUNTER — Ambulatory Visit
Admission: RE | Admit: 2022-07-24 | Discharge: 2022-07-24 | Disposition: A | Discharge status: Home / Self Care | Payer: Medicare HMO | Source: Ambulatory Visit | Attending: Radiation Oncology | Admitting: Radiation Oncology

## 2022-07-24 ENCOUNTER — Other Ambulatory Visit: Payer: Self-pay

## 2022-07-24 DIAGNOSIS — Z86010 Personal history of colonic polyps: Secondary | ICD-10-CM | POA: Diagnosis not present

## 2022-07-24 DIAGNOSIS — Z510 Encounter for antineoplastic radiation therapy: Secondary | ICD-10-CM | POA: Diagnosis not present

## 2022-07-24 DIAGNOSIS — E785 Hyperlipidemia, unspecified: Secondary | ICD-10-CM | POA: Diagnosis not present

## 2022-07-24 DIAGNOSIS — I10 Essential (primary) hypertension: Secondary | ICD-10-CM | POA: Diagnosis not present

## 2022-07-24 DIAGNOSIS — Z191 Hormone sensitive malignancy status: Secondary | ICD-10-CM | POA: Diagnosis not present

## 2022-07-24 DIAGNOSIS — E119 Type 2 diabetes mellitus without complications: Secondary | ICD-10-CM | POA: Diagnosis not present

## 2022-07-24 DIAGNOSIS — C61 Malignant neoplasm of prostate: Secondary | ICD-10-CM | POA: Diagnosis not present

## 2022-07-24 DIAGNOSIS — Z7984 Long term (current) use of oral hypoglycemic drugs: Secondary | ICD-10-CM | POA: Diagnosis not present

## 2022-07-24 DIAGNOSIS — M109 Gout, unspecified: Secondary | ICD-10-CM | POA: Diagnosis not present

## 2022-07-24 DIAGNOSIS — Z87891 Personal history of nicotine dependence: Secondary | ICD-10-CM | POA: Diagnosis not present

## 2022-07-24 DIAGNOSIS — Z79899 Other long term (current) drug therapy: Secondary | ICD-10-CM | POA: Diagnosis not present

## 2022-07-24 LAB — RAD ONC ARIA SESSION SUMMARY
Course Elapsed Days: 34
Plan Fractions Treated to Date: 23
Plan Prescribed Dose Per Fraction: 2 Gy
Plan Total Fractions Prescribed: 40
Plan Total Prescribed Dose: 80 Gy
Reference Point Dosage Given to Date: 46 Gy
Reference Point Session Dosage Given: 2 Gy
Session Number: 23

## 2022-07-25 ENCOUNTER — Encounter: Payer: Self-pay | Admitting: Podiatry

## 2022-07-25 ENCOUNTER — Ambulatory Visit
Admission: RE | Admit: 2022-07-25 | Discharge: 2022-07-25 | Disposition: A | Discharge status: Home / Self Care | Payer: Medicare HMO | Source: Ambulatory Visit | Attending: Radiation Oncology | Admitting: Radiation Oncology

## 2022-07-25 ENCOUNTER — Ambulatory Visit (INDEPENDENT_AMBULATORY_CARE_PROVIDER_SITE_OTHER): Payer: Medicare HMO | Admitting: Podiatry

## 2022-07-25 ENCOUNTER — Other Ambulatory Visit: Payer: Self-pay

## 2022-07-25 DIAGNOSIS — Z510 Encounter for antineoplastic radiation therapy: Secondary | ICD-10-CM | POA: Diagnosis not present

## 2022-07-25 DIAGNOSIS — E119 Type 2 diabetes mellitus without complications: Secondary | ICD-10-CM | POA: Diagnosis not present

## 2022-07-25 DIAGNOSIS — N182 Chronic kidney disease, stage 2 (mild): Secondary | ICD-10-CM

## 2022-07-25 DIAGNOSIS — B351 Tinea unguium: Secondary | ICD-10-CM

## 2022-07-25 DIAGNOSIS — M79675 Pain in left toe(s): Secondary | ICD-10-CM | POA: Diagnosis not present

## 2022-07-25 DIAGNOSIS — Z79899 Other long term (current) drug therapy: Secondary | ICD-10-CM | POA: Diagnosis not present

## 2022-07-25 DIAGNOSIS — E1122 Type 2 diabetes mellitus with diabetic chronic kidney disease: Secondary | ICD-10-CM

## 2022-07-25 DIAGNOSIS — C61 Malignant neoplasm of prostate: Secondary | ICD-10-CM | POA: Diagnosis not present

## 2022-07-25 DIAGNOSIS — M79674 Pain in right toe(s): Secondary | ICD-10-CM

## 2022-07-25 DIAGNOSIS — M109 Gout, unspecified: Secondary | ICD-10-CM | POA: Diagnosis not present

## 2022-07-25 DIAGNOSIS — Z86010 Personal history of colonic polyps: Secondary | ICD-10-CM | POA: Diagnosis not present

## 2022-07-25 DIAGNOSIS — Z87891 Personal history of nicotine dependence: Secondary | ICD-10-CM | POA: Diagnosis not present

## 2022-07-25 DIAGNOSIS — Z7984 Long term (current) use of oral hypoglycemic drugs: Secondary | ICD-10-CM | POA: Diagnosis not present

## 2022-07-25 DIAGNOSIS — I10 Essential (primary) hypertension: Secondary | ICD-10-CM | POA: Diagnosis not present

## 2022-07-25 DIAGNOSIS — E785 Hyperlipidemia, unspecified: Secondary | ICD-10-CM | POA: Diagnosis not present

## 2022-07-25 DIAGNOSIS — Z191 Hormone sensitive malignancy status: Secondary | ICD-10-CM | POA: Diagnosis not present

## 2022-07-25 LAB — RAD ONC ARIA SESSION SUMMARY
Course Elapsed Days: 35
Plan Fractions Treated to Date: 24
Plan Prescribed Dose Per Fraction: 2 Gy
Plan Total Fractions Prescribed: 40
Plan Total Prescribed Dose: 80 Gy
Reference Point Dosage Given to Date: 48 Gy
Reference Point Session Dosage Given: 2 Gy
Session Number: 24

## 2022-07-25 NOTE — Progress Notes (Signed)
This patient returns to my office for at risk foot care.  This patient requires this care by a professional since this patient will be at risk due to having diabetes according to patient.  This patient is unable to cut nails himself since the patient cannot reach his nails.These nails are painful walking and wearing shoes.  This patient presents for at risk foot care today.  General Appearance  Alert, conversant and in no acute stress.  Vascular  Dorsalis pedis and posterior tibial  pulses are weakly  palpable  bilaterally.  Capillary return is within normal limits  bilaterally. Temperature is within normal limits  bilaterally.  Neurologic  Senn-Weinstein monofilament wire test within normal limits  bilaterally. Muscle power within normal limits bilaterally.  Nails Thick disfigured discolored nails with subungual debris  from hallux to fifth toes bilaterally. No evidence of bacterial infection or drainage bilaterally.  Orthopedic  No limitations of motion  feet .  No crepitus or effusions noted.  No bony pathology or digital deformities noted.  Skin  normotropic skin with no porokeratosis noted bilaterally.  No signs of infections or ulcers noted.     Onychomycosis  Pain in right toes  Pain in left toes  Consent was obtained for treatment procedures.   Mechanical debridement of nails 1-5  bilaterally performed with a nail nipper.  Filed with dremel without incident.    Return office visit    3 months                  Told patient to return for periodic foot care and evaluation due to potential at risk complications.   Gardiner Barefoot DPM

## 2022-07-26 ENCOUNTER — Other Ambulatory Visit: Payer: Self-pay

## 2022-07-26 ENCOUNTER — Ambulatory Visit
Admission: RE | Admit: 2022-07-26 | Discharge: 2022-07-26 | Disposition: A | Discharge status: Home / Self Care | Payer: Medicare HMO | Source: Ambulatory Visit | Attending: Radiation Oncology | Admitting: Radiation Oncology

## 2022-07-26 DIAGNOSIS — I10 Essential (primary) hypertension: Secondary | ICD-10-CM | POA: Diagnosis not present

## 2022-07-26 DIAGNOSIS — Z79899 Other long term (current) drug therapy: Secondary | ICD-10-CM | POA: Diagnosis not present

## 2022-07-26 DIAGNOSIS — Z510 Encounter for antineoplastic radiation therapy: Secondary | ICD-10-CM | POA: Diagnosis not present

## 2022-07-26 DIAGNOSIS — E119 Type 2 diabetes mellitus without complications: Secondary | ICD-10-CM | POA: Diagnosis not present

## 2022-07-26 DIAGNOSIS — Z7984 Long term (current) use of oral hypoglycemic drugs: Secondary | ICD-10-CM | POA: Diagnosis not present

## 2022-07-26 DIAGNOSIS — C61 Malignant neoplasm of prostate: Secondary | ICD-10-CM | POA: Diagnosis not present

## 2022-07-26 DIAGNOSIS — Z86010 Personal history of colonic polyps: Secondary | ICD-10-CM | POA: Diagnosis not present

## 2022-07-26 DIAGNOSIS — M109 Gout, unspecified: Secondary | ICD-10-CM | POA: Diagnosis not present

## 2022-07-26 DIAGNOSIS — Z87891 Personal history of nicotine dependence: Secondary | ICD-10-CM | POA: Diagnosis not present

## 2022-07-26 DIAGNOSIS — Z191 Hormone sensitive malignancy status: Secondary | ICD-10-CM | POA: Diagnosis not present

## 2022-07-26 DIAGNOSIS — E785 Hyperlipidemia, unspecified: Secondary | ICD-10-CM | POA: Diagnosis not present

## 2022-07-26 LAB — RAD ONC ARIA SESSION SUMMARY
Course Elapsed Days: 36
Plan Fractions Treated to Date: 25
Plan Prescribed Dose Per Fraction: 2 Gy
Plan Total Fractions Prescribed: 40
Plan Total Prescribed Dose: 80 Gy
Reference Point Dosage Given to Date: 50 Gy
Reference Point Session Dosage Given: 2 Gy
Session Number: 25

## 2022-07-30 ENCOUNTER — Other Ambulatory Visit: Payer: Self-pay

## 2022-07-30 ENCOUNTER — Ambulatory Visit
Admission: RE | Admit: 2022-07-30 | Discharge: 2022-07-30 | Disposition: A | Discharge status: Home / Self Care | Payer: Medicare HMO | Source: Ambulatory Visit | Attending: Radiation Oncology | Admitting: Radiation Oncology

## 2022-07-30 DIAGNOSIS — E119 Type 2 diabetes mellitus without complications: Secondary | ICD-10-CM | POA: Diagnosis not present

## 2022-07-30 DIAGNOSIS — I10 Essential (primary) hypertension: Secondary | ICD-10-CM | POA: Diagnosis not present

## 2022-07-30 DIAGNOSIS — M109 Gout, unspecified: Secondary | ICD-10-CM | POA: Diagnosis not present

## 2022-07-30 DIAGNOSIS — Z191 Hormone sensitive malignancy status: Secondary | ICD-10-CM | POA: Diagnosis not present

## 2022-07-30 DIAGNOSIS — Z79899 Other long term (current) drug therapy: Secondary | ICD-10-CM | POA: Diagnosis not present

## 2022-07-30 DIAGNOSIS — E785 Hyperlipidemia, unspecified: Secondary | ICD-10-CM | POA: Diagnosis not present

## 2022-07-30 DIAGNOSIS — C61 Malignant neoplasm of prostate: Secondary | ICD-10-CM | POA: Diagnosis not present

## 2022-07-30 DIAGNOSIS — Z510 Encounter for antineoplastic radiation therapy: Secondary | ICD-10-CM | POA: Diagnosis not present

## 2022-07-30 DIAGNOSIS — Z87891 Personal history of nicotine dependence: Secondary | ICD-10-CM | POA: Diagnosis not present

## 2022-07-30 DIAGNOSIS — Z86010 Personal history of colonic polyps: Secondary | ICD-10-CM | POA: Diagnosis not present

## 2022-07-30 DIAGNOSIS — Z7984 Long term (current) use of oral hypoglycemic drugs: Secondary | ICD-10-CM | POA: Diagnosis not present

## 2022-07-30 LAB — RAD ONC ARIA SESSION SUMMARY
Course Elapsed Days: 40
Plan Fractions Treated to Date: 26
Plan Prescribed Dose Per Fraction: 2 Gy
Plan Total Fractions Prescribed: 40
Plan Total Prescribed Dose: 80 Gy
Reference Point Dosage Given to Date: 52 Gy
Reference Point Session Dosage Given: 2 Gy
Session Number: 26

## 2022-07-31 ENCOUNTER — Other Ambulatory Visit: Payer: Self-pay

## 2022-07-31 ENCOUNTER — Ambulatory Visit
Admission: RE | Admit: 2022-07-31 | Discharge: 2022-07-31 | Disposition: A | Discharge status: Home / Self Care | Payer: Medicare HMO | Source: Ambulatory Visit | Attending: Radiation Oncology | Admitting: Radiation Oncology

## 2022-07-31 DIAGNOSIS — Z79899 Other long term (current) drug therapy: Secondary | ICD-10-CM | POA: Diagnosis not present

## 2022-07-31 DIAGNOSIS — Z191 Hormone sensitive malignancy status: Secondary | ICD-10-CM | POA: Diagnosis not present

## 2022-07-31 DIAGNOSIS — Z87891 Personal history of nicotine dependence: Secondary | ICD-10-CM | POA: Diagnosis not present

## 2022-07-31 DIAGNOSIS — Z7984 Long term (current) use of oral hypoglycemic drugs: Secondary | ICD-10-CM | POA: Diagnosis not present

## 2022-07-31 DIAGNOSIS — M109 Gout, unspecified: Secondary | ICD-10-CM | POA: Diagnosis not present

## 2022-07-31 DIAGNOSIS — E119 Type 2 diabetes mellitus without complications: Secondary | ICD-10-CM | POA: Diagnosis not present

## 2022-07-31 DIAGNOSIS — Z86010 Personal history of colonic polyps: Secondary | ICD-10-CM | POA: Diagnosis not present

## 2022-07-31 DIAGNOSIS — C61 Malignant neoplasm of prostate: Secondary | ICD-10-CM | POA: Diagnosis not present

## 2022-07-31 DIAGNOSIS — Z510 Encounter for antineoplastic radiation therapy: Secondary | ICD-10-CM | POA: Diagnosis not present

## 2022-07-31 DIAGNOSIS — I10 Essential (primary) hypertension: Secondary | ICD-10-CM | POA: Diagnosis not present

## 2022-07-31 DIAGNOSIS — E785 Hyperlipidemia, unspecified: Secondary | ICD-10-CM | POA: Diagnosis not present

## 2022-07-31 LAB — RAD ONC ARIA SESSION SUMMARY
Course Elapsed Days: 41
Plan Fractions Treated to Date: 27
Plan Prescribed Dose Per Fraction: 2 Gy
Plan Total Fractions Prescribed: 40
Plan Total Prescribed Dose: 80 Gy
Reference Point Dosage Given to Date: 54 Gy
Reference Point Session Dosage Given: 2 Gy
Session Number: 27

## 2022-08-01 ENCOUNTER — Other Ambulatory Visit: Payer: Self-pay

## 2022-08-01 ENCOUNTER — Ambulatory Visit
Admission: RE | Admit: 2022-08-01 | Discharge: 2022-08-01 | Disposition: A | Discharge status: Home / Self Care | Payer: Medicare HMO | Source: Ambulatory Visit | Attending: Radiation Oncology | Admitting: Radiation Oncology

## 2022-08-01 ENCOUNTER — Inpatient Hospital Stay: Payer: Medicare HMO

## 2022-08-01 DIAGNOSIS — C61 Malignant neoplasm of prostate: Secondary | ICD-10-CM

## 2022-08-01 DIAGNOSIS — E119 Type 2 diabetes mellitus without complications: Secondary | ICD-10-CM | POA: Diagnosis not present

## 2022-08-01 DIAGNOSIS — Z7984 Long term (current) use of oral hypoglycemic drugs: Secondary | ICD-10-CM | POA: Diagnosis not present

## 2022-08-01 DIAGNOSIS — E785 Hyperlipidemia, unspecified: Secondary | ICD-10-CM | POA: Diagnosis not present

## 2022-08-01 DIAGNOSIS — Z79899 Other long term (current) drug therapy: Secondary | ICD-10-CM | POA: Diagnosis not present

## 2022-08-01 DIAGNOSIS — Z87891 Personal history of nicotine dependence: Secondary | ICD-10-CM | POA: Diagnosis not present

## 2022-08-01 DIAGNOSIS — M109 Gout, unspecified: Secondary | ICD-10-CM | POA: Diagnosis not present

## 2022-08-01 DIAGNOSIS — Z510 Encounter for antineoplastic radiation therapy: Secondary | ICD-10-CM | POA: Diagnosis not present

## 2022-08-01 DIAGNOSIS — I10 Essential (primary) hypertension: Secondary | ICD-10-CM | POA: Diagnosis not present

## 2022-08-01 DIAGNOSIS — Z191 Hormone sensitive malignancy status: Secondary | ICD-10-CM | POA: Diagnosis not present

## 2022-08-01 DIAGNOSIS — Z86010 Personal history of colonic polyps: Secondary | ICD-10-CM | POA: Diagnosis not present

## 2022-08-01 LAB — RAD ONC ARIA SESSION SUMMARY
Course Elapsed Days: 42
Plan Fractions Treated to Date: 28
Plan Prescribed Dose Per Fraction: 2 Gy
Plan Total Fractions Prescribed: 40
Plan Total Prescribed Dose: 80 Gy
Reference Point Dosage Given to Date: 56 Gy
Reference Point Session Dosage Given: 2 Gy
Session Number: 28

## 2022-08-01 LAB — CBC
HCT: 37.7 % — ABNORMAL LOW (ref 39.0–52.0)
Hemoglobin: 13 g/dL (ref 13.0–17.0)
MCH: 30.7 pg (ref 26.0–34.0)
MCHC: 34.5 g/dL (ref 30.0–36.0)
MCV: 89.1 fL (ref 80.0–100.0)
Platelets: 201 10*3/uL (ref 150–400)
RBC: 4.23 MIL/uL (ref 4.22–5.81)
RDW: 14.6 % (ref 11.5–15.5)
WBC: 5.5 10*3/uL (ref 4.0–10.5)
nRBC: 0 % (ref 0.0–0.2)

## 2022-08-02 ENCOUNTER — Ambulatory Visit
Admission: RE | Admit: 2022-08-02 | Discharge: 2022-08-02 | Disposition: A | Discharge status: Home / Self Care | Payer: Medicare HMO | Source: Ambulatory Visit | Attending: Radiation Oncology | Admitting: Radiation Oncology

## 2022-08-02 ENCOUNTER — Other Ambulatory Visit: Payer: Self-pay

## 2022-08-02 DIAGNOSIS — Z191 Hormone sensitive malignancy status: Secondary | ICD-10-CM | POA: Diagnosis not present

## 2022-08-02 DIAGNOSIS — M109 Gout, unspecified: Secondary | ICD-10-CM | POA: Diagnosis not present

## 2022-08-02 DIAGNOSIS — Z7984 Long term (current) use of oral hypoglycemic drugs: Secondary | ICD-10-CM | POA: Diagnosis not present

## 2022-08-02 DIAGNOSIS — Z87891 Personal history of nicotine dependence: Secondary | ICD-10-CM | POA: Diagnosis not present

## 2022-08-02 DIAGNOSIS — E785 Hyperlipidemia, unspecified: Secondary | ICD-10-CM | POA: Diagnosis not present

## 2022-08-02 DIAGNOSIS — I10 Essential (primary) hypertension: Secondary | ICD-10-CM | POA: Diagnosis not present

## 2022-08-02 DIAGNOSIS — C61 Malignant neoplasm of prostate: Secondary | ICD-10-CM | POA: Diagnosis not present

## 2022-08-02 DIAGNOSIS — Z79899 Other long term (current) drug therapy: Secondary | ICD-10-CM | POA: Diagnosis not present

## 2022-08-02 DIAGNOSIS — E119 Type 2 diabetes mellitus without complications: Secondary | ICD-10-CM | POA: Diagnosis not present

## 2022-08-02 DIAGNOSIS — Z510 Encounter for antineoplastic radiation therapy: Secondary | ICD-10-CM | POA: Diagnosis not present

## 2022-08-02 DIAGNOSIS — Z86010 Personal history of colonic polyps: Secondary | ICD-10-CM | POA: Diagnosis not present

## 2022-08-02 LAB — RAD ONC ARIA SESSION SUMMARY
Course Elapsed Days: 43
Plan Fractions Treated to Date: 29
Plan Prescribed Dose Per Fraction: 2 Gy
Plan Total Fractions Prescribed: 40
Plan Total Prescribed Dose: 80 Gy
Reference Point Dosage Given to Date: 58 Gy
Reference Point Session Dosage Given: 2 Gy
Session Number: 29

## 2022-08-05 ENCOUNTER — Other Ambulatory Visit: Payer: Self-pay | Admitting: Family

## 2022-08-06 ENCOUNTER — Ambulatory Visit
Admission: RE | Admit: 2022-08-06 | Discharge: 2022-08-06 | Disposition: A | Discharge status: Home / Self Care | Payer: Medicare HMO | Source: Ambulatory Visit | Attending: Radiation Oncology | Admitting: Radiation Oncology

## 2022-08-06 ENCOUNTER — Encounter: Payer: Self-pay | Admitting: Family

## 2022-08-06 ENCOUNTER — Other Ambulatory Visit: Payer: Self-pay

## 2022-08-06 ENCOUNTER — Ambulatory Visit (INDEPENDENT_AMBULATORY_CARE_PROVIDER_SITE_OTHER): Payer: Medicare HMO | Admitting: Family

## 2022-08-06 VITALS — BP 132/72 | HR 75 | Temp 97.4°F | Resp 16 | Ht 71.0 in | Wt 241.0 lb

## 2022-08-06 DIAGNOSIS — F1090 Alcohol use, unspecified, uncomplicated: Secondary | ICD-10-CM

## 2022-08-06 DIAGNOSIS — E119 Type 2 diabetes mellitus without complications: Secondary | ICD-10-CM | POA: Insufficient documentation

## 2022-08-06 DIAGNOSIS — E13319 Other specified diabetes mellitus with unspecified diabetic retinopathy without macular edema: Secondary | ICD-10-CM | POA: Diagnosis not present

## 2022-08-06 DIAGNOSIS — C61 Malignant neoplasm of prostate: Secondary | ICD-10-CM | POA: Diagnosis not present

## 2022-08-06 DIAGNOSIS — M1A09X0 Idiopathic chronic gout, multiple sites, without tophus (tophi): Secondary | ICD-10-CM | POA: Diagnosis not present

## 2022-08-06 DIAGNOSIS — Z79899 Other long term (current) drug therapy: Secondary | ICD-10-CM | POA: Diagnosis not present

## 2022-08-06 DIAGNOSIS — E1122 Type 2 diabetes mellitus with diabetic chronic kidney disease: Secondary | ICD-10-CM | POA: Diagnosis not present

## 2022-08-06 DIAGNOSIS — Z789 Other specified health status: Secondary | ICD-10-CM

## 2022-08-06 DIAGNOSIS — E785 Hyperlipidemia, unspecified: Secondary | ICD-10-CM | POA: Insufficient documentation

## 2022-08-06 DIAGNOSIS — Z191 Hormone sensitive malignancy status: Secondary | ICD-10-CM | POA: Diagnosis not present

## 2022-08-06 DIAGNOSIS — Z86010 Personal history of colonic polyps: Secondary | ICD-10-CM | POA: Insufficient documentation

## 2022-08-06 DIAGNOSIS — Z803 Family history of malignant neoplasm of breast: Secondary | ICD-10-CM | POA: Diagnosis not present

## 2022-08-06 DIAGNOSIS — E1169 Type 2 diabetes mellitus with other specified complication: Secondary | ICD-10-CM

## 2022-08-06 DIAGNOSIS — I10 Essential (primary) hypertension: Secondary | ICD-10-CM | POA: Diagnosis not present

## 2022-08-06 DIAGNOSIS — R809 Proteinuria, unspecified: Secondary | ICD-10-CM | POA: Diagnosis not present

## 2022-08-06 DIAGNOSIS — N182 Chronic kidney disease, stage 2 (mild): Secondary | ICD-10-CM

## 2022-08-06 DIAGNOSIS — R0609 Other forms of dyspnea: Secondary | ICD-10-CM

## 2022-08-06 DIAGNOSIS — Z510 Encounter for antineoplastic radiation therapy: Secondary | ICD-10-CM | POA: Diagnosis not present

## 2022-08-06 DIAGNOSIS — Z7984 Long term (current) use of oral hypoglycemic drugs: Secondary | ICD-10-CM | POA: Diagnosis not present

## 2022-08-06 DIAGNOSIS — M109 Gout, unspecified: Secondary | ICD-10-CM | POA: Diagnosis not present

## 2022-08-06 DIAGNOSIS — Z87891 Personal history of nicotine dependence: Secondary | ICD-10-CM | POA: Insufficient documentation

## 2022-08-06 DIAGNOSIS — R7989 Other specified abnormal findings of blood chemistry: Secondary | ICD-10-CM

## 2022-08-06 LAB — RAD ONC ARIA SESSION SUMMARY
Course Elapsed Days: 47
Plan Fractions Treated to Date: 30
Plan Prescribed Dose Per Fraction: 2 Gy
Plan Total Fractions Prescribed: 40
Plan Total Prescribed Dose: 80 Gy
Reference Point Dosage Given to Date: 60 Gy
Reference Point Session Dosage Given: 2 Gy
Session Number: 30

## 2022-08-06 NOTE — Patient Instructions (Addendum)
  Schedule colonoscopy as you are overdue for this.   Overdue for eye exam to test for diabetic retinopathy. Please schedule this with your eye doctor.   Schedule dental exam.   I have created an order for lab work today during our visit.  Please schedule an appointment on your way out to return to the lab at your convenience. Please return fasting at your lab appointment (meaning you can only drink black coffee and or water prior to your appointment). I will reach out to you in regards to the labs when I receive the results.    Regards,   Eugenia Pancoast FNP-C

## 2022-08-06 NOTE — Assessment & Plan Note (Signed)
Continue medications as prescribed.  Order A1c pending results.  Make eye exam, overdue for diabetic retinopathy screen.  Positive microalbumin h/o however high allergic reaction to lisinopril hesitant to utilize losartan. Will keep consideration of this in future and will continue to monitor kidneys.

## 2022-08-06 NOTE — Assessment & Plan Note (Signed)
Stable

## 2022-08-06 NOTE — Assessment & Plan Note (Signed)
Continue crestor weekly,  Can consider zetia in future if needed. Order lipid panel, work on low chol diet.

## 2022-08-06 NOTE — Assessment & Plan Note (Signed)
Repeat today although trending down after reviewing.

## 2022-08-06 NOTE — Assessment & Plan Note (Signed)
Recent EGFR within acceptable limits. Will continue to monitor.

## 2022-08-06 NOTE — Progress Notes (Signed)
Established Patient Office Visit  Subjective:  Patient ID: Jeremy Andrews, male    DOB: 23-Feb-1956  Age: 67 y.o. MRN: [id number]  CC:  Chief Complaint  Patient presents with   Transitions Of Care    Transferring from Dr. Einar Pheasant    HPI March Steyer is here for a transition of care visit.  Prior provider was: Dr. Waunita Schooner.  Pt is without acute concerns.   chronic concerns:  DM2:  taking metformin 1000 mg twice daily with meals.  Also jardiance 10 mg once daily. Tolerating well with both.   Hyperlipidemia: tolerating 5 mg rosuvastatin, only can tolerate once weekly.  Lab Results  Component Value Date   CHOL 184 01/08/2022   HDL 31.20 (L) 01/08/2022   LDLCALC 119 (H) 01/08/2022   TRIG 168.0 (H) 01/08/2022   CHOLHDL 6 01/08/2022   Elevated psa: seeing urology currently. Diagnosed with prostate cancer by urology with biopsy. Starting in November until 1/16 pt currently doing radiation, every am.  Past Medical History:  Diagnosis Date   Allergy    Colon polyps    Diabetes mellitus without complication (HCC)    Gout    Hyperlipidemia    Hypertension    Chronic gout, sees rheumatologist Dr. Graylon Good, stable on daily allopurinol 100 mg.   DM2: overdue for eye exam, has not had in the last 2-3 years.  Also overdue for dentist, but plans to do this when he is done with radiology.   Colonoscopy: overdue will make appointment.   Past Surgical History:  Procedure Laterality Date   COLONOSCOPY W/ POLYPECTOMY N/A     Family History  Problem Relation Age of Onset   Diabetes Mother    Hypertension Mother    Gout Mother    Other Mother        meningioma   Hypertension Father    Kidney disease Sister    Hypertension Sister    Kidney failure Sister    Other Sister        legally blind   Diabetes Sister    Breast cancer Sister    Diabetes Brother    Arthritis Brother    Gout Brother    Breast cancer Maternal Grandmother 22    Social History   Socioeconomic History    Marital status: Significant Other    Spouse name: Not on file   Number of children: 1   Years of education: 2 years of college   Highest education level: Not on file  Occupational History   Not on file  Tobacco Use   Smoking status: Former    Packs/day: 0.50    Years: 25.00    Total pack years: 12.50    Types: Cigarettes    Quit date: 1990    Years since quitting: 34.0   Smokeless tobacco: Never  Vaping Use   Vaping Use: Never used  Substance and Sexual Activity   Alcohol use: Yes    Alcohol/week: 2.0 - 3.0 standard drinks of alcohol    Types: 2 - 3 Cans of beer per week    Comment: 2-3 beers weekly   Drug use: Not Currently    Types: Marijuana    Comment: last time around 2000 or even before then   Sexual activity: Yes    Birth control/protection: None, Post-menopausal  Other Topics Concern   Not on file  Social History Narrative   02/09/19   From: the area   Living: alone   Work: retired - from [employer] - [employer]   Has S/O does not live together. Works good for them.       Family: one daughter - with CP in a group home, one son who has passed, and 3 step sons, separated from wife but good relationship, father also supportive      Enjoys: not much currently, restoring cars, tablet games      Exercise: not really, mowing the yard   Diet: does not follow a diabetic diet      Safety   Seat belts: Yes    Guns: No   Safe in relationships: Yes    Social Determinants of Health   Financial Resource Strain: Low Risk  (11/01/2021)   Overall Financial Resource Strain (CARDIA)    Difficulty of Paying Living Expenses: Not hard at all  Food Insecurity: No Food Insecurity (11/01/2021)   Hunger Vital Sign    Worried About Running Out of Food in the Last Year: Never true    [place] in the Last Year: Never true  Transportation Needs: No Transportation Needs (11/01/2021)   PRAPARE - Hydrologist (Medical): No     Lack of Transportation (Non-Medical): No  Physical Activity: Sufficiently Active (11/01/2021)   Exercise Vital Sign    Days of Exercise per Week: 5 days    Minutes of Exercise per Session: 30 min  Stress: No Stress Concern Present (10/31/2020)   [place]    Feeling of Stress : Not at all  Social Connections: Moderately Integrated (11/01/2021)   Social Connection and Isolation Panel [NHANES]    Frequency of Communication with Friends and Family: More than three times a week    Frequency of Social Gatherings with Friends and Family: Once a week    Attends Religious Services: 1 to 4 times per year    Active Member of [employer] or Organizations: No    Attends Archivist Meetings: 1 to 4 times per year    Marital Status: Separated  Intimate Partner Violence: Not At Risk (11/01/2021)   Humiliation, Afraid, Rape, and Kick questionnaire    Fear of Current or Ex-Partner: No    Emotionally Abused: No    Physically Abused: No    Sexually Abused: No    Outpatient Medications Prior to Visit  Medication Sig Dispense Refill   Alcohol Swabs (B-D SINGLE USE SWABS REGULAR) PADS USE UP TO FOUR TIMES DAILY AS DIRECTED 1 each 2   allopurinol (ZYLOPRIM) 100 MG tablet Take 1 tablet (100 mg total) by mouth daily. 90 tablet 3   amLODipine (NORVASC) 10 MG tablet Take 1 tablet (10 mg total) by mouth daily. 90 tablet 0   blood glucose meter kit and supplies KIT Dispense based on patient and insurance preference. Use up to four times daily as directed. 1 each 0   celecoxib (CELEBREX) 200 MG capsule TAKE 1 CAPSULE (200 MG TOTAL) BY MOUTH AS NEEDED FOR MILD PAIN OR MODERATE PAIN 30 capsule 0   cetirizine (ZYRTEC) 10 MG tablet TAKE 1 TABLET BY MOUTH EVERY DAY 90 tablet 3   Ferrous Sulfate (IRON) 325 (65 Fe) MG TABS Take 1 tablet (325 mg total) by mouth daily. 30 tablet 3   JARDIANCE 10 MG TABS tablet TAKE 1 TABLET EVERY DAY BEFORE BREAKFAST 90  tablet 0   labetalol (NORMODYNE) 100 MG tablet Take 0.5 tablets (50 mg total) by mouth 2 (two) times daily.  90 tablet 0   metFORMIN (GLUCOPHAGE) 500 MG tablet Take 2 tablets (1,000 mg total) by mouth 2 (two) times daily with a meal. 360 tablet 0   omeprazole (PRILOSEC) 20 MG capsule Take 1 capsule (20 mg total) by mouth daily. 90 capsule 1   rosuvastatin (CRESTOR) 5 MG tablet TAKE 1 TABLET EVERY WEEK 12 tablet 0   tamsulosin (FLOMAX) 0.4 MG CAPS capsule TAKE 1 CAPSULE BY MOUTH EVERY DAY 30 capsule 0   TRUE METRIX BLOOD GLUCOSE TEST test strip USE  UP  TO FOUR TIMES DAILY AS DIRECTED 100 strip 1   TRUEplus Lancets 33G MISC USE  UP  TO FOUR TIMES DAILY AS DIRECTED 100 each 1   No facility-administered medications prior to visit.    Allergies  Allergen Reactions   Lisinopril Swelling   Statins     Severe myopathy, in wheel chair Severe myopathy, in wheel chair       Objective:    Physical Exam  Gen: NAD, resting comfortably CV: RRR with no murmurs appreciated Pulm: NWOB, CTAB with no crackles, wheezes, or rhonchi Skin: warm, dry Psych: Normal affect and thought content  BP 132/72   Pulse 75   Temp (!) 97.4 F (36.3 C)   Resp 16   Ht _0  (1.803 m)   Wt 241 lb (109.3 kg)   SpO2 99%   BMI 33.61 kg/m  Wt Readings from Last 3 Encounters:  08/06/22 241 lb (109.3 kg)  06/07/22 245 lb (111.1 kg)  05/21/22 245 lb (111.1 kg)     Health Maintenance Due  Topic Date Due   Zoster Vaccines- Shingrix (1 of 2) Never done   COLONOSCOPY (Pts 45-65yr Insurance coverage will need to be confirmed)  02/03/2021   COVID-19 Vaccine (4 - 2023-24 season) 04/05/2022   OPHTHALMOLOGY EXAM  06/26/2022    There are no preventive care reminders to display for this patient.  No results found for: "TSH" Lab Results  Component Value Date   WBC 5.5 08/01/2022   HGB 13.0 08/01/2022   HCT 37.7 (L) 08/01/2022   MCV 89.1 08/01/2022   PLT 201 08/01/2022   Lab Results  Component Value Date    NA 136 01/08/2022   K 4.5 01/08/2022   CO2 28 01/08/2022   GLUCOSE 226 (H) 01/08/2022   BUN 8 01/08/2022   CREATININE 0.88 01/08/2022   BILITOT 0.6 01/08/2022   ALKPHOS 61 01/08/2022   AST 45 (H) 01/08/2022   ALT 36 01/08/2022   PROT 7.2 01/08/2022   ALBUMIN 3.9 01/08/2022   CALCIUM 9.7 01/08/2022   ANIONGAP 10 05/04/2019   GFR 88.62 01/08/2022   Lab Results  Component Value Date   CHOL 184 01/08/2022   Lab Results  Component Value Date   HDL 31.20 (L) 01/08/2022   Lab Results  Component Value Date   LDLCALC 119 (H) 01/08/2022   Lab Results  Component Value Date   TRIG 168.0 (H) 01/08/2022   Lab Results  Component Value Date   CHOLHDL 6 01/08/2022   Lab Results  Component Value Date   HGBA1C 5.9 (A) 04/15/2022      Assessment & Plan:   Problem List Items Addressed This Visit       Endocrine   Type 2 diabetes mellitus with other specified complication ([place]    Continue medications as prescribed.  Order A1c pending results.  Make eye exam, overdue for diabetic retinopathy screen.  Positive microalbumin h/o however high allergic reaction to lisinopril  hesitant to utilize losartan. Will keep consideration of this in future and will continue to monitor kidneys.        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      CKD stage 2 due to type 2 diabetes mellitus ([place]) - Primary    Recent EGFR within acceptable limits. Will continue to monitor.        Genitourinary   Prostate cancer ([place])     Other   Hyperlipidemia    Continue crestor weekly,  Can consider zetia in future if needed. Order lipid panel, work on low chol diet.      Relevant Orders   Lipid panel   Gout    Chronic, occasional flare ups that leave him with worsening ability to walk due to pain.  Stable on allopurinol 100 mg daily.       Dyspnea on exertion     Stable.      Positive for microalbuminuria   Elevated LFTs    Repeat today although trending down after reviewing.      Relevant Orders   Comprehensive metabolic panel   RESOLVED: Alcohol use    Controlled only a few times per week.       No orders of the defined types were placed in this encounter.   Follow-up: Return in about 6 months (around 02/04/2023) for f/u diabetes.    Eugenia Pancoast, FNP

## 2022-08-06 NOTE — Assessment & Plan Note (Signed)
Chronic, occasional flare ups that leave him with worsening ability to walk due to pain.  Stable on allopurinol 100 mg daily.

## 2022-08-06 NOTE — Assessment & Plan Note (Signed)
Overdue for eye exam, pt states will schedule appt upon finish radiation in two weeks.

## 2022-08-06 NOTE — Assessment & Plan Note (Signed)
Controlled only a few times per week.

## 2022-08-07 ENCOUNTER — Ambulatory Visit
Admission: RE | Admit: 2022-08-07 | Discharge: 2022-08-07 | Disposition: A | Discharge status: Home / Self Care | Payer: Medicare HMO | Source: Ambulatory Visit | Attending: Radiation Oncology | Admitting: Radiation Oncology

## 2022-08-07 ENCOUNTER — Other Ambulatory Visit: Payer: Self-pay

## 2022-08-07 DIAGNOSIS — E119 Type 2 diabetes mellitus without complications: Secondary | ICD-10-CM | POA: Diagnosis not present

## 2022-08-07 DIAGNOSIS — Z87891 Personal history of nicotine dependence: Secondary | ICD-10-CM | POA: Diagnosis not present

## 2022-08-07 DIAGNOSIS — Z86010 Personal history of colonic polyps: Secondary | ICD-10-CM | POA: Diagnosis not present

## 2022-08-07 DIAGNOSIS — C61 Malignant neoplasm of prostate: Secondary | ICD-10-CM | POA: Diagnosis not present

## 2022-08-07 DIAGNOSIS — Z79899 Other long term (current) drug therapy: Secondary | ICD-10-CM | POA: Diagnosis not present

## 2022-08-07 DIAGNOSIS — M109 Gout, unspecified: Secondary | ICD-10-CM | POA: Diagnosis not present

## 2022-08-07 DIAGNOSIS — I10 Essential (primary) hypertension: Secondary | ICD-10-CM | POA: Diagnosis not present

## 2022-08-07 DIAGNOSIS — E785 Hyperlipidemia, unspecified: Secondary | ICD-10-CM | POA: Diagnosis not present

## 2022-08-07 DIAGNOSIS — Z7984 Long term (current) use of oral hypoglycemic drugs: Secondary | ICD-10-CM | POA: Diagnosis not present

## 2022-08-07 DIAGNOSIS — Z191 Hormone sensitive malignancy status: Secondary | ICD-10-CM | POA: Diagnosis not present

## 2022-08-07 DIAGNOSIS — Z510 Encounter for antineoplastic radiation therapy: Secondary | ICD-10-CM | POA: Diagnosis not present

## 2022-08-07 LAB — RAD ONC ARIA SESSION SUMMARY
Course Elapsed Days: 48
Plan Fractions Treated to Date: 31
Plan Prescribed Dose Per Fraction: 2 Gy
Plan Total Fractions Prescribed: 40
Plan Total Prescribed Dose: 80 Gy
Reference Point Dosage Given to Date: 62 Gy
Reference Point Session Dosage Given: 2 Gy
Session Number: 31

## 2022-08-08 ENCOUNTER — Other Ambulatory Visit: Payer: Self-pay

## 2022-08-08 ENCOUNTER — Ambulatory Visit
Admission: RE | Admit: 2022-08-08 | Discharge: 2022-08-08 | Disposition: A | Discharge status: Home / Self Care | Payer: Medicare HMO | Source: Ambulatory Visit | Attending: Radiation Oncology | Admitting: Radiation Oncology

## 2022-08-08 DIAGNOSIS — C61 Malignant neoplasm of prostate: Secondary | ICD-10-CM | POA: Diagnosis not present

## 2022-08-08 DIAGNOSIS — I10 Essential (primary) hypertension: Secondary | ICD-10-CM | POA: Diagnosis not present

## 2022-08-08 DIAGNOSIS — E785 Hyperlipidemia, unspecified: Secondary | ICD-10-CM | POA: Diagnosis not present

## 2022-08-08 DIAGNOSIS — Z510 Encounter for antineoplastic radiation therapy: Secondary | ICD-10-CM | POA: Diagnosis not present

## 2022-08-08 DIAGNOSIS — Z79899 Other long term (current) drug therapy: Secondary | ICD-10-CM | POA: Diagnosis not present

## 2022-08-08 DIAGNOSIS — Z191 Hormone sensitive malignancy status: Secondary | ICD-10-CM | POA: Diagnosis not present

## 2022-08-08 DIAGNOSIS — Z7984 Long term (current) use of oral hypoglycemic drugs: Secondary | ICD-10-CM | POA: Diagnosis not present

## 2022-08-08 DIAGNOSIS — Z87891 Personal history of nicotine dependence: Secondary | ICD-10-CM | POA: Diagnosis not present

## 2022-08-08 DIAGNOSIS — E119 Type 2 diabetes mellitus without complications: Secondary | ICD-10-CM | POA: Diagnosis not present

## 2022-08-08 DIAGNOSIS — Z86010 Personal history of colonic polyps: Secondary | ICD-10-CM | POA: Diagnosis not present

## 2022-08-08 DIAGNOSIS — M109 Gout, unspecified: Secondary | ICD-10-CM | POA: Diagnosis not present

## 2022-08-08 LAB — RAD ONC ARIA SESSION SUMMARY
Course Elapsed Days: 49
Plan Fractions Treated to Date: 32
Plan Prescribed Dose Per Fraction: 2 Gy
Plan Total Fractions Prescribed: 40
Plan Total Prescribed Dose: 80 Gy
Reference Point Dosage Given to Date: 64 Gy
Reference Point Session Dosage Given: 2 Gy
Session Number: 32

## 2022-08-09 ENCOUNTER — Ambulatory Visit
Admission: RE | Admit: 2022-08-09 | Discharge: 2022-08-09 | Disposition: A | Discharge status: Home / Self Care | Payer: Medicare HMO | Source: Ambulatory Visit | Attending: Radiation Oncology | Admitting: Radiation Oncology

## 2022-08-09 ENCOUNTER — Other Ambulatory Visit: Payer: Self-pay

## 2022-08-09 DIAGNOSIS — Z87891 Personal history of nicotine dependence: Secondary | ICD-10-CM | POA: Diagnosis not present

## 2022-08-09 DIAGNOSIS — E119 Type 2 diabetes mellitus without complications: Secondary | ICD-10-CM | POA: Diagnosis not present

## 2022-08-09 DIAGNOSIS — I10 Essential (primary) hypertension: Secondary | ICD-10-CM | POA: Diagnosis not present

## 2022-08-09 DIAGNOSIS — Z7984 Long term (current) use of oral hypoglycemic drugs: Secondary | ICD-10-CM | POA: Diagnosis not present

## 2022-08-09 DIAGNOSIS — E785 Hyperlipidemia, unspecified: Secondary | ICD-10-CM | POA: Diagnosis not present

## 2022-08-09 DIAGNOSIS — Z510 Encounter for antineoplastic radiation therapy: Secondary | ICD-10-CM | POA: Diagnosis not present

## 2022-08-09 DIAGNOSIS — Z79899 Other long term (current) drug therapy: Secondary | ICD-10-CM | POA: Diagnosis not present

## 2022-08-09 DIAGNOSIS — Z191 Hormone sensitive malignancy status: Secondary | ICD-10-CM | POA: Diagnosis not present

## 2022-08-09 DIAGNOSIS — M109 Gout, unspecified: Secondary | ICD-10-CM | POA: Diagnosis not present

## 2022-08-09 DIAGNOSIS — C61 Malignant neoplasm of prostate: Secondary | ICD-10-CM | POA: Diagnosis not present

## 2022-08-09 DIAGNOSIS — Z86010 Personal history of colonic polyps: Secondary | ICD-10-CM | POA: Diagnosis not present

## 2022-08-09 LAB — RAD ONC ARIA SESSION SUMMARY
Course Elapsed Days: 50
Plan Fractions Treated to Date: 33
Plan Prescribed Dose Per Fraction: 2 Gy
Plan Total Fractions Prescribed: 40
Plan Total Prescribed Dose: 80 Gy
Reference Point Dosage Given to Date: 66 Gy
Reference Point Session Dosage Given: 2 Gy
Session Number: 33

## 2022-08-12 ENCOUNTER — Other Ambulatory Visit: Payer: Self-pay

## 2022-08-12 ENCOUNTER — Ambulatory Visit
Admission: RE | Admit: 2022-08-12 | Discharge: 2022-08-12 | Disposition: A | Discharge status: Home / Self Care | Payer: Medicare HMO | Source: Ambulatory Visit | Attending: Radiation Oncology | Admitting: Radiation Oncology

## 2022-08-12 DIAGNOSIS — Z79899 Other long term (current) drug therapy: Secondary | ICD-10-CM | POA: Diagnosis not present

## 2022-08-12 DIAGNOSIS — M109 Gout, unspecified: Secondary | ICD-10-CM | POA: Diagnosis not present

## 2022-08-12 DIAGNOSIS — E119 Type 2 diabetes mellitus without complications: Secondary | ICD-10-CM | POA: Diagnosis not present

## 2022-08-12 DIAGNOSIS — C61 Malignant neoplasm of prostate: Secondary | ICD-10-CM | POA: Diagnosis not present

## 2022-08-12 DIAGNOSIS — E785 Hyperlipidemia, unspecified: Secondary | ICD-10-CM | POA: Diagnosis not present

## 2022-08-12 DIAGNOSIS — Z7984 Long term (current) use of oral hypoglycemic drugs: Secondary | ICD-10-CM | POA: Diagnosis not present

## 2022-08-12 DIAGNOSIS — I10 Essential (primary) hypertension: Secondary | ICD-10-CM | POA: Diagnosis not present

## 2022-08-12 DIAGNOSIS — Z510 Encounter for antineoplastic radiation therapy: Secondary | ICD-10-CM | POA: Diagnosis not present

## 2022-08-12 DIAGNOSIS — Z87891 Personal history of nicotine dependence: Secondary | ICD-10-CM | POA: Diagnosis not present

## 2022-08-12 DIAGNOSIS — Z86010 Personal history of colonic polyps: Secondary | ICD-10-CM | POA: Diagnosis not present

## 2022-08-12 DIAGNOSIS — Z191 Hormone sensitive malignancy status: Secondary | ICD-10-CM | POA: Diagnosis not present

## 2022-08-12 LAB — RAD ONC ARIA SESSION SUMMARY
Course Elapsed Days: 53
Plan Fractions Treated to Date: 34
Plan Prescribed Dose Per Fraction: 2 Gy
Plan Total Fractions Prescribed: 40
Plan Total Prescribed Dose: 80 Gy
Reference Point Dosage Given to Date: 68 Gy
Reference Point Session Dosage Given: 2 Gy
Session Number: 34

## 2022-08-13 ENCOUNTER — Other Ambulatory Visit: Payer: Self-pay

## 2022-08-13 ENCOUNTER — Ambulatory Visit
Admission: RE | Admit: 2022-08-13 | Discharge: 2022-08-13 | Disposition: A | Discharge status: Home / Self Care | Payer: Medicare HMO | Source: Ambulatory Visit | Attending: Radiation Oncology | Admitting: Radiation Oncology

## 2022-08-13 DIAGNOSIS — Z7984 Long term (current) use of oral hypoglycemic drugs: Secondary | ICD-10-CM | POA: Diagnosis not present

## 2022-08-13 DIAGNOSIS — Z87891 Personal history of nicotine dependence: Secondary | ICD-10-CM | POA: Diagnosis not present

## 2022-08-13 DIAGNOSIS — M109 Gout, unspecified: Secondary | ICD-10-CM | POA: Diagnosis not present

## 2022-08-13 DIAGNOSIS — E785 Hyperlipidemia, unspecified: Secondary | ICD-10-CM | POA: Diagnosis not present

## 2022-08-13 DIAGNOSIS — C61 Malignant neoplasm of prostate: Secondary | ICD-10-CM | POA: Diagnosis not present

## 2022-08-13 DIAGNOSIS — Z191 Hormone sensitive malignancy status: Secondary | ICD-10-CM | POA: Diagnosis not present

## 2022-08-13 DIAGNOSIS — Z79899 Other long term (current) drug therapy: Secondary | ICD-10-CM | POA: Diagnosis not present

## 2022-08-13 DIAGNOSIS — E119 Type 2 diabetes mellitus without complications: Secondary | ICD-10-CM | POA: Diagnosis not present

## 2022-08-13 DIAGNOSIS — Z86010 Personal history of colonic polyps: Secondary | ICD-10-CM | POA: Diagnosis not present

## 2022-08-13 DIAGNOSIS — I10 Essential (primary) hypertension: Secondary | ICD-10-CM | POA: Diagnosis not present

## 2022-08-13 DIAGNOSIS — Z510 Encounter for antineoplastic radiation therapy: Secondary | ICD-10-CM | POA: Diagnosis not present

## 2022-08-13 LAB — RAD ONC ARIA SESSION SUMMARY
Course Elapsed Days: 54
Plan Fractions Treated to Date: 35
Plan Prescribed Dose Per Fraction: 2 Gy
Plan Total Fractions Prescribed: 40
Plan Total Prescribed Dose: 80 Gy
Reference Point Dosage Given to Date: 70 Gy
Reference Point Session Dosage Given: 2 Gy
Session Number: 35

## 2022-08-14 ENCOUNTER — Ambulatory Visit: Payer: Medicare HMO

## 2022-08-14 ENCOUNTER — Other Ambulatory Visit (INDEPENDENT_AMBULATORY_CARE_PROVIDER_SITE_OTHER): Payer: Medicare HMO

## 2022-08-14 ENCOUNTER — Other Ambulatory Visit: Payer: Self-pay | Admitting: Family

## 2022-08-14 DIAGNOSIS — E1169 Type 2 diabetes mellitus with other specified complication: Secondary | ICD-10-CM

## 2022-08-14 DIAGNOSIS — R7989 Other specified abnormal findings of blood chemistry: Secondary | ICD-10-CM

## 2022-08-14 DIAGNOSIS — E785 Hyperlipidemia, unspecified: Secondary | ICD-10-CM | POA: Diagnosis not present

## 2022-08-14 LAB — COMPREHENSIVE METABOLIC PANEL
ALT: 18 U/L (ref 0–53)
AST: 22 U/L (ref 0–37)
Albumin: 4 g/dL (ref 3.5–5.2)
Alkaline Phosphatase: 54 U/L (ref 39–117)
BUN: 11 mg/dL (ref 6–23)
CO2: 33 mEq/L — ABNORMAL HIGH (ref 19–32)
Calcium: 9.9 mg/dL (ref 8.4–10.5)
Chloride: 102 mEq/L (ref 96–112)
Creatinine, Ser: 0.84 mg/dL (ref 0.40–1.50)
GFR: 89.5 mL/min (ref >60.00)
Glucose, Bld: 167 mg/dL — ABNORMAL HIGH (ref 70–99)
Potassium: 4.2 mEq/L (ref 3.5–5.1)
Sodium: 141 mEq/L (ref 135–145)
Total Bilirubin: 0.4 mg/dL (ref 0.2–1.2)
Total Protein: 7.3 g/dL (ref 6.0–8.3)

## 2022-08-14 LAB — LIPID PANEL
Cholesterol: 187 mg/dL (ref 0–200)
HDL: 52.8 mg/dL (ref >39.00)
LDL Cholesterol: 112 mg/dL — ABNORMAL HIGH (ref 0–99)
NonHDL: 134.21
Total CHOL/HDL Ratio: 4
Triglycerides: 110 mg/dL (ref 0.0–149.0)
VLDL: 22 mg/dL (ref 0.0–40.0)

## 2022-08-14 LAB — HEMOGLOBIN A1C: Hgb A1c MFr Bld: 6.1 % (ref 4.6–6.5)

## 2022-08-15 ENCOUNTER — Ambulatory Visit
Admission: RE | Admit: 2022-08-15 | Discharge: 2022-08-15 | Disposition: A | Discharge status: Home / Self Care | Payer: Medicare HMO | Source: Ambulatory Visit | Attending: Radiation Oncology | Admitting: Radiation Oncology

## 2022-08-15 ENCOUNTER — Other Ambulatory Visit: Payer: Self-pay

## 2022-08-15 ENCOUNTER — Inpatient Hospital Stay: Payer: Medicare HMO

## 2022-08-15 DIAGNOSIS — Z86010 Personal history of colonic polyps: Secondary | ICD-10-CM | POA: Diagnosis not present

## 2022-08-15 DIAGNOSIS — M109 Gout, unspecified: Secondary | ICD-10-CM | POA: Diagnosis not present

## 2022-08-15 DIAGNOSIS — E119 Type 2 diabetes mellitus without complications: Secondary | ICD-10-CM | POA: Diagnosis not present

## 2022-08-15 DIAGNOSIS — Z7984 Long term (current) use of oral hypoglycemic drugs: Secondary | ICD-10-CM | POA: Diagnosis not present

## 2022-08-15 DIAGNOSIS — Z510 Encounter for antineoplastic radiation therapy: Secondary | ICD-10-CM | POA: Diagnosis not present

## 2022-08-15 DIAGNOSIS — E785 Hyperlipidemia, unspecified: Secondary | ICD-10-CM | POA: Diagnosis not present

## 2022-08-15 DIAGNOSIS — Z79899 Other long term (current) drug therapy: Secondary | ICD-10-CM | POA: Diagnosis not present

## 2022-08-15 DIAGNOSIS — Z191 Hormone sensitive malignancy status: Secondary | ICD-10-CM | POA: Diagnosis not present

## 2022-08-15 DIAGNOSIS — Z87891 Personal history of nicotine dependence: Secondary | ICD-10-CM | POA: Diagnosis not present

## 2022-08-15 DIAGNOSIS — I10 Essential (primary) hypertension: Secondary | ICD-10-CM | POA: Diagnosis not present

## 2022-08-15 DIAGNOSIS — C61 Malignant neoplasm of prostate: Secondary | ICD-10-CM | POA: Diagnosis not present

## 2022-08-15 LAB — RAD ONC ARIA SESSION SUMMARY
Course Elapsed Days: 56
Plan Fractions Treated to Date: 36
Plan Prescribed Dose Per Fraction: 2 Gy
Plan Total Fractions Prescribed: 40
Plan Total Prescribed Dose: 80 Gy
Reference Point Dosage Given to Date: 72 Gy
Reference Point Session Dosage Given: 2 Gy
Session Number: 36

## 2022-08-16 ENCOUNTER — Ambulatory Visit: Payer: Medicare HMO

## 2022-08-19 ENCOUNTER — Other Ambulatory Visit: Payer: Self-pay

## 2022-08-19 ENCOUNTER — Ambulatory Visit
Admission: RE | Admit: 2022-08-19 | Discharge: 2022-08-19 | Disposition: A | Discharge status: Home / Self Care | Payer: Medicare HMO | Source: Ambulatory Visit | Attending: Radiation Oncology | Admitting: Radiation Oncology

## 2022-08-19 DIAGNOSIS — Z510 Encounter for antineoplastic radiation therapy: Secondary | ICD-10-CM | POA: Diagnosis not present

## 2022-08-19 DIAGNOSIS — I10 Essential (primary) hypertension: Secondary | ICD-10-CM | POA: Diagnosis not present

## 2022-08-19 DIAGNOSIS — M109 Gout, unspecified: Secondary | ICD-10-CM | POA: Diagnosis not present

## 2022-08-19 DIAGNOSIS — Z7984 Long term (current) use of oral hypoglycemic drugs: Secondary | ICD-10-CM | POA: Diagnosis not present

## 2022-08-19 DIAGNOSIS — Z87891 Personal history of nicotine dependence: Secondary | ICD-10-CM | POA: Diagnosis not present

## 2022-08-19 DIAGNOSIS — E119 Type 2 diabetes mellitus without complications: Secondary | ICD-10-CM | POA: Diagnosis not present

## 2022-08-19 DIAGNOSIS — C61 Malignant neoplasm of prostate: Secondary | ICD-10-CM | POA: Diagnosis not present

## 2022-08-19 DIAGNOSIS — Z191 Hormone sensitive malignancy status: Secondary | ICD-10-CM | POA: Diagnosis not present

## 2022-08-19 DIAGNOSIS — Z86010 Personal history of colonic polyps: Secondary | ICD-10-CM | POA: Diagnosis not present

## 2022-08-19 DIAGNOSIS — Z79899 Other long term (current) drug therapy: Secondary | ICD-10-CM | POA: Diagnosis not present

## 2022-08-19 DIAGNOSIS — E785 Hyperlipidemia, unspecified: Secondary | ICD-10-CM | POA: Diagnosis not present

## 2022-08-19 LAB — RAD ONC ARIA SESSION SUMMARY
Course Elapsed Days: 60
Plan Fractions Treated to Date: 37
Plan Prescribed Dose Per Fraction: 2 Gy
Plan Total Fractions Prescribed: 40
Plan Total Prescribed Dose: 80 Gy
Reference Point Dosage Given to Date: 74 Gy
Reference Point Session Dosage Given: 2 Gy
Session Number: 37

## 2022-08-20 ENCOUNTER — Other Ambulatory Visit: Payer: Self-pay

## 2022-08-20 ENCOUNTER — Ambulatory Visit
Admission: RE | Admit: 2022-08-20 | Discharge: 2022-08-20 | Disposition: A | Discharge status: Home / Self Care | Payer: Medicare HMO | Source: Ambulatory Visit | Attending: Radiation Oncology | Admitting: Radiation Oncology

## 2022-08-20 ENCOUNTER — Ambulatory Visit: Payer: Medicare HMO

## 2022-08-20 ENCOUNTER — Encounter: Payer: Self-pay | Admitting: Family

## 2022-08-20 ENCOUNTER — Other Ambulatory Visit: Payer: Self-pay | Admitting: Family

## 2022-08-20 DIAGNOSIS — M109 Gout, unspecified: Secondary | ICD-10-CM | POA: Diagnosis not present

## 2022-08-20 DIAGNOSIS — Z79899 Other long term (current) drug therapy: Secondary | ICD-10-CM | POA: Diagnosis not present

## 2022-08-20 DIAGNOSIS — Z510 Encounter for antineoplastic radiation therapy: Secondary | ICD-10-CM | POA: Diagnosis not present

## 2022-08-20 DIAGNOSIS — Z86010 Personal history of colonic polyps: Secondary | ICD-10-CM | POA: Diagnosis not present

## 2022-08-20 DIAGNOSIS — E119 Type 2 diabetes mellitus without complications: Secondary | ICD-10-CM | POA: Diagnosis not present

## 2022-08-20 DIAGNOSIS — Z191 Hormone sensitive malignancy status: Secondary | ICD-10-CM | POA: Diagnosis not present

## 2022-08-20 DIAGNOSIS — Z87891 Personal history of nicotine dependence: Secondary | ICD-10-CM | POA: Diagnosis not present

## 2022-08-20 DIAGNOSIS — E785 Hyperlipidemia, unspecified: Secondary | ICD-10-CM | POA: Diagnosis not present

## 2022-08-20 DIAGNOSIS — I10 Essential (primary) hypertension: Secondary | ICD-10-CM | POA: Diagnosis not present

## 2022-08-20 DIAGNOSIS — Z7984 Long term (current) use of oral hypoglycemic drugs: Secondary | ICD-10-CM | POA: Diagnosis not present

## 2022-08-20 DIAGNOSIS — C61 Malignant neoplasm of prostate: Secondary | ICD-10-CM | POA: Diagnosis not present

## 2022-08-20 LAB — RAD ONC ARIA SESSION SUMMARY
Course Elapsed Days: 61
Plan Fractions Treated to Date: 38
Plan Prescribed Dose Per Fraction: 2 Gy
Plan Total Fractions Prescribed: 40
Plan Total Prescribed Dose: 80 Gy
Reference Point Dosage Given to Date: 76 Gy
Reference Point Session Dosage Given: 2 Gy
Session Number: 38

## 2022-08-20 MED ORDER — ROSUVASTATIN CALCIUM 10 MG PO TABS: 10.0000 mg | ORAL_TABLET | Freq: Every day | ORAL | 3 refills | Status: DC

## 2022-08-21 ENCOUNTER — Other Ambulatory Visit: Payer: Self-pay

## 2022-08-21 ENCOUNTER — Ambulatory Visit: Payer: Medicare HMO

## 2022-08-21 ENCOUNTER — Other Ambulatory Visit: Payer: Self-pay | Admitting: Family

## 2022-08-21 ENCOUNTER — Ambulatory Visit
Admission: RE | Admit: 2022-08-21 | Discharge: 2022-08-21 | Disposition: A | Discharge status: Home / Self Care | Payer: Medicare HMO | Source: Ambulatory Visit | Attending: Radiation Oncology | Admitting: Radiation Oncology

## 2022-08-21 DIAGNOSIS — I10 Essential (primary) hypertension: Secondary | ICD-10-CM | POA: Diagnosis not present

## 2022-08-21 DIAGNOSIS — Z87891 Personal history of nicotine dependence: Secondary | ICD-10-CM | POA: Diagnosis not present

## 2022-08-21 DIAGNOSIS — Z7984 Long term (current) use of oral hypoglycemic drugs: Secondary | ICD-10-CM | POA: Diagnosis not present

## 2022-08-21 DIAGNOSIS — Z86010 Personal history of colonic polyps: Secondary | ICD-10-CM | POA: Diagnosis not present

## 2022-08-21 DIAGNOSIS — Z79899 Other long term (current) drug therapy: Secondary | ICD-10-CM | POA: Diagnosis not present

## 2022-08-21 DIAGNOSIS — E119 Type 2 diabetes mellitus without complications: Secondary | ICD-10-CM | POA: Diagnosis not present

## 2022-08-21 DIAGNOSIS — Z191 Hormone sensitive malignancy status: Secondary | ICD-10-CM | POA: Diagnosis not present

## 2022-08-21 DIAGNOSIS — Z510 Encounter for antineoplastic radiation therapy: Secondary | ICD-10-CM | POA: Diagnosis not present

## 2022-08-21 DIAGNOSIS — E785 Hyperlipidemia, unspecified: Secondary | ICD-10-CM | POA: Diagnosis not present

## 2022-08-21 DIAGNOSIS — M109 Gout, unspecified: Secondary | ICD-10-CM | POA: Diagnosis not present

## 2022-08-21 DIAGNOSIS — C61 Malignant neoplasm of prostate: Secondary | ICD-10-CM | POA: Diagnosis not present

## 2022-08-21 LAB — RAD ONC ARIA SESSION SUMMARY
Course Elapsed Days: 62
Plan Fractions Treated to Date: 39
Plan Prescribed Dose Per Fraction: 2 Gy
Plan Total Fractions Prescribed: 40
Plan Total Prescribed Dose: 80 Gy
Reference Point Dosage Given to Date: 78 Gy
Reference Point Session Dosage Given: 2 Gy
Session Number: 39

## 2022-08-22 ENCOUNTER — Ambulatory Visit
Admission: RE | Admit: 2022-08-22 | Discharge: 2022-08-22 | Disposition: A | Discharge status: Home / Self Care | Payer: Medicare HMO | Source: Ambulatory Visit | Attending: Radiation Oncology | Admitting: Radiation Oncology

## 2022-08-22 ENCOUNTER — Other Ambulatory Visit: Payer: Self-pay

## 2022-08-22 DIAGNOSIS — E119 Type 2 diabetes mellitus without complications: Secondary | ICD-10-CM | POA: Diagnosis not present

## 2022-08-22 DIAGNOSIS — Z86010 Personal history of colonic polyps: Secondary | ICD-10-CM | POA: Diagnosis not present

## 2022-08-22 DIAGNOSIS — I10 Essential (primary) hypertension: Secondary | ICD-10-CM | POA: Diagnosis not present

## 2022-08-22 DIAGNOSIS — Z87891 Personal history of nicotine dependence: Secondary | ICD-10-CM | POA: Diagnosis not present

## 2022-08-22 DIAGNOSIS — Z7984 Long term (current) use of oral hypoglycemic drugs: Secondary | ICD-10-CM | POA: Diagnosis not present

## 2022-08-22 DIAGNOSIS — C61 Malignant neoplasm of prostate: Secondary | ICD-10-CM | POA: Diagnosis not present

## 2022-08-22 DIAGNOSIS — Z510 Encounter for antineoplastic radiation therapy: Secondary | ICD-10-CM | POA: Diagnosis not present

## 2022-08-22 DIAGNOSIS — E785 Hyperlipidemia, unspecified: Secondary | ICD-10-CM | POA: Diagnosis not present

## 2022-08-22 DIAGNOSIS — Z191 Hormone sensitive malignancy status: Secondary | ICD-10-CM | POA: Diagnosis not present

## 2022-08-22 DIAGNOSIS — M109 Gout, unspecified: Secondary | ICD-10-CM | POA: Diagnosis not present

## 2022-08-22 DIAGNOSIS — Z79899 Other long term (current) drug therapy: Secondary | ICD-10-CM | POA: Diagnosis not present

## 2022-08-22 LAB — RAD ONC ARIA SESSION SUMMARY
Course Elapsed Days: 63
Plan Fractions Treated to Date: 40
Plan Prescribed Dose Per Fraction: 2 Gy
Plan Total Fractions Prescribed: 40
Plan Total Prescribed Dose: 80 Gy
Reference Point Dosage Given to Date: 80 Gy
Reference Point Session Dosage Given: 2 Gy
Session Number: 40

## 2022-08-26 ENCOUNTER — Other Ambulatory Visit: Payer: Self-pay | Admitting: Family

## 2022-08-26 DIAGNOSIS — I10 Essential (primary) hypertension: Secondary | ICD-10-CM

## 2022-09-09 ENCOUNTER — Telehealth: Payer: Self-pay

## 2022-09-09 NOTE — Telephone Encounter (Signed)
error 

## 2022-09-23 ENCOUNTER — Other Ambulatory Visit: Payer: Self-pay | Admitting: Family

## 2022-09-23 DIAGNOSIS — E1169 Type 2 diabetes mellitus with other specified complication: Secondary | ICD-10-CM

## 2022-09-26 ENCOUNTER — Other Ambulatory Visit: Payer: Self-pay

## 2022-09-26 MED ORDER — LABETALOL HCL 100 MG PO TABS: 50.0000 mg | ORAL_TABLET | Freq: Two times a day (BID) | ORAL | 1 refills | Status: DC

## 2022-10-02 ENCOUNTER — Other Ambulatory Visit: Payer: Self-pay | Admitting: *Deleted

## 2022-10-02 ENCOUNTER — Ambulatory Visit
Admission: RE | Admit: 2022-10-02 | Discharge: 2022-10-02 | Disposition: A | Discharge status: Home / Self Care | Payer: Medicare HMO | Source: Ambulatory Visit | Attending: Radiation Oncology | Admitting: Radiation Oncology

## 2022-10-02 ENCOUNTER — Encounter: Payer: Self-pay | Admitting: Radiation Oncology

## 2022-10-02 VITALS — BP 124/77 | HR 91 | Temp 98.2°F | Resp 20 | Ht 72.0 in | Wt 244.0 lb

## 2022-10-02 DIAGNOSIS — C61 Malignant neoplasm of prostate: Secondary | ICD-10-CM | POA: Diagnosis not present

## 2022-10-02 DIAGNOSIS — Z923 Personal history of irradiation: Secondary | ICD-10-CM | POA: Diagnosis not present

## 2022-10-02 NOTE — Progress Notes (Signed)
Radiation Oncology Follow up Note  Name: Jeremy Andrews   Date:   10/02/2022 MRN:  [id number] DOB: 1956/01/08    This 67 y.o. 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  REFERRING PROVIDER: Eugenia Pancoast, FNP  HPI: Patient is a 67 year old male now out 1 month having completed IMRT radiation therapy for a Gleason 7 adenocarcinoma the prostate.  Seen today in routine follow-up he is doing well specifically denies any increased lower urinary tract symptoms diarrhea or fatigue..  COMPLICATIONS OF TREATMENT: none  FOLLOW UP COMPLIANCE: keeps appointments   PHYSICAL EXAM:  BP 124/77 (BP Location: Left Arm, Patient Position: Sitting, Cuff Size: Normal)   Pulse 91   Temp 98.2 F (36.8 C) (Tympanic)   Resp 20   Ht 6' (1.829 m) Comment: stated HT  Wt 244 lb (110.7 kg)   BMI 33.09 kg/m  Well-developed well-nourished patient in NAD. HEENT reveals PERLA, EOMI, discs not visualized.  Oral cavity is clear. No oral mucosal lesions are identified. Neck is clear without evidence of cervical or supraclavicular adenopathy. Lungs are clear to A&P. Cardiac examination is essentially unremarkable with regular rate and rhythm without murmur rub or thrill. Abdomen is benign with no organomegaly or masses noted. Motor sensory and DTR levels are equal and symmetric in the upper and lower extremities. Cranial nerves II through XII are grossly intact. Proprioception is intact. No peripheral adenopathy or edema is identified. No motor or sensory levels are noted. Crude visual fields are within normal range.  RADIOLOGY RESULTS: No current films for review  PLAN: 1 time patient is doing well very low side effect profile from his external beam radiation therapy.  I have asked to see him back in 3 months for with a repeat PSA at that time.  Patient is to call with any concerns.  I  would like to take this opportunity to thank you for allowing me to participate in the care of your patient.Noreene Filbert, MD

## 2022-10-10 ENCOUNTER — Other Ambulatory Visit: Payer: Self-pay

## 2022-10-10 DIAGNOSIS — E1169 Type 2 diabetes mellitus with other specified complication: Secondary | ICD-10-CM

## 2022-10-10 NOTE — Telephone Encounter (Signed)
Refill request for metFORMIN (GLUCOPHAGE) 500 MG tablet  LV- 08/06/22 LR- 07/07/22 ( 360tabs/no refills) NV- 11/18/22.

## 2022-10-11 MED ORDER — METFORMIN HCL 500 MG PO TABS: 1000.0000 mg | ORAL_TABLET | Freq: Two times a day (BID) | ORAL | 0 refills | Status: DC

## 2022-10-14 ENCOUNTER — Other Ambulatory Visit: Payer: Self-pay

## 2022-10-14 DIAGNOSIS — E785 Hyperlipidemia, unspecified: Secondary | ICD-10-CM

## 2022-10-14 NOTE — Telephone Encounter (Signed)
Received a request to send rosuvastatin (CRESTOR) 10 MG tablet to [facility].

## 2022-10-15 ENCOUNTER — Telehealth: Payer: Self-pay | Admitting: Family

## 2022-10-15 DIAGNOSIS — E1169 Type 2 diabetes mellitus with other specified complication: Secondary | ICD-10-CM

## 2022-10-15 MED ORDER — ROSUVASTATIN CALCIUM 10 MG PO TABS: 10.0000 mg | ORAL_TABLET | Freq: Every day | ORAL | 3 refills | Status: DC

## 2022-10-15 NOTE — Telephone Encounter (Signed)
Prescription Request  10/15/2022  LOV: 08/06/2022  What is the name of the medication or equipment?  omeprazole (PRILOSEC) 20 MG capsule  celecoxib (CELEBREX) 200 MG capsule  JARDIANCE 10 MG TABS tablet    Have you contacted your pharmacy to request a refill? Yes   Which pharmacy would you like this sent to?   [place], [address] [place] [address] Phone: [phone] Fax: [phone]    Patient notified that their request is being sent to the clinical staff for review and that they should receive a response within 2 business days.   Please advise at [facility] [phone]

## 2022-10-15 NOTE — Telephone Encounter (Signed)
Refill request for  LV- 08/06/22 NV- 11/18/22  omeprazole (PRILOSEC) 20 MG capsule LR- 06/25/22 ( 90 tab/ 1 refill) celecoxib (CELEBREX) 200 MG capsule LR- 05/13/22 ( 30 caps/ no refill) JARDIANCE 10 MG TABS tablet  LR- 05/06/22 ( 90 tabs/ no refill)

## 2022-10-16 MED ORDER — OMEPRAZOLE 20 MG PO CPDR: 20.0000 mg | DELAYED_RELEASE_CAPSULE | Freq: Every day | ORAL | 3 refills | Status: DC

## 2022-10-16 MED ORDER — CELECOXIB 200 MG PO CAPS: 200.0000 mg | ORAL_CAPSULE | ORAL | 0 refills | Status: DC | PRN

## 2022-10-16 MED ORDER — EMPAGLIFLOZIN 10 MG PO TABS: ORAL_TABLET | ORAL | 3 refills | Status: DC

## 2022-10-16 NOTE — Telephone Encounter (Signed)
Spoke with the patient and advised that his medications were sent to the mail delivery pharmacy.

## 2022-10-16 NOTE — Telephone Encounter (Signed)
Pt called back returning Shannon's call. Call back # [phone]

## 2022-10-16 NOTE — Telephone Encounter (Signed)
Left message to return call to our office.  

## 2022-10-16 NOTE — Telephone Encounter (Signed)
Refilled all requested.

## 2022-10-22 ENCOUNTER — Encounter: Payer: Self-pay | Admitting: Podiatry

## 2022-10-28 ENCOUNTER — Ambulatory Visit: Payer: Medicare HMO | Admitting: Podiatry

## 2022-10-28 ENCOUNTER — Telehealth: Payer: Self-pay | Admitting: Family

## 2022-10-28 DIAGNOSIS — E1169 Type 2 diabetes mellitus with other specified complication: Secondary | ICD-10-CM

## 2022-10-28 MED ORDER — EMPAGLIFLOZIN 10 MG PO TABS: ORAL_TABLET | ORAL | 3 refills | Status: DC

## 2022-10-28 NOTE — Telephone Encounter (Signed)
Pt called in wants to know status of refill for RX empagliflozin (JARDIANCE) 10 MG TABS tablet  stated pharmacy did not receive [phone]

## 2022-10-28 NOTE — Telephone Encounter (Signed)
Spoke with [facility] and verified the rx was there at $11.20 for 90 day supply. Per patient, he would like this medication sent to [place] [facility]. I sent that rx to the requested pharmacy for the patient.

## 2022-11-11 ENCOUNTER — Ambulatory Visit: Payer: Medicare HMO | Admitting: Podiatry

## 2022-11-18 ENCOUNTER — Other Ambulatory Visit (INDEPENDENT_AMBULATORY_CARE_PROVIDER_SITE_OTHER): Payer: Medicare HMO

## 2022-11-18 DIAGNOSIS — E785 Hyperlipidemia, unspecified: Secondary | ICD-10-CM | POA: Diagnosis not present

## 2022-11-18 LAB — LIPID PANEL
Cholesterol: 176 mg/dL (ref 0–200)
HDL: 44.3 mg/dL (ref >39.00)
LDL Cholesterol: 98 mg/dL (ref 0–99)
NonHDL: 131.31
Total CHOL/HDL Ratio: 4
Triglycerides: 165 mg/dL — ABNORMAL HIGH (ref 0.0–149.0)
VLDL: 33 mg/dL (ref 0.0–40.0)

## 2022-11-25 ENCOUNTER — Other Ambulatory Visit: Payer: Self-pay | Admitting: Family

## 2022-11-28 ENCOUNTER — Other Ambulatory Visit: Payer: Self-pay

## 2022-11-28 LAB — HM DIABETES EYE EXAM

## 2022-11-28 NOTE — Telephone Encounter (Addendum)
allopurinol (ZYLOPRIM) 100 MG tablet   LOV- 08/06/22 LR- 01/08/22 ( 90/3 refills) NV- 02/04/23

## 2022-11-29 MED ORDER — ALLOPURINOL 100 MG PO TABS: 100.0000 mg | ORAL_TABLET | Freq: Every day | ORAL | 3 refills | Status: DC

## 2022-12-05 ENCOUNTER — Ambulatory Visit: Payer: Medicare HMO | Admitting: Podiatry

## 2022-12-05 ENCOUNTER — Encounter: Payer: Self-pay | Admitting: Primary Care

## 2022-12-09 ENCOUNTER — Ambulatory Visit: Payer: Medicare HMO | Admitting: Podiatry

## 2022-12-11 ENCOUNTER — Other Ambulatory Visit: Payer: Self-pay | Admitting: Family

## 2022-12-12 ENCOUNTER — Other Ambulatory Visit: Payer: Self-pay | Admitting: Family

## 2022-12-12 ENCOUNTER — Encounter: Payer: Self-pay | Admitting: Family

## 2022-12-12 DIAGNOSIS — E113311 Type 2 diabetes mellitus with moderate nonproliferative diabetic retinopathy with macular edema, right eye: Secondary | ICD-10-CM | POA: Insufficient documentation

## 2022-12-12 DIAGNOSIS — E113212 Type 2 diabetes mellitus with mild nonproliferative diabetic retinopathy with macular edema, left eye: Secondary | ICD-10-CM | POA: Insufficient documentation

## 2022-12-20 ENCOUNTER — Other Ambulatory Visit: Payer: Self-pay | Admitting: *Deleted

## 2022-12-20 DIAGNOSIS — C61 Malignant neoplasm of prostate: Secondary | ICD-10-CM

## 2022-12-24 ENCOUNTER — Inpatient Hospital Stay: Payer: Medicare HMO | Attending: Radiation Oncology

## 2022-12-24 DIAGNOSIS — C61 Malignant neoplasm of prostate: Secondary | ICD-10-CM | POA: Diagnosis not present

## 2022-12-24 LAB — PSA: Prostatic Specific Antigen: 0.06 ng/mL (ref 0.00–4.00)

## 2023-01-01 ENCOUNTER — Encounter: Payer: Self-pay | Admitting: Radiation Oncology

## 2023-01-01 ENCOUNTER — Ambulatory Visit
Admission: RE | Admit: 2023-01-01 | Discharge: 2023-01-01 | Disposition: A | Discharge status: Home / Self Care | Payer: Medicare HMO | Source: Ambulatory Visit | Attending: Radiation Oncology | Admitting: Radiation Oncology

## 2023-01-01 ENCOUNTER — Other Ambulatory Visit: Payer: Self-pay | Admitting: *Deleted

## 2023-01-01 VITALS — BP 135/80 | HR 96 | Temp 98.3°F | Resp 16 | Wt 246.0 lb

## 2023-01-01 DIAGNOSIS — Z923 Personal history of irradiation: Secondary | ICD-10-CM | POA: Insufficient documentation

## 2023-01-01 DIAGNOSIS — C61 Malignant neoplasm of prostate: Secondary | ICD-10-CM

## 2023-01-01 DIAGNOSIS — Z191 Hormone sensitive malignancy status: Secondary | ICD-10-CM | POA: Diagnosis not present

## 2023-01-01 NOTE — Progress Notes (Signed)
Radiation Oncology Follow up Note  Name: Jeremy Andrews   Date:   01/01/2023 MRN:  [id number] DOB: 1955/10/05    This 67 y.o. male presents to the clinic today for 29-month follow-up status post IMRT radiation therapy for stage IIb Gleason 7 (3+4) adenocarcinoma presenting with a PSA of 13.  REFERRING PROVIDER: Mort Sawyers, FNP  HPI: Patient is a 67 year old male now about 4 months having completed IMRT radiation therapy for Gleason 7 adenocarcinoma.  Seen today in routine follow-up he is doing well.  He specifically denies any increased lower urinary tract symptoms diarrhea or fatigue.  His most recent PSA this month was 0.06.  COMPLICATIONS OF TREATMENT: none  FOLLOW UP COMPLIANCE: keeps appointments   PHYSICAL EXAM:  BP 135/80   Pulse 96   Temp 98.3 F (36.8 C) (Tympanic)   Resp 16   Wt 246 lb (111.6 kg)   BMI 33.36 kg/m  Well-developed well-nourished patient in NAD. HEENT reveals PERLA, EOMI, discs not visualized.  Oral cavity is clear. No oral mucosal lesions are identified. Neck is clear without evidence of cervical or supraclavicular adenopathy. Lungs are clear to A&P. Cardiac examination is essentially unremarkable with regular rate and rhythm without murmur rub or thrill. Abdomen is benign with no organomegaly or masses noted. Motor sensory and DTR levels are equal and symmetric in the upper and lower extremities. Cranial nerves II through XII are grossly intact. Proprioception is intact. No peripheral adenopathy or edema is identified. No motor or sensory levels are noted. Crude visual fields are within normal range.  RADIOLOGY RESULTS: No current films for review  PLAN: Present time patient is doing well excellent biochemical results from his radiation therapy.  On pleased with his overall progress.  I have asked to see him back in 6 months with a repeat PSA.  Patient is to call with any concerns at any time.  I would like to take this opportunity to thank you for allowing me  to participate in the care of your patient.Carmina Miller, MD

## 2023-01-10 ENCOUNTER — Ambulatory Visit: Payer: Medicare HMO | Admitting: Podiatry

## 2023-01-10 ENCOUNTER — Encounter: Payer: Self-pay | Admitting: Podiatry

## 2023-01-10 VITALS — BP 153/83

## 2023-01-10 DIAGNOSIS — M79675 Pain in left toe(s): Secondary | ICD-10-CM | POA: Diagnosis not present

## 2023-01-10 DIAGNOSIS — B351 Tinea unguium: Secondary | ICD-10-CM | POA: Diagnosis not present

## 2023-01-10 DIAGNOSIS — E119 Type 2 diabetes mellitus without complications: Secondary | ICD-10-CM | POA: Diagnosis not present

## 2023-01-10 DIAGNOSIS — M79674 Pain in right toe(s): Secondary | ICD-10-CM

## 2023-01-10 NOTE — Progress Notes (Signed)
  Subjective:  Patient ID: Jeremy Andrews, male    DOB: 03-21-1956,  MRN: [id number]  Jeremy Andrews presents to clinic today for  Chief Complaint  Patient presents with   Nail Problem    DFC,Referring Provider Gweneth Dimitri, MD,LOV:01/24,A1C:6.1      New problem(s): None.   PCP is Mort Sawyers, FNP.  Allergies  Allergen Reactions   Lisinopril Swelling   Statins     Severe myopathy, in wheel chair Severe myopathy, in wheel chair    Review of Systems: Negative except as noted in the HPI. Objective:  There were no vitals filed for this visit.  Constitutional Jeremy Andrews is a pleasant 67 y.o. male, obese in NAD. AAO x 3.   Vascular Vascular Examination: Capillary refill time immediate b/l. Vascular status intact b/l with palpable pedal pulses. Pedal hair present b/l. No edema. No pain with calf compression b/l. Skin temperature gradient WNL b/l.   Neurological Examination: Sensation grossly intact b/l with 10 gram monofilament. Vibratory sensation intact b/l.   Dermatological Examination: Pedal skin with normal turgor, texture and tone b/l.  No open wounds. No interdigital macerations.   Toenails 1-5 b/l thick, discolored, elongated with subungual debris and pain on dorsal palpation.   No hyperkeratotic nor porokeratotic lesions present on today's visit.  Musculoskeletal Examination: Muscle strength 5/5 to all lower extremity muscle groups bilaterally. No pain, crepitus or joint limitation noted with ROM bilateral LE. No gross bony deformities bilaterally. Utilizes wheelchair for mobility assistance.  Radiographs: None  Last A1c:      Latest Ref Rng & Units 08/14/2022    8:46 AM 04/15/2022    9:36 AM  Hemoglobin A1C  Hemoglobin-A1c 4.6 - 6.5 % 6.1  5.9        Assessment:   1. Pain due to onychomycosis of toenails of both feet   2. Diabetes mellitus without complication (HCC)    Plan:  Patient was evaluated and treated and all questions answered. Consent given  for treatment as described below: Patient was evaluated and treated. All patient's and/or POA's questions/concerns addressed on today's visit. Toenails 1-5 debrided in length and girth without incident. Continue soft, supportive shoe gear daily. Report any pedal injuries to medical professional. Call office if there are any questions/concerns. -Patient/POA to call should there be question/concern in the interim.  Return in about 3 months (around 04/12/2023).  Freddie Breech, DPM

## 2023-01-17 ENCOUNTER — Telehealth: Payer: Self-pay

## 2023-01-17 ENCOUNTER — Encounter: Payer: Self-pay | Admitting: Podiatry

## 2023-01-17 NOTE — Telephone Encounter (Signed)
error 

## 2023-01-23 ENCOUNTER — Ambulatory Visit (INDEPENDENT_AMBULATORY_CARE_PROVIDER_SITE_OTHER): Payer: Medicare HMO

## 2023-01-23 VITALS — Ht 72.0 in | Wt 246.0 lb

## 2023-01-23 DIAGNOSIS — Z1211 Encounter for screening for malignant neoplasm of colon: Secondary | ICD-10-CM | POA: Diagnosis not present

## 2023-01-23 DIAGNOSIS — Z0000 Encounter for general adult medical examination without abnormal findings: Secondary | ICD-10-CM | POA: Diagnosis not present

## 2023-01-23 NOTE — Progress Notes (Signed)
Subjective:   Jeremy Andrews is a 67 y.o. male who presents for Medicare Annual/Subsequent preventive examination.  Visit Complete: Virtual  I connected with  Jeremy Andrews on 01/23/23 by a audio enabled telemedicine application and verified that I am speaking with the correct person using two identifiers.  Patient Location: Home  Provider Location: Home Office  I discussed the limitations of evaluation and management by telemedicine. The patient expressed understanding and agreed to proceed.   Review of Systems      Cardiac Risk Factors include: advanced age (>77men, >50 women);hypertension;diabetes mellitus;male gender;sedentary lifestyle;dyslipidemia     Objective:    Today's Vitals   01/23/23 0907  Weight: 246 lb (111.6 kg)  Height: 6' (1.829 m)   Body mass index is 33.36 kg/m.     01/23/2023    9:21 AM 01/01/2023   10:03 AM 10/02/2022   11:28 AM 05/21/2022   10:09 AM 01/22/2022    3:13 PM 11/01/2021   10:24 AM 02/16/2021    9:13 AM  Advanced Directives  Does Patient Have a Medical Advance Directive? No No No No No No No  Would patient like information on creating a medical advance directive? No - Patient declined No - Patient declined No - Patient declined No - Patient declined Yes (MAU/Ambulatory/Procedural Areas - Information given) No - Patient declined Yes (MAU/Ambulatory/Procedural Areas - Information given)    Current Medications (verified) Outpatient Encounter Medications as of 01/23/2023  Medication Sig   Alcohol Swabs (DROPSAFE ALCOHOL PREP) 70 % PADS USE UP TO FOUR TIMES DAILY AS DIRECTED   allopurinol (ZYLOPRIM) 100 MG tablet Take 1 tablet (100 mg total) by mouth daily.   amLODipine (NORVASC) 10 MG tablet TAKE 1 TABLET EVERY DAY   blood glucose meter kit and supplies KIT Dispense based on patient and insurance preference. Use up to four times daily as directed.   celecoxib (CELEBREX) 200 MG capsule TAKE 1 CAPSULE (200 MG TOTAL) BY MOUTH AS NEEDED FOR MILD  PAIN OR MODERATE PAIN.   cetirizine (ZYRTEC) 10 MG tablet TAKE 1 TABLET BY MOUTH EVERY DAY   empagliflozin (JARDIANCE) 10 MG TABS tablet TAKE 1 TABLET EVERY DAY BEFORE BREAKFAST   Ferrous Sulfate (IRON) 325 (65 Fe) MG TABS Take 1 tablet (325 mg total) by mouth daily.   labetalol (NORMODYNE) 100 MG tablet Take 0.5 tablets (50 mg total) by mouth 2 (two) times daily.   metFORMIN (GLUCOPHAGE) 500 MG tablet Take 2 tablets (1,000 mg total) by mouth 2 (two) times daily with a meal.   omeprazole (PRILOSEC) 20 MG capsule TAKE 1 CAPSULE EVERY DAY   rosuvastatin (CRESTOR) 10 MG tablet Take 1 tablet (10 mg total) by mouth daily.   TRUE METRIX BLOOD GLUCOSE TEST test strip USE  UP  TO FOUR TIMES DAILY AS DIRECTED   TRUEplus Lancets 33G MISC USE  UP  TO FOUR TIMES DAILY AS DIRECTED   tamsulosin (FLOMAX) 0.4 MG CAPS capsule TAKE 1 CAPSULE BY MOUTH EVERY DAY (Patient not taking: Reported on 01/23/2023)   No facility-administered encounter medications on file as of 01/23/2023.    Allergies (verified) Lisinopril and Statins   History: Past Medical History:  Diagnosis Date   Allergy    Colon polyps    Diabetes mellitus without complication (HCC)    Gout    Hyperlipidemia    Hypertension    Past Surgical History:  Procedure Laterality Date   COLONOSCOPY W/ POLYPECTOMY N/A    Family History  Problem Relation Age of  Onset   Diabetes Mother    Hypertension Mother    Gout Mother    Other Mother        meningioma   Hypertension Father    Kidney disease Sister    Hypertension Sister    Kidney failure Sister    Other Sister        legally blind   Diabetes Sister    Breast cancer Sister    Diabetes Brother    Arthritis Brother    Gout Brother    Breast cancer Maternal Grandmother 58   Social History   Socioeconomic History   Marital status: Married    Spouse name: Not on file   Number of children: 1   Years of education: 2 years of college   Highest education level: Not on file   Occupational History   Not on file  Tobacco Use   Smoking status: Former    Packs/day: 0.50    Years: 25.00    Additional pack years: 0.00    Total pack years: 12.50    Types: Cigarettes    Quit date: 1990    Years since quitting: 34.4   Smokeless tobacco: Never  Vaping Use   Vaping Use: Never used  Substance and Sexual Activity   Alcohol use: Yes    Alcohol/week: 2.0 - 3.0 standard drinks of alcohol    Types: 2 - 3 Cans of beer per week    Comment: 2-3 beers weekly   Drug use: Not Currently    Types: Marijuana    Comment: last time around 2000 or even before then   Sexual activity: Yes    Birth control/protection: None, Post-menopausal  Other Topics Concern   Not on file  Social History Narrative   02/09/19   From: the area   Living: still married but living seperately   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 Works good for them.       Family: one daughter - with CP in a group home, one son who has passed, and 3 step sons, separated from wife but good relationship, father also supportive      Enjoys: not much currently, restoring cars, tablet games      Exercise: not really, mowing the yard   Diet: does not follow a diabetic diet      Safety   Seat belts: Yes    Guns: No   Safe in relationships: Yes    Social Determinants of Health   Financial Resource Strain: Low Risk  (01/23/2023)   Overall Financial Resource Strain (CARDIA)    Difficulty of Paying Living Expenses: Not hard at all  Food Insecurity: No Food Insecurity (01/23/2023)   Hunger Vital Sign    Worried About Running Out of Food in the Last Year: Never true    Ran Out of Food in the Last Year: Never true  Transportation Needs: No Transportation Needs (01/23/2023)   PRAPARE - Administrator, Civil Service (Medical): No    Lack of Transportation (Non-Medical): No  Physical Activity: Inactive (01/23/2023)   Exercise Vital Sign    Days  of Exercise per Week: 0 days    Minutes of Exercise per Session: 0 min  Stress: No Stress Concern Present (01/23/2023)   [employer] of Occupational Health - Occupational Stress Questionnaire    Feeling of Stress : Not at all  Social Connections: Moderately Isolated (01/23/2023)  Social Advertising account executive [NHANES]    Frequency of Communication with Friends and Family: More than three times a week    Frequency of Social Gatherings with Friends and Family: More than three times a week    Attends Religious Services: Never    Database administrator or Organizations: No    Attends Engineer, structural: Never    Marital Status: Married    Tobacco Counseling Counseling given: Not Answered   Clinical Intake:  Pre-visit preparation completed: Yes  Pain : No/denies pain     Nutritional Risks: None Diabetes: Yes CBG done?: Yes (135 per pt) CBG resulted in Enter/ Edit results?: No Did pt. bring in CBG monitor from home?: No  How often do you need to have someone help you when you read instructions, pamphlets, or other written materials from your doctor or pharmacy?: 1 - Never  Interpreter Needed?: No  Information entered by :: C.Zayanna Pundt LPN   Activities of Daily Living    01/23/2023    9:21 AM  In your present state of health, do you have any difficulty performing the following activities:  Hearing? 0  Vision? 0  Difficulty concentrating or making decisions? 0  Walking or climbing stairs? 0  Dressing or bathing? 0  Doing errands, shopping? 0  Preparing Food and eating ? N  Using the Toilet? N  In the past six months, have you accidently leaked urine? N  Do you have problems with loss of bowel control? N  Managing your Medications? N  Managing your Finances? N  Housekeeping or managing your Housekeeping? N    Patient Care Team: Mort Sawyers, FNP as PCP - General (Family Medicine) Dewaine Conger, MD as Referring Physician  (Gastroenterology) Otho Ket, RN as Case Manager Kathyrn Sheriff, [facility] (Inactive) as Pharmacist (Pharmacist)  Indicate any recent Medical Services you may have received from other than [facility] providers in the past year (date may be approximate).     Assessment:   This is a routine wellness examination for [place].  Hearing/Vision screen Hearing Screening - Comments:: No hearing issues Vision Screening - Comments:: Readers - [facility]  Dietary issues and exercise activities discussed:     Goals Addressed             This Visit's Progress    Patient Stated       Lose 50 pounds       Depression Screen    01/22/2022    3:12 PM 11/01/2021   10:34 AM 02/16/2021    9:22 AM 10/31/2020   10:35 AM 02/10/2019    9:45 AM  PHQ 2/9 Scores  PHQ - 2 Score 0 0 0 0 1  PHQ- 9 Score    0 4    Fall Risk    01/23/2023    9:11 AM 03/21/2022   12:33 PM 03/14/2022    4:26 PM 03/07/2022   11:54 AM 01/22/2022    3:12 PM  Fall Risk   Falls in the past year? 0 0 0 0 0  Number falls in past yr: 0      Injury with Fall? 0      Risk for fall due to : No Fall Risks      Follow up Falls prevention discussed;Falls evaluation completed        MEDICARE RISK AT HOME:     Cognitive Function:    10/31/2020   10:38 AM  MMSE - Mini Mental State Exam  Orientation to time 5  Orientation to Place 5  Registration 3  Attention/ Calculation 5  Recall 3  Language- repeat 1        01/23/2023    9:24 AM 11/01/2021   12:00 PM  6CIT Screen  What Year? 0 points 0 points  What month? 0 points 0 points  What time? 0 points 0 points  Count back from 20 0 points 0 points  Months in reverse 0 points 0 points  Repeat phrase 2 points 0 points  Total Score 2 points 0 points    Immunizations Immunization History  Administered Date(s) Administered   PFIZER Comirnaty(Gray Top)Covid-19 Tri-Sucrose Vaccine 09/26/2020   PFIZER(Purple Top)SARS-COV-2 Vaccination 09/28/2019, 10/19/2019    Pneumococcal Conjugate-13 03/07/2017   Pneumococcal Polysaccharide-23 08/31/2019   Tdap 03/07/2017    TDAP status: Up to date  Flu Vaccine status: Declined, Education has been provided regarding the importance of this vaccine but patient still declined. Advised may receive this vaccine at local pharmacy or Health Dept. Aware to provide a copy of the vaccination record if obtained from local pharmacy or Health Dept. Verbalized acceptance and understanding.  Pneumococcal vaccine status: Up to date  Covid-19 vaccine status: Information provided on how to obtain vaccines.   Qualifies for Shingles Vaccine? Yes   Zostavax completed No   Shingrix Completed?: No.    Education has been provided regarding the importance of this vaccine. Patient has been advised to call insurance company to determine out of pocket expense if they have not yet received this vaccine. Advised may also receive vaccine at local pharmacy or Health Dept. Verbalized acceptance and understanding.  Screening Tests Health Maintenance  Topic Date Due   Zoster Vaccines- Shingrix (1 of 2) Never done   Colonoscopy  03/10/2022   COVID-19 Vaccine (4 - 2023-24 season) 04/05/2022   Diabetic kidney evaluation - Urine ACR  01/09/2023   HEMOGLOBIN A1C  02/12/2023   FOOT EXAM  07/26/2023   Diabetic kidney evaluation - eGFR measurement  08/15/2023   OPHTHALMOLOGY EXAM  11/28/2023   Medicare Annual Wellness (AWV)  01/23/2024   DTaP/Tdap/Td (2 - Td or Tdap) 03/08/2027   Pneumonia Vaccine 32+ Years old  Completed   Hepatitis C Screening  Completed   HPV VACCINES  Aged Out   INFLUENZA VACCINE  Discontinued    Health Maintenance  Health Maintenance Due  Topic Date Due   Zoster Vaccines- Shingrix (1 of 2) Never done   Colonoscopy  03/10/2022   COVID-19 Vaccine (4 - 2023-24 season) 04/05/2022   Diabetic kidney evaluation - Urine ACR  01/09/2023    Colorectal cancer screening: Type of screening: Colonoscopy. Completed 03/11/2019.  Repeat every 3 years order placed today.  Lung Cancer Screening: (Low Dose CT Chest recommended if Age 54-80 years, 20 pack-year currently smoking OR have quit w/in 15years.) does not qualify.   Lung Cancer Screening Referral: no  Additional Screening:  Hepatitis C Screening: does qualify; Completed 09/21/18  Vision Screening: Recommended annual ophthalmology exams for early detection of glaucoma and other disorders of the eye. Is the patient up to date with their annual eye exam?   has appointment 02/10/23. Who is the provider or what is the name of the office in which the patient attends annual eye exams? [facility] If pt is not established with a provider, would they like to be referred to a provider to establish care? Yes .   Dental Screening: Recommended annual dental exams for proper oral hygiene  Diabetic Foot  Exam: Diabetic Foot Exam: Completed 07/25/22 Sees podiatrist.  Community Resource Referral / Chronic Care Management: CRR required this visit?  No   CCM required this visit?  No     Plan:     I have personally reviewed and noted the following in the patient's chart:   Medical and social history Use of alcohol, tobacco or illicit drugs  Current medications and supplements including opioid prescriptions. Patient is not currently taking opioid prescriptions. Functional ability and status Nutritional status Physical activity Advanced directives List of other physicians Hospitalizations, surgeries, and ER visits in previous 12 months Vitals Screenings to include cognitive, depression, and falls Referrals and appointments  In addition, I have reviewed and discussed with patient certain preventive protocols, quality metrics, and best practice recommendations. A written personalized care plan for preventive services as well as general preventive health recommendations were provided to patient.     Maryan Puls, LPN   11/11/8117   After Visit Summary:  (MyChart) Due to this being a telephonic visit, the after visit summary with patients personalized plan was offered to patient via MyChart   Nurse Notes: Order placed for colonoscopy.

## 2023-01-23 NOTE — Patient Instructions (Signed)
[name] , Thank you for taking time to come for your Medicare Wellness Visit. I appreciate your ongoing commitment to your health goals. Please review the following plan we discussed and let me know if I can assist you in the future.   These are the goals we discussed:  Goals      Patient Stated     10/31/2020, I will maintain and continue medications as prescribed.      Patient Stated     Patient Stated     Lose 50 pounds        This is a list of the screening recommended for you and due dates:  Health Maintenance  Topic Date Due   Zoster (Shingles) Vaccine (1 of 2) Never done   Colon Cancer Screening  03/10/2022   COVID-19 Vaccine (4 - 2023-24 season) 04/05/2022   Yearly kidney health urinalysis for diabetes  01/09/2023   Hemoglobin A1C  02/12/2023   Complete foot exam   07/26/2023   Yearly kidney function blood test for diabetes  08/15/2023   Eye exam for diabetics  11/28/2023   Medicare Annual Wellness Visit  01/23/2024   DTaP/Tdap/Td vaccine (2 - Td or Tdap) 03/08/2027   Pneumonia Vaccine  Completed   Hepatitis C Screening  Completed   HPV Vaccine  Aged Out   Flu Shot  Discontinued    Advanced directives: none  Conditions/risks identified: Aim for 30 minutes of exercise or brisk walking, 6-8 glasses of water, and 5 servings of fruits and vegetables each day.   Next appointment: Follow up in one year for your annual wellness visit. 01/26/24 @ 11:15 telephone  Preventive Care 65 Years and Older, Male  Preventive care refers to lifestyle choices and visits with your health care provider that can promote health and wellness. What does preventive care include? A yearly physical exam. This is also called an annual well check. Dental exams once or twice a year. Routine eye exams. Ask your health care provider how often you should have your eyes checked. Personal lifestyle choices, including: Daily care of your teeth and gums. Regular physical activity. Eating a  healthy diet. Avoiding tobacco and drug use. Limiting alcohol use. Practicing safe sex. Taking low doses of aspirin every day. Taking vitamin and mineral supplements as recommended by your health care provider. What happens during an annual well check? The services and screenings done by your health care provider during your annual well check will depend on your age, overall health, lifestyle risk factors, and family history of disease. Counseling  Your health care provider may ask you questions about your: Alcohol use. Tobacco use. Drug use. Emotional well-being. Home and relationship well-being. Sexual activity. Eating habits. History of falls. Memory and ability to understand (cognition). Work and work Astronomer. Screening  You may have the following tests or measurements: Height, weight, and BMI. Blood pressure. Lipid and cholesterol levels. These may be checked every 5 years, or more frequently if you are over 42 years old. Skin check. Lung cancer screening. You may have this screening every year starting at age 65 if you have a 30-pack-year history of smoking and currently smoke or have quit within the past 15 years. Fecal occult blood test (FOBT) of the stool. You may have this test every year starting at age 2. Flexible sigmoidoscopy or colonoscopy. You may have a sigmoidoscopy every 5 years or a colonoscopy every 10 years starting at age 29. Prostate cancer screening. Recommendations will vary depending on your family  history and other risks. Hepatitis C blood test. Hepatitis B blood test. Sexually transmitted disease (STD) testing. Diabetes screening. This is done by checking your blood sugar (glucose) after you have not eaten for a while (fasting). You may have this done every 1-3 years. Abdominal aortic aneurysm (AAA) screening. You may need this if you are a current or former smoker. Osteoporosis. You may be screened starting at age 55 if you are at high risk. Talk  with your health care provider about your test results, treatment options, and if necessary, the need for more tests. Vaccines  Your health care provider may recommend certain vaccines, such as: Influenza vaccine. This is recommended every year. Tetanus, diphtheria, and acellular pertussis (Tdap, Td) vaccine. You may need a Td booster every 10 years. Zoster vaccine. You may need this after age 58. Pneumococcal 13-valent conjugate (PCV13) vaccine. One dose is recommended after age 78. Pneumococcal polysaccharide (PPSV23) vaccine. One dose is recommended after age 67. Talk to your health care provider about which screenings and vaccines you need and how often you need them. This information is not intended to replace advice given to you by your health care provider. Make sure you discuss any questions you have with your health care provider. Document Released: 08/18/2015 Document Revised: 04/10/2016 Document Reviewed: 05/23/2015 Elsevier Interactive Patient Education  2017 [place] Prevention in the Home Falls can cause injuries. They can happen to people of all ages. There are many things you can do to make your home safe and to help prevent falls. What can I do on the outside of my home? Regularly fix the edges of walkways and driveways and fix any cracks. Remove anything that might make you trip as you walk through a door, such as a raised step or threshold. Trim any bushes or trees on the path to your home. Use bright outdoor lighting. Clear any walking paths of anything that might make someone trip, such as rocks or tools. Regularly check to see if handrails are loose or broken. Make sure that both sides of any steps have handrails. Any raised decks and porches should have guardrails on the edges. Have any leaves, snow, or ice cleared regularly. Use sand or salt on walking paths during winter. Clean up any spills in your garage right away. This includes oil or grease  spills. What can I do in the bathroom? Use night lights. Install grab bars by the toilet and in the tub and shower. Do not use towel bars as grab bars. Use non-skid mats or decals in the tub or shower. If you need to sit down in the shower, use a plastic, non-slip stool. Keep the floor dry. Clean up any water that spills on the floor as soon as it happens. Remove soap buildup in the tub or shower regularly. Attach bath mats securely with double-sided non-slip rug tape. Do not have throw rugs and other things on the floor that can make you trip. What can I do in the bedroom? Use night lights. Make sure that you have a light by your bed that is easy to reach. Do not use any sheets or blankets that are too big for your bed. They should not hang down onto the floor. Have a firm chair that has side arms. You can use this for support while you get dressed. Do not have throw rugs and other things on the floor that can make you trip. What can I do in the kitchen? Clean up any  spills right away. Avoid walking on wet floors. Keep items that you use a lot in easy-to-reach places. If you need to reach something above you, use a strong step stool that has a grab bar. Keep electrical cords out of the way. Do not use floor polish or wax that makes floors slippery. If you must use wax, use non-skid floor wax. Do not have throw rugs and other things on the floor that can make you trip. What can I do with my stairs? Do not leave any items on the stairs. Make sure that there are handrails on both sides of the stairs and use them. Fix handrails that are broken or loose. Make sure that handrails are as long as the stairways. Check any carpeting to make sure that it is firmly attached to the stairs. Fix any carpet that is loose or worn. Avoid having throw rugs at the top or bottom of the stairs. If you do have throw rugs, attach them to the floor with carpet tape. Make sure that you have a light switch at the  top of the stairs and the bottom of the stairs. If you do not have them, ask someone to add them for you. What else can I do to help prevent falls? Wear shoes that: Do not have high heels. Have rubber bottoms. Are comfortable and fit you well. Are closed at the toe. Do not wear sandals. If you use a stepladder: Make sure that it is fully opened. Do not climb a closed stepladder. Make sure that both sides of the stepladder are locked into place. Ask someone to hold it for you, if possible. Clearly mark and make sure that you can see: Any grab bars or handrails. First and last steps. Where the edge of each step is. Use tools that help you move around (mobility aids) if they are needed. These include: Canes. Walkers. Scooters. Crutches. Turn on the lights when you go into a dark area. Replace any light bulbs as soon as they burn out. Set up your furniture so you have a clear path. Avoid moving your furniture around. If any of your floors are uneven, fix them. If there are any pets around you, be aware of where they are. Review your medicines with your doctor. Some medicines can make you feel dizzy. This can increase your chance of falling. Ask your doctor what other things that you can do to help prevent falls. This information is not intended to replace advice given to you by your health care provider. Make sure you discuss any questions you have with your health care provider. Document Released: 05/18/2009 Document Revised: 12/28/2015 Document Reviewed: 08/26/2014 Elsevier Interactive Patient Education  2017 [employer].

## 2023-02-04 ENCOUNTER — Ambulatory Visit: Payer: Medicare HMO | Admitting: Family

## 2023-02-10 DIAGNOSIS — Z0100 Encounter for examination of eyes and vision without abnormal findings: Secondary | ICD-10-CM | POA: Diagnosis not present

## 2023-02-10 DIAGNOSIS — H2513 Age-related nuclear cataract, bilateral: Secondary | ICD-10-CM | POA: Diagnosis not present

## 2023-02-10 DIAGNOSIS — E119 Type 2 diabetes mellitus without complications: Secondary | ICD-10-CM | POA: Diagnosis not present

## 2023-02-13 ENCOUNTER — Ambulatory Visit (INDEPENDENT_AMBULATORY_CARE_PROVIDER_SITE_OTHER): Payer: Medicare HMO | Admitting: Family

## 2023-02-13 ENCOUNTER — Encounter: Payer: Self-pay | Admitting: Family

## 2023-02-13 VITALS — BP 134/78 | HR 90 | Temp 97.2°F | Ht 71.0 in | Wt 245.0 lb

## 2023-02-13 DIAGNOSIS — I10 Essential (primary) hypertension: Secondary | ICD-10-CM | POA: Diagnosis not present

## 2023-02-13 DIAGNOSIS — Z7984 Long term (current) use of oral hypoglycemic drugs: Secondary | ICD-10-CM | POA: Diagnosis not present

## 2023-02-13 DIAGNOSIS — K219 Gastro-esophageal reflux disease without esophagitis: Secondary | ICD-10-CM | POA: Diagnosis not present

## 2023-02-13 DIAGNOSIS — M1A0790 Idiopathic chronic gout, unspecified ankle and foot, without tophus (tophi): Secondary | ICD-10-CM

## 2023-02-13 DIAGNOSIS — E1169 Type 2 diabetes mellitus with other specified complication: Secondary | ICD-10-CM

## 2023-02-13 DIAGNOSIS — E785 Hyperlipidemia, unspecified: Secondary | ICD-10-CM | POA: Diagnosis not present

## 2023-02-13 LAB — POCT GLYCOSYLATED HEMOGLOBIN (HGB A1C): Hemoglobin A1C: 6.7 % — AB (ref 4.0–5.6)

## 2023-02-13 MED ORDER — ROSUVASTATIN CALCIUM 10 MG PO TABS
ORAL_TABLET | ORAL | Status: DC
Start: 2023-02-13 — End: 2023-09-11

## 2023-02-13 MED ORDER — EMPAGLIFLOZIN 25 MG PO TABS: 25.0000 mg | ORAL_TABLET | Freq: Every day | ORAL | 3 refills | Status: DC

## 2023-02-13 NOTE — Progress Notes (Signed)
Established Patient Office Visit  Subjective:      CC:  Chief Complaint  Patient presents with   Medical Management of Chronic Issues    Here for 6 mo DM f/u. Also, wants to discuss rosuvastatin. Directions on bottle say once daily, but was told to take wkly.     HPI: Jeremy Andrews is a 67 y.o. male presenting on 02/13/2023 for Medical Management of Chronic Issues (Here for 6 mo DM f/u. Also, wants to discuss rosuvastatin. Directions on bottle say once daily, but was told to take wkly. ) . Diabetes: increased this visit to 6.7 , was 6.1 prior.  Does eat sweets here and there, not too often. Does find eating a bit more sweets than normal. Tries to stay away from fried foods. Cooks in air fryer instead. Does eat rice on a fairly regular basis, not pasta often. One biscuit a day on average.  Lab Results  Component Value Date   HGBA1C 6.7 (A) 02/13/2023    HLD: on rosuvastatin 10 mg once daily however he states he is taking once weekly.  Lab Results  Component Value Date   CHOL 176 11/18/2022   HDL 44.30 11/18/2022   LDLCALC 98 11/18/2022   TRIG 165.0 (H) 11/18/2022   CHOLHDL 4 11/18/2022   Gout: no recent gout flares, still taking allopurinol 100 mg once daily  Uric acid 5.2 back in October   Recently saw eye doctor for mild non-proliferative diabetic retinopathy he states recent visit with eye doctor they stated he does not have this?       Social history:  Relevant past medical, surgical, family and social history reviewed and updated as indicated. Interim medical history since our last visit reviewed.  Allergies and medications reviewed and updated.  DATA REVIEWED: CHART IN EPIC     ROS: Negative unless specifically indicated above in HPI.    Current Outpatient Medications:    Alcohol Swabs (DROPSAFE ALCOHOL PREP) 70 % PADS, USE UP TO FOUR TIMES DAILY AS DIRECTED, Disp: 300 each, Rfl: 3   allopurinol (ZYLOPRIM) 100 MG tablet, Take 1 tablet (100 mg total) by  mouth daily., Disp: 90 tablet, Rfl: 3   amLODipine (NORVASC) 10 MG tablet, TAKE 1 TABLET EVERY DAY, Disp: 90 tablet, Rfl: 3   celecoxib (CELEBREX) 200 MG capsule, TAKE 1 CAPSULE (200 MG TOTAL) BY MOUTH AS NEEDED FOR MILD PAIN OR MODERATE PAIN., Disp: 30 capsule, Rfl: 0   cetirizine (ZYRTEC) 10 MG tablet, TAKE 1 TABLET BY MOUTH EVERY DAY, Disp: 90 tablet, Rfl: 3   empagliflozin (JARDIANCE) 25 MG TABS tablet, Take 1 tablet (25 mg total) by mouth daily before breakfast., Disp: 90 tablet, Rfl: 3   Ferrous Sulfate (IRON) 325 (65 Fe) MG TABS, Take 1 tablet (325 mg total) by mouth daily., Disp: 30 tablet, Rfl: 3   labetalol (NORMODYNE) 100 MG tablet, Take 0.5 tablets (50 mg total) by mouth 2 (two) times daily., Disp: 90 tablet, Rfl: 1   metFORMIN (GLUCOPHAGE) 500 MG tablet, Take 2 tablets (1,000 mg total) by mouth 2 (two) times daily with a meal., Disp: 360 tablet, Rfl: 0   omeprazole (PRILOSEC) 20 MG capsule, TAKE 1 CAPSULE EVERY DAY, Disp: 90 capsule, Rfl: 3   TRUE METRIX BLOOD GLUCOSE TEST test strip, USE  UP  TO FOUR TIMES DAILY AS DIRECTED, Disp: 100 strip, Rfl: 1   TRUEplus Lancets 33G MISC, USE  UP  TO FOUR TIMES DAILY AS DIRECTED, Disp: 100 each, Rfl: 1  rosuvastatin (CRESTOR) 10 MG tablet, Take one tablet twice weekly, Disp: , Rfl:       Objective:    BP 134/78   Pulse 90   Temp (!) 97.2 F (36.2 C) (Temporal)   Ht 5\' 11"  (1.803 m)   Wt 245 lb (111.1 kg)   SpO2 97%   BMI 34.17 kg/m   Wt Readings from Last 3 Encounters:  02/13/23 245 lb (111.1 kg)  01/23/23 246 lb (111.6 kg)  01/01/23 246 lb (111.6 kg)    Physical Exam Constitutional:      General: He is not in acute distress.    Appearance: Normal appearance. He is normal weight. He is not ill-appearing, toxic-appearing or diaphoretic.  Cardiovascular:     Rate and Rhythm: Normal rate and regular rhythm.  Pulmonary:     Effort: Pulmonary effort is normal.  Musculoskeletal:        General: Normal range of motion.   Neurological:     General: No focal deficit present.     Mental Status: He is alert and oriented to person, place, and time. Mental status is at baseline.  Psychiatric:        Mood and Affect: Mood normal.        Behavior: Behavior normal.        Thought Content: Thought content normal.        Judgment: Judgment normal.           Assessment & Plan:  Type 2 diabetes mellitus with other specified complication, without long-term current use of insulin (HCC) Assessment & Plan: Worsening a bit.  Increase jardiance to 25 once daily  Advised to cut back on sweets, opt for sugar free. And also work on serving size for carbs such as rice.   Orders: -     POCT glycosylated hemoglobin (Hb A1C) -     Empagliflozin; Take 1 tablet (25 mg total) by mouth daily before breakfast.  Dispense: 90 tablet; Refill: 3 -     Microalbumin / creatinine urine ratio  Hyperlipidemia, unspecified hyperlipidemia type Assessment & Plan: Work on low cholesterol diet and exercise as tolerated Advised pt to increase rosuvastatin to 10 mg twice a week instead of once weekly  Orders: -     Rosuvastatin Calcium; Take one tablet twice weekly  Gastroesophageal reflux disease, unspecified whether esophagitis present Assessment & Plan: Try to decrease and or avoid spicy foods, fried fatty foods, and also caffeine and chocolate as these can increase heartburn symptoms.     Essential hypertension Assessment & Plan: stable   Chronic gout of foot, unspecified cause, unspecified laterality Assessment & Plan: Stable. Continue allopurinol 100 mg once daily       Return in about 6 months (around 08/16/2023) for f/u CPE.  Mort Sawyers, MSN, APRN, FNP-C [facility]

## 2023-02-13 NOTE — Assessment & Plan Note (Signed)
Stable. Continue allopurinol 100 mg once daily

## 2023-02-13 NOTE — Patient Instructions (Signed)
  Increase jardiance to 25 mg once daily Increase rosuvastatin to two times a week.    Regards,   Mort Sawyers FNP-C

## 2023-02-13 NOTE — Assessment & Plan Note (Signed)
Worsening a bit.  Increase jardiance to 25 once daily  Advised to cut back on sweets, opt for sugar free. And also work on serving size for carbs such as rice.

## 2023-02-13 NOTE — Assessment & Plan Note (Signed)
Work on low cholesterol diet and exercise as tolerated Advised pt to increase rosuvastatin to 10 mg twice a week instead of once weekly

## 2023-02-13 NOTE — Assessment & Plan Note (Signed)
stable °

## 2023-02-13 NOTE — Assessment & Plan Note (Signed)
Try to decrease and or avoid spicy foods, fried fatty foods, and also caffeine and chocolate as these can increase heartburn symptoms.   

## 2023-02-18 NOTE — Addendum Note (Signed)
Addended by: Alvina Chou on: 02/18/2023 02:39 PM   Modules accepted: Orders

## 2023-03-03 ENCOUNTER — Telehealth: Payer: Self-pay

## 2023-03-03 ENCOUNTER — Telehealth: Payer: Self-pay | Admitting: Gastroenterology

## 2023-03-03 MED ORDER — CELECOXIB 200 MG PO CAPS: 200.0000 mg | ORAL_CAPSULE | ORAL | 0 refills | Status: DC | PRN

## 2023-03-03 NOTE — Telephone Encounter (Signed)
Hi Dr. Chales Abrahams,   Supervising Provider: 03/03/23-PM  We received a referral for patient to have a colonoscopy. He does have GI history with [facility] had a colonoscopy done in 2019. The patient is requesting a transfer due to location. Reports are available in Epic for you to review and advise on scheduling.    Thanks

## 2023-03-03 NOTE — Telephone Encounter (Signed)
Refilled

## 2023-03-03 NOTE — Telephone Encounter (Signed)
Received refill request for patient. Was recently started back on. Ok to refill?

## 2023-03-04 NOTE — Telephone Encounter (Signed)
several previous GI records reviewed  he is in good hands with Dr. Georges Mouse at [facility] and  should have colonoscopy done there (looks like it was  quite challenging d/t  torturous colon). They know him well  RG

## 2023-03-06 NOTE — Telephone Encounter (Signed)
Inbound call from patient regarding request. Patient has been advised. Patient will call Dr. Josetta Huddle to schedule.

## 2023-03-13 ENCOUNTER — Other Ambulatory Visit: Payer: Self-pay

## 2023-03-13 NOTE — Telephone Encounter (Signed)
Faxed refill request for cetirizine 10mg .  Pt last seen 02/13/23, next appointment 08/18/23.

## 2023-03-14 MED ORDER — CETIRIZINE HCL 10 MG PO TABS: 10.0000 mg | ORAL_TABLET | Freq: Every day | ORAL | 3 refills | Status: DC

## 2023-03-14 NOTE — Telephone Encounter (Signed)
Patient contacted the office regarding this refill request, asking for an update

## 2023-03-17 ENCOUNTER — Other Ambulatory Visit: Payer: Self-pay | Admitting: Family

## 2023-03-17 DIAGNOSIS — E1169 Type 2 diabetes mellitus with other specified complication: Secondary | ICD-10-CM

## 2023-03-17 MED ORDER — ALLOPURINOL 100 MG PO TABS: 100.0000 mg | ORAL_TABLET | Freq: Every day | ORAL | 3 refills | Status: AC

## 2023-04-01 ENCOUNTER — Other Ambulatory Visit: Payer: Self-pay | Admitting: Family

## 2023-04-01 MED ORDER — CELECOXIB 200 MG PO CAPS: 200.0000 mg | ORAL_CAPSULE | ORAL | 3 refills | Status: DC | PRN

## 2023-04-01 NOTE — Telephone Encounter (Signed)
Patient called in stating that [facility] said that they never received his refill of medicationcelecoxib (CELEBREX) 200 MG capsule that was sent in on 03/03/23.He would like to know if it can be resent?

## 2023-04-11 ENCOUNTER — Other Ambulatory Visit: Payer: Self-pay | Admitting: Family

## 2023-04-15 ENCOUNTER — Telehealth: Payer: Self-pay | Admitting: Family

## 2023-04-15 NOTE — Telephone Encounter (Signed)
Prescription Request  04/15/2023  LOV: 02/13/2023  What is the name of the medication or equipment? celecoxib (CELEBREX) 200 MG capsule   Have you contacted your pharmacy to request a refill? Yes   Which pharmacy would you like this sent to?   [facility] - [place], [place] - [address] [place] [address] Phone: [phone] Fax: [phone]    Patient notified that their request is being sent to the clinical staff for review and that they should receive a response within 2 business days.   Please advise at Mobile [phone] (mobile)

## 2023-05-12 ENCOUNTER — Ambulatory Visit: Payer: Medicare HMO | Admitting: Podiatry

## 2023-05-12 ENCOUNTER — Encounter: Payer: Self-pay | Admitting: Podiatry

## 2023-05-12 DIAGNOSIS — M79675 Pain in left toe(s): Secondary | ICD-10-CM

## 2023-05-12 DIAGNOSIS — E119 Type 2 diabetes mellitus without complications: Secondary | ICD-10-CM | POA: Diagnosis not present

## 2023-05-12 DIAGNOSIS — B351 Tinea unguium: Secondary | ICD-10-CM | POA: Diagnosis not present

## 2023-05-12 DIAGNOSIS — M79674 Pain in right toe(s): Secondary | ICD-10-CM

## 2023-05-12 NOTE — Progress Notes (Signed)
Subjective:  Patient ID: Jeremy Andrews, male    DOB: Dec 12, 1955,  MRN: [id number]  67 y.o. male presents preventative diabetic foot care and painful thick toenails that are difficult to trim. Pain interferes with ambulation. Aggravating factors include wearing enclosed shoe gear. Pain is relieved with periodic professional debridement.  New problem(s): None   PCP is Mort Sawyers, FNP , and last visit was February 13, 2023.  Allergies  Allergen Reactions   Lisinopril Swelling   Statins     Severe myopathy, in wheel chair Severe myopathy, in wheel chair    Review of Systems: Negative except as noted in the HPI.   Objective:  Jeremy Andrews is a pleasant 67 y.o. male obese in NAD.Marland Kitchen AAO x 3.  Vascular Examination: Vascular status intact b/l with palpable pedal pulses. CFT immediate b/l. Pedal hair present. No edema. No pain with calf compression b/l. Skin temperature gradient WNL b/l. No varicosities noted. No cyanosis or clubbing noted.  Neurological Examination: Sensation grossly intact b/l with 10 gram monofilament. Vibratory sensation intact b/l.  Dermatological Examination: Pedal skin with normal turgor, texture and tone b/l. No open wounds nor interdigital macerations noted. Toenails 1-5 b/l thick, discolored, elongated with subungual debris and pain on dorsal palpation. Minimal HKT b/l great toes.  Musculoskeletal Examination: Muscle strength 5/5 to b/l LE.  No pain, crepitus noted b/l. No gross pedal deformities. Patient ambulates independently without assistive aids.   Radiographs: None  Last A1c:      Latest Ref Rng & Units 02/13/2023    8:48 AM 08/14/2022    8:46 AM  Hemoglobin A1C  Hemoglobin-A1c 4.0 - 5.6 % 6.7  6.1     Assessment:   1. Pain due to onychomycosis of toenails of both feet   2. Diabetes mellitus without complication (HCC)    Plan:  -Consent given for treatment as described below: -Examined patient. -Continue foot and shoe inspections daily. Monitor  blood glucose per PCP/Endocrinologist's recommendations. -Continue supportive shoe gear daily. -Mycotic toenails 1-5 bilaterally were debrided in length and girth with sterile nail nippers and dremel without incident. -Patient/POA to call should there be question/concern in the interim.  Return in about 3 months (around 08/12/2023).  Freddie Breech, DPM

## 2023-05-21 ENCOUNTER — Other Ambulatory Visit: Payer: Self-pay | Admitting: Family

## 2023-05-22 DIAGNOSIS — Z1211 Encounter for screening for malignant neoplasm of colon: Secondary | ICD-10-CM | POA: Diagnosis not present

## 2023-05-22 DIAGNOSIS — K635 Polyp of colon: Secondary | ICD-10-CM | POA: Diagnosis not present

## 2023-05-22 DIAGNOSIS — M1990 Unspecified osteoarthritis, unspecified site: Secondary | ICD-10-CM | POA: Diagnosis not present

## 2023-05-22 DIAGNOSIS — D123 Benign neoplasm of transverse colon: Secondary | ICD-10-CM | POA: Diagnosis not present

## 2023-05-22 DIAGNOSIS — Z860101 Personal history of adenomatous and serrated colon polyps: Secondary | ICD-10-CM | POA: Diagnosis not present

## 2023-05-22 DIAGNOSIS — M109 Gout, unspecified: Secondary | ICD-10-CM | POA: Diagnosis not present

## 2023-05-22 DIAGNOSIS — I10 Essential (primary) hypertension: Secondary | ICD-10-CM | POA: Diagnosis not present

## 2023-05-22 DIAGNOSIS — D125 Benign neoplasm of sigmoid colon: Secondary | ICD-10-CM | POA: Diagnosis not present

## 2023-05-22 DIAGNOSIS — E119 Type 2 diabetes mellitus without complications: Secondary | ICD-10-CM | POA: Diagnosis not present

## 2023-05-22 DIAGNOSIS — K6389 Other specified diseases of intestine: Secondary | ICD-10-CM | POA: Diagnosis not present

## 2023-05-22 DIAGNOSIS — Z791 Long term (current) use of non-steroidal anti-inflammatories (NSAID): Secondary | ICD-10-CM | POA: Diagnosis not present

## 2023-05-22 DIAGNOSIS — K6289 Other specified diseases of anus and rectum: Secondary | ICD-10-CM | POA: Diagnosis not present

## 2023-05-28 DIAGNOSIS — Z7984 Long term (current) use of oral hypoglycemic drugs: Secondary | ICD-10-CM | POA: Diagnosis not present

## 2023-05-28 DIAGNOSIS — M159 Polyosteoarthritis, unspecified: Secondary | ICD-10-CM | POA: Diagnosis not present

## 2023-05-28 DIAGNOSIS — Z23 Encounter for immunization: Secondary | ICD-10-CM | POA: Diagnosis not present

## 2023-05-28 DIAGNOSIS — Z5181 Encounter for therapeutic drug level monitoring: Secondary | ICD-10-CM | POA: Diagnosis not present

## 2023-05-28 DIAGNOSIS — Z79899 Other long term (current) drug therapy: Secondary | ICD-10-CM | POA: Diagnosis not present

## 2023-05-28 DIAGNOSIS — E119 Type 2 diabetes mellitus without complications: Secondary | ICD-10-CM | POA: Diagnosis not present

## 2023-05-28 DIAGNOSIS — Z791 Long term (current) use of non-steroidal anti-inflammatories (NSAID): Secondary | ICD-10-CM | POA: Diagnosis not present

## 2023-05-28 DIAGNOSIS — I10 Essential (primary) hypertension: Secondary | ICD-10-CM | POA: Diagnosis not present

## 2023-05-28 DIAGNOSIS — M1A09X0 Idiopathic chronic gout, multiple sites, without tophus (tophi): Secondary | ICD-10-CM | POA: Diagnosis not present

## 2023-05-28 DIAGNOSIS — M1000 Idiopathic gout, unspecified site: Secondary | ICD-10-CM | POA: Diagnosis not present

## 2023-06-19 ENCOUNTER — Inpatient Hospital Stay: Payer: Medicare HMO | Attending: Radiation Oncology

## 2023-06-19 DIAGNOSIS — C61 Malignant neoplasm of prostate: Secondary | ICD-10-CM | POA: Diagnosis not present

## 2023-06-19 LAB — PSA: Prostatic Specific Antigen: 0.17 ng/mL (ref 0.00–4.00)

## 2023-06-26 ENCOUNTER — Encounter: Payer: Self-pay | Admitting: Radiation Oncology

## 2023-06-26 ENCOUNTER — Ambulatory Visit
Admission: RE | Admit: 2023-06-26 | Discharge: 2023-06-26 | Disposition: A | Discharge status: Home / Self Care | Payer: Medicare HMO | Source: Ambulatory Visit | Attending: Radiation Oncology | Admitting: Radiation Oncology

## 2023-06-26 VITALS — BP 110/64 | HR 87 | Temp 97.6°F | Resp 18 | Ht 71.0 in | Wt 252.7 lb

## 2023-06-26 DIAGNOSIS — Z923 Personal history of irradiation: Secondary | ICD-10-CM | POA: Insufficient documentation

## 2023-06-26 DIAGNOSIS — R5383 Other fatigue: Secondary | ICD-10-CM | POA: Insufficient documentation

## 2023-06-26 DIAGNOSIS — Z191 Hormone sensitive malignancy status: Secondary | ICD-10-CM | POA: Diagnosis not present

## 2023-06-26 DIAGNOSIS — C61 Malignant neoplasm of prostate: Secondary | ICD-10-CM | POA: Diagnosis not present

## 2023-06-26 NOTE — Progress Notes (Signed)
Radiation Oncology Follow up Note  Name: Jeremy Andrews   Date:   06/26/2023 MRN:  [id number] DOB: 29-Dec-1955    This 67 y.o. 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 He reports feeling well overall. His PSA level has slightly increased from 0.06 to 0.17 over the past six months. The stools are usually well-formed, occasionally loose. He denies any pain. He also reports a decrease in energy levels, which he attributes to a previous Eligard injection. He has been advised to increase physical activity to improve energy levels. He has been taking over-the-counter calcium with vitamin D supplements on the advice of a nurse who administered a previous treatment..  COMPLICATIONS OF TREATMENT: none  FOLLOW UP COMPLIANCE: keeps appointments   PHYSICAL EXAM:  BP 110/64   Pulse 87   Temp 97.6 F (36.4 C)   Resp 18   Ht 5\' 11"  (1.803 m)   Wt 252 lb 11.2 oz (114.6 kg)   BMI 35.24 kg/m  Well-developed well-nourished patient in NAD. HEENT reveals PERLA, EOMI, discs not visualized.  Oral cavity is clear. No oral mucosal lesions are identified. Neck is clear without evidence of cervical or supraclavicular adenopathy. Lungs are clear to A&P. Cardiac examination is essentially unremarkable with regular rate and rhythm without murmur rub or thrill. Abdomen is benign with no organomegaly or masses noted. Motor sensory and DTR levels are equal and symmetric in the upper and lower extremities. Cranial nerves II through XII are grossly intact. Proprioception is intact. No peripheral adenopathy or edema is identified. No motor or sensory levels are noted. Crude visual fields are within normal  range.  RADIOLOGY RESULTS: No current films for review LABS PSA: 0.06 (12/24/2022) PSA: 0.17 (06/26/2023  PLAN: Prostate Adenocarcinoma (Gleason 7, Stage 2B) Completed IMRT radiation therapy 10 months ago. PSA stable at 0.17 (from 0.06 six months ago). No urinary or bowel symptoms. -Continue monitoring with PSA in 6 months.  Fatigue Likely secondary to Eligard (leuprolide) injection, which suppresses testosterone. -Encourage increased physical activity to improve energy levels.  General Health Maintenance Currently taking over-the-counter calcium and vitamin D. -Continue calcium and vitamin D. -Add Centrum Silver multivitamin daily.               Carmina Miller, MD

## 2023-06-30 ENCOUNTER — Other Ambulatory Visit: Payer: Self-pay | Admitting: Family

## 2023-07-21 ENCOUNTER — Other Ambulatory Visit: Payer: Self-pay | Admitting: Family

## 2023-07-21 DIAGNOSIS — I10 Essential (primary) hypertension: Secondary | ICD-10-CM

## 2023-08-14 ENCOUNTER — Encounter: Payer: Self-pay | Admitting: Podiatry

## 2023-08-14 ENCOUNTER — Ambulatory Visit (INDEPENDENT_AMBULATORY_CARE_PROVIDER_SITE_OTHER): Payer: Medicare HMO | Admitting: Podiatry

## 2023-08-14 VITALS — Ht 71.0 in | Wt 252.7 lb

## 2023-08-14 DIAGNOSIS — M79674 Pain in right toe(s): Secondary | ICD-10-CM | POA: Diagnosis not present

## 2023-08-14 DIAGNOSIS — B351 Tinea unguium: Secondary | ICD-10-CM | POA: Diagnosis not present

## 2023-08-14 DIAGNOSIS — M79675 Pain in left toe(s): Secondary | ICD-10-CM

## 2023-08-14 DIAGNOSIS — E119 Type 2 diabetes mellitus without complications: Secondary | ICD-10-CM

## 2023-08-18 ENCOUNTER — Ambulatory Visit (INDEPENDENT_AMBULATORY_CARE_PROVIDER_SITE_OTHER): Payer: Medicare HMO | Admitting: Family

## 2023-08-18 ENCOUNTER — Encounter: Payer: Self-pay | Admitting: Family

## 2023-08-18 VITALS — BP 114/86 | HR 94 | Temp 98.4°F | Ht 71.0 in | Wt 241.0 lb

## 2023-08-18 DIAGNOSIS — K635 Polyp of colon: Secondary | ICD-10-CM | POA: Diagnosis not present

## 2023-08-18 DIAGNOSIS — Z0000 Encounter for general adult medical examination without abnormal findings: Secondary | ICD-10-CM

## 2023-08-18 DIAGNOSIS — E785 Hyperlipidemia, unspecified: Secondary | ICD-10-CM | POA: Diagnosis not present

## 2023-08-18 DIAGNOSIS — R809 Proteinuria, unspecified: Secondary | ICD-10-CM | POA: Diagnosis not present

## 2023-08-18 DIAGNOSIS — R7989 Other specified abnormal findings of blood chemistry: Secondary | ICD-10-CM

## 2023-08-18 DIAGNOSIS — D509 Iron deficiency anemia, unspecified: Secondary | ICD-10-CM

## 2023-08-18 DIAGNOSIS — E1122 Type 2 diabetes mellitus with diabetic chronic kidney disease: Secondary | ICD-10-CM | POA: Diagnosis not present

## 2023-08-18 DIAGNOSIS — C61 Malignant neoplasm of prostate: Secondary | ICD-10-CM | POA: Diagnosis not present

## 2023-08-18 DIAGNOSIS — N182 Chronic kidney disease, stage 2 (mild): Secondary | ICD-10-CM | POA: Diagnosis not present

## 2023-08-18 DIAGNOSIS — E1169 Type 2 diabetes mellitus with other specified complication: Secondary | ICD-10-CM | POA: Diagnosis not present

## 2023-08-18 DIAGNOSIS — E113212 Type 2 diabetes mellitus with mild nonproliferative diabetic retinopathy with macular edema, left eye: Secondary | ICD-10-CM

## 2023-08-18 LAB — HEMOGLOBIN A1C: Hgb A1c MFr Bld: 6.4 % (ref 4.6–6.5)

## 2023-08-18 LAB — LIPID PANEL
Cholesterol: 128 mg/dL (ref 0–200)
HDL: 24.4 mg/dL — ABNORMAL LOW (ref >39.00)
LDL Cholesterol: 70 mg/dL (ref 0–99)
NonHDL: 103.9
Total CHOL/HDL Ratio: 5
Triglycerides: 172 mg/dL — ABNORMAL HIGH (ref 0.0–149.0)
VLDL: 34.4 mg/dL (ref 0.0–40.0)

## 2023-08-18 LAB — COMPREHENSIVE METABOLIC PANEL
ALT: 42 U/L (ref 0–53)
AST: 49 U/L — ABNORMAL HIGH (ref 0–37)
Albumin: 4 g/dL (ref 3.5–5.2)
Alkaline Phosphatase: 61 U/L (ref 39–117)
BUN: 15 mg/dL (ref 6–23)
CO2: 25 meq/L (ref 19–32)
Calcium: 8.9 mg/dL (ref 8.4–10.5)
Chloride: 102 meq/L (ref 96–112)
Creatinine, Ser: 1.11 mg/dL (ref 0.40–1.50)
GFR: 67.67 mL/min (ref >60.00)
Glucose, Bld: 155 mg/dL — ABNORMAL HIGH (ref 70–99)
Potassium: 3.7 meq/L (ref 3.5–5.1)
Sodium: 140 meq/L (ref 135–145)
Total Bilirubin: 0.5 mg/dL (ref 0.2–1.2)
Total Protein: 7 g/dL (ref 6.0–8.3)

## 2023-08-18 NOTE — Assessment & Plan Note (Signed)
Continue f/u with oncology as scheduled ?

## 2023-08-18 NOTE — Progress Notes (Signed)
 Subjective:  Patient ID: Jeremy Andrews, male    DOB: October 28, 1955  Age: 68 y.o. MRN: [id number]  Patient Care Team: Corwin Antu, FNP as PCP - General (Family Medicine) Cloria Banks, MD as Referring Physician (Gastroenterology) Green, Davina E, RN as Case Manager Fate Morna SAILOR, [facility] (Inactive) as Pharmacist (Pharmacist)   CC:  Chief Complaint  Patient presents with   Annual Exam    HPI Jeremy Andrews is a 68 y.o. male who presents today for an annual physical exam. He reports consuming a general diet. The patient does have a regular exercise routine.  He generally feels well. He reports sleeping fairly well. He does not have additional problems to discuss today.   Vision:Within last year Dental:Receives regular dental care  Lung Cancer Screening with low-dose Chest CT: not a candidate. Smoked 15 years and quit 68 y/o   Colonoscopy:05/22/23 repeat every three years. Multiple polyps  Pt is without acute concerns.   Advanced Directives Patient does not have advanced directives   DEPRESSION SCREENING    08/18/2023    9:00 AM 02/13/2023    9:37 AM 01/22/2022    3:12 PM 11/01/2021   10:34 AM 02/16/2021    9:22 AM 10/31/2020   10:35 AM 02/10/2019    9:45 AM  PHQ 2/9 Scores  PHQ - 2 Score 0 0 0 0 0 0 1  PHQ- 9 Score 4 4    0 4     ROS: Negative unless specifically indicated above in HPI.    Current Outpatient Medications:    Alcohol Swabs (DROPSAFE ALCOHOL PREP) 70 % PADS, USE UP TO FOUR TIMES DAILY AS DIRECTED, Disp: 400 each, Rfl: 3   allopurinol  (ZYLOPRIM ) 100 MG tablet, Take 1 tablet (100 mg total) by mouth daily., Disp: 90 tablet, Rfl: 3   amLODipine  (NORVASC ) 10 MG tablet, TAKE 1 TABLET EVERY DAY, Disp: 90 tablet, Rfl: 0   celecoxib  (CELEBREX ) 200 MG capsule, TAKE 1 CAPSULE DAILY AS NEEDED FOR MILD PAIN OR MODERATE PAIN., Disp: 90 capsule, Rfl: 3   cetirizine  (ZYRTEC ) 10 MG tablet, Take 1 tablet (10 mg total) by mouth daily., Disp: 90 tablet, Rfl: 3   empagliflozin   (JARDIANCE ) 25 MG TABS tablet, Take 1 tablet (25 mg total) by mouth daily before breakfast., Disp: 90 tablet, Rfl: 3   Ferrous Sulfate (IRON ) 325 (65 Fe) MG TABS, Take 1 tablet (325 mg total) by mouth daily., Disp: 30 tablet, Rfl: 3   labetalol  (NORMODYNE ) 100 MG tablet, TAKE 1/2 TABLET TWICE DAILY, Disp: 90 tablet, Rfl: 3   metFORMIN  (GLUCOPHAGE ) 500 MG tablet, TAKE 2 TABLETS (1,000 MG TOTAL) BY MOUTH 2 (TWO) TIMES DAILY WITH A MEAL., Disp: 360 tablet, Rfl: 3   omeprazole  (PRILOSEC) 20 MG capsule, TAKE 1 CAPSULE EVERY DAY, Disp: 90 capsule, Rfl: 3   polyethylene glycol (GOLYTELY) 236 g solution, Take by mouth., Disp: , Rfl:    rosuvastatin  (CRESTOR ) 10 MG tablet, Take one tablet twice weekly, Disp: , Rfl:    TRUE METRIX BLOOD GLUCOSE TEST test strip, USE  UP  TO FOUR TIMES DAILY AS DIRECTED, Disp: 100 strip, Rfl: 1   TRUEplus Lancets 33G MISC, USE  UP  TO FOUR TIMES DAILY AS DIRECTED, Disp: 100 each, Rfl: 1    Objective:    BP 114/86 (BP Location: Left Arm, Patient Position: Sitting, Cuff Size: Large)   Pulse 94   Temp 98.4 F (36.9 C) (Temporal)   Ht 5' 11 (1.803 m)   Wt 241 lb (  109.3 kg)   SpO2 94%   BMI 33.61 kg/m   BP Readings from Last 3 Encounters:  08/18/23 114/86  06/26/23 110/64  02/13/23 134/78      Physical Exam Vitals reviewed.  Constitutional:      General: He is not in acute distress.    Appearance: Normal appearance. He is obese. He is not ill-appearing, toxic-appearing or diaphoretic.  HENT:     Head: Normocephalic.     Right Ear: Tympanic membrane normal.     Left Ear: Tympanic membrane normal.     Nose: Nose normal.     Mouth/Throat:     Mouth: Mucous membranes are moist.     Dentition: Abnormal dentition.  Eyes:     Pupils: Pupils are equal, round, and reactive to light.  Cardiovascular:     Rate and Rhythm: Normal rate and regular rhythm.  Pulmonary:     Effort: Pulmonary effort is normal.     Breath sounds: Normal breath sounds.   Musculoskeletal:        General: Normal range of motion.  Skin:    General: Skin is warm.  Neurological:     General: No focal deficit present.     Mental Status: He is alert and oriented to person, place, and time. Mental status is at baseline.  Psychiatric:        Mood and Affect: Mood normal.        Behavior: Behavior normal.        Thought Content: Thought content normal.        Judgment: Judgment normal.          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 Work on diabetic diet and exercise as tolerated. Yearly foot exam, and annual eye exam.  Urine microalbumin ordered for today as well.    Prostate cancer [facility]) Assessment & Plan: Continue f/u with oncology as scheduled.    Positive for microalbuminuria Assessment & Plan: Repeating today  High anaphylaxin to lisinopril   Hesitant for ARB   Elevated LFTs Assessment & Plan: Repeating today    Encounter for general adult medical examination without abnormal findings Assessment & Plan: Patient Counseling(The following topics were reviewed):  Preventative care handout given to pt  Health maintenance and immunizations reviewed. Please refer to Health maintenance section. Pt advised on safe sex, wearing seatbelts in car, and proper nutrition labwork  ordered today for annual Dental health: Discussed importance of regular tooth brushing, flossing, and dental visits.         Follow-up: Return in about 6 months (around 02/15/2024) for f/u diabetes.   Jeremy Patrick, FNP

## 2023-08-18 NOTE — Addendum Note (Signed)
 Addended by: Alvina Chou on: 08/18/2023 09:51 AM   Modules accepted: Orders

## 2023-08-18 NOTE — Assessment & Plan Note (Signed)
 Foot exam already completed by podiatry Ordered hga1c today pending results. Work on diabetic diet and exercise as tolerated. Yearly foot exam, and annual eye exam.  Urine microalbumin ordered for today as well.

## 2023-08-18 NOTE — Assessment & Plan Note (Signed)
 Repeating today  High anaphylaxin to lisinopril  Hesitant for ARB

## 2023-08-18 NOTE — Assessment & Plan Note (Signed)

## 2023-08-18 NOTE — Assessment & Plan Note (Signed)
Repeating today

## 2023-08-18 NOTE — Assessment & Plan Note (Signed)
 Cmp ordered for today

## 2023-08-19 LAB — MICROALBUMIN / CREATININE URINE RATIO
Creatinine,U: 181.9 mg/dL
Microalb Creat Ratio: 48.2 mg/g — ABNORMAL HIGH (ref 0.0–30.0)
Microalb, Ur: 87.7 mg/dL — ABNORMAL HIGH (ref 0.0–1.9)

## 2023-08-20 ENCOUNTER — Other Ambulatory Visit: Payer: Self-pay | Admitting: Family

## 2023-08-20 DIAGNOSIS — E1122 Type 2 diabetes mellitus with diabetic chronic kidney disease: Secondary | ICD-10-CM

## 2023-08-20 DIAGNOSIS — R809 Proteinuria, unspecified: Secondary | ICD-10-CM

## 2023-08-20 NOTE — Progress Notes (Signed)
 Urine microalbumin remains elevated. Unable to put on ACE or ARB due to history of anaphylaxis to lisinopril . Has pt ever seen a kidney doc? If no, I am going to send him in a referral because microalbumin is persistent.

## 2023-08-21 NOTE — Progress Notes (Signed)
Referral has been placed. 

## 2023-08-22 NOTE — Progress Notes (Signed)
ANNUAL DIABETIC FOOT EXAM  Subjective: Jeremy Andrews presents today for annual diabetic foot exam.  Chief Complaint  Patient presents with   Nail Problem    Pt is here for [facility] pt is unsure of last A1C PCP is Dr Alfonse Alpers and LOV was 6 months ago.   Patient confirms h/o diabetes.  Patient denies any h/o foot wounds.  Mort Sawyers, FNP is patient's PCP.  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 type 2 diabetes mellitus (HCC) 12/12/2022   Moderate nonproliferative diabetic retinopathy of right eye with macular edema associated with type 2 diabetes mellitus (HCC) 12/12/2022   Positive for microalbuminuria 08/06/2022   Prostate cancer (HCC) 08/06/2022   Elevated LFTs 08/06/2022   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) 01/11/2022   CKD stage 2 due to type 2 diabetes mellitus (HCC) 01/11/2022   Type 2 diabetes mellitus with other specified complication (HCC) 10/15/2021   Myalgia due to statin 05/03/2020   Microcytic anemia 11/01/2019   Dyspnea on exertion 08/31/2019   Essential hypertension 02/09/2019   Hyperlipidemia 02/09/2019   GERD (gastroesophageal reflux disease) 02/09/2019   Gout 02/09/2019   Environmental allergies 02/09/2019   Past Surgical History:  Procedure Laterality Date   COLONOSCOPY W/ POLYPECTOMY N/A    Current Outpatient Medications on File Prior to Visit  Medication Sig Dispense Refill   Alcohol Swabs (DROPSAFE ALCOHOL PREP) 70 % PADS USE UP TO FOUR TIMES DAILY AS DIRECTED 400 each 3   allopurinol (ZYLOPRIM) 100 MG tablet Take 1 tablet (100 mg total) by mouth daily. 90 tablet 3   amLODipine (NORVASC) 10 MG tablet TAKE 1 TABLET EVERY DAY 90 tablet 0    celecoxib (CELEBREX) 200 MG capsule TAKE 1 CAPSULE DAILY AS NEEDED FOR MILD PAIN OR MODERATE PAIN. 90 capsule 3   cetirizine (ZYRTEC) 10 MG tablet Take 1 tablet (10 mg total) by mouth daily. 90 tablet 3   empagliflozin (JARDIANCE) 25 MG TABS tablet Take 1 tablet (25 mg total) by mouth daily before breakfast. 90 tablet 3   Ferrous Sulfate (IRON) 325 (65 Fe) MG TABS Take 1 tablet (325 mg total) by mouth daily. 30 tablet 3   labetalol (NORMODYNE) 100 MG tablet TAKE 1/2 TABLET TWICE DAILY 90 tablet 3   metFORMIN (GLUCOPHAGE) 500 MG tablet TAKE 2 TABLETS (1,000 MG TOTAL) BY MOUTH 2 (TWO) TIMES DAILY WITH A MEAL. 360 tablet 3   omeprazole (PRILOSEC) 20 MG capsule TAKE 1 CAPSULE EVERY DAY 90 capsule 3   polyethylene glycol (GOLYTELY) 236 g solution Take by mouth.     rosuvastatin (CRESTOR) 10 MG tablet Take one tablet twice weekly     TRUE METRIX BLOOD GLUCOSE TEST test strip USE  UP  TO FOUR TIMES DAILY AS DIRECTED 100 strip 1   TRUEplus Lancets 33G MISC USE  UP  TO FOUR TIMES DAILY AS DIRECTED 100 each 1   No current facility-administered medications on file prior to visit.    Allergies  Allergen Reactions   Lisinopril Swelling   Statins     Severe myopathy, in wheel chair Severe myopathy, in wheel chair   Social History  Occupational History   Not on file  Tobacco Use   Smoking status: Former    Current packs/day: 0.00    Average packs/day: 0.5 packs/day for 25.0 years (12.5 ttl pk-yrs)    Types: Cigarettes    Start date: 87    Quit date: 62    Years since quitting: 35.0   Smokeless tobacco: Never  Vaping Use   Vaping status: Never Used  Substance and Sexual Activity   Alcohol use: Yes    Alcohol/week: 2.0 - 3.0 standard drinks of alcohol    Types: 2 - 3 Cans of beer per week    Comment: 2-3 beers weekly   Drug use: Not Currently    Types: Marijuana    Comment: last time around 2000 or even before then   Sexual activity: Yes    Birth control/protection: None,  Post-menopausal   Family History  Problem Relation Age of Onset   Diabetes Mother    Hypertension Mother    Gout Mother    Other Mother        meningioma   Hypertension Father    Kidney disease Sister    Hypertension Sister    Kidney failure Sister    Other Sister        legally blind   Diabetes Sister    Breast cancer Sister    Diabetes Brother    Arthritis Brother    Gout Brother    Breast cancer Maternal Grandmother 60   Immunization History  Administered Date(s) Administered   PFIZER Comirnaty(Gray Top)Covid-19 Tri-Sucrose Vaccine 09/26/2020   PFIZER(Purple Top)SARS-COV-2 Vaccination 09/28/2019, 10/19/2019   Pfizer(Comirnaty)Fall Seasonal Vaccine 12 years and older 05/28/2023   Pneumococcal Conjugate-13 03/07/2017   Pneumococcal Polysaccharide-23 08/31/2019   Tdap 03/07/2017     Review of Systems: Negative except as noted in the HPI.   Objective: There were no vitals filed for this visit.  Jeremy Andrews is a pleasant 68 y.o. male in NAD. AAO X 3.  Title   Diabetic Foot Exam - detailed Date & Time: 08/14/2023  9:45 AM Diabetic Foot exam was performed with the following findings: Yes  Visual Foot Exam completed.: Yes  Is there a history of foot ulcer?: No Is there a foot ulcer now?: No Is there swelling?: No Is there elevated skin temperature?: No Is there abnormal foot shape?: No Is there a claw toe deformity?: No Are the toenails long?: Yes Are the toenails thick?: Yes Are the toenails ingrown?: No Is the skin thin, fragile, shiny and hairless?": No Normal Range of Motion?: Yes Is there foot or ankle muscle weakness?: No Do you have pain in calf while walking?: No Are the shoes appropriate in style and fit?: Yes Can the patient see the bottom of their feet?: Yes Pulse Foot Exam completed.: Yes   Right Posterior Tibialis: Present Left posterior Tibialis: Present   Right Dorsalis Pedis: Present Left Dorsalis Pedis: Present     Sensory Foot Exam  Completed.: Yes Semmes-Weinstein Monofilament Test "+" means "has sensation" and "-" means "no sensation"  R Foot Test Control: Pos L Foot Test Control: Pos   R Site 1-Great Toe: Pos L Site 1-Great Toe: Pos   R Site 4: Pos L Site 4: Pos   R site 5: Pos L Site 5: Pos  R Site 6: Pos L Site 6: Pos     Image components are not supported.   Image components are not supported. Image components are not supported.  Tuning [place] Right vibratory:  present Left vibratory: present  Comments     Lab Results  Component Value Date   HGBA1C 6.4 08/18/2023   ADA Risk Categorization: Low Risk :  Patient has all of the following: Intact protective sensation No prior foot ulcer  No severe deformity Pedal pulses present  Assessment: 1. Pain due to onychomycosis of toenails of both feet   2. Diabetes mellitus without complication (HCC)   3. Encounter for diabetic foot exam [facility])      Plan: -Patient was evaluated today. All questions/concerns addressed on today's visit. -Diabetic foot examination performed today. -Continue diabetic foot care principles: inspect feet daily, monitor glucose as recommended by PCP and/or Endocrinologist, and follow prescribed diet per PCP, Endocrinologist and/or dietician. -Patient to continue soft, supportive shoe gear daily. -Toenails 1-5 b/l were debrided in length and girth with sterile nail nippers and dremel without iatrogenic bleeding.  -Patient/POA to call should there be question/concern in the interim. Return in about 3 months (around 11/12/2023).  Freddie Breech, DPM      [place] LOCATION: [address], [place] [address]                   Office [phone]   [facility] LOCATION: [address] [place], [place] [address] Office [phone]

## 2023-09-10 ENCOUNTER — Other Ambulatory Visit: Payer: Self-pay | Admitting: Family

## 2023-09-10 DIAGNOSIS — E785 Hyperlipidemia, unspecified: Secondary | ICD-10-CM

## 2023-10-06 ENCOUNTER — Other Ambulatory Visit: Payer: Self-pay | Admitting: Family

## 2023-10-06 DIAGNOSIS — I10 Essential (primary) hypertension: Secondary | ICD-10-CM

## 2023-10-07 ENCOUNTER — Telehealth: Payer: Self-pay | Admitting: Family

## 2023-10-07 NOTE — Telephone Encounter (Signed)
 This time frame seems a bit unreasonable, referral was placed 08/20/23. Is there another office we can refer to?   And in the future I will likely recommend that my patients are not referred here (just to confirm this is [facility] or [facility]?). I appreciate it Ashtyn.

## 2023-10-07 NOTE — Telephone Encounter (Signed)
 Referral Notes Number of Notes: 2 . Type Date User Summary Attachment  General 09/17/2023 10:06 AM Maisie Fus, CMA Called Nephrology to follow up on this referral -  Note: Called Nephrology to follow up on this referral, they have the patient in their system but they have not called to schedule yet. They stated that referral coordinator will call to schedule.   Called and gave the patient contact information so that he can call if he doesn't hear from them within 1 week.  ------------------------------------------------- Spoke with pt and he states that he has not heard from [facility] and he has tried to call them but states that his phone won't connect call then it disconnects the call.  Can you please follow up on this?

## 2023-10-07 NOTE — Telephone Encounter (Signed)
 I called Nephrology to follow up on this referral. The office stated that on 09/19/23 his referral was reviewed and rated a level 4, non-urgent. She stated that when they get to his scheduling group they will call him to schedule. I asked her what that time frame looked like so that I could make the PCP aware, she stated he should get a call within a few weeks.

## 2023-10-07 NOTE — Telephone Encounter (Signed)
 Copied from CRM [id number]. Topic: General - Other >> Oct 07, 2023  9:52 AM Rodman Pickle T wrote: Reason for CRM: patient called back in regarding [facility] in [place] has not contacted him regarding a appt and it has been month and he still has not heard anything he would like a call back regarding this

## 2023-10-27 ENCOUNTER — Other Ambulatory Visit: Payer: Self-pay | Admitting: Family

## 2023-11-13 ENCOUNTER — Encounter: Payer: Self-pay | Admitting: Podiatry

## 2023-11-13 ENCOUNTER — Ambulatory Visit: Payer: Medicare HMO | Admitting: Podiatry

## 2023-11-13 DIAGNOSIS — B351 Tinea unguium: Secondary | ICD-10-CM | POA: Diagnosis not present

## 2023-11-13 DIAGNOSIS — M79674 Pain in right toe(s): Secondary | ICD-10-CM

## 2023-11-13 DIAGNOSIS — M79675 Pain in left toe(s): Secondary | ICD-10-CM

## 2023-11-13 DIAGNOSIS — E119 Type 2 diabetes mellitus without complications: Secondary | ICD-10-CM | POA: Diagnosis not present

## 2023-11-13 NOTE — Progress Notes (Signed)
  Subjective:  Patient ID: Jeremy Andrews, male    DOB: 1956/01/26,  MRN: [id number]  68 y.o. male presents preventative diabetic foot care and painful, elongated thickened toenails x 10 which are symptomatic when wearing enclosed shoe gear. This interferes with his/her daily activities. Chief Complaint  Patient presents with   Diabetes    "Just a little trim, in the winter time, my toenails don't grow."  Jeremy Sawyers, Jeremy Andrews - 08/18/2023; A1c - 6.4   New problem(s): None   PCP is Jeremy Sawyers, Jeremy Andrews.  Allergies  Allergen Reactions   Lisinopril Swelling   Statins     Severe myopathy, in wheel chair Severe myopathy, in wheel chair    Review of Systems: Negative except as noted in the HPI.   Objective:  Jeremy Andrews is a pleasant 68 y.o. male in NAD. AAO x 3.  Vascular Examination: Vascular status intact b/l with palpable pedal pulses. CFT immediate b/l. Pedal hair present. No edema. No pain with calf compression b/l. Skin temperature gradient WNL b/l. No varicosities noted. No cyanosis or clubbing noted.  Neurological Examination: Sensation grossly intact b/l with 10 gram monofilament. Vibratory sensation intact b/l.  Dermatological Examination: Pedal skin with normal turgor, texture and tone b/l. No open wounds nor interdigital macerations noted. Toenails 1-5 b/l thick, discolored, elongated with subungual debris and pain on dorsal palpation. No hyperkeratotic lesions noted b/l.   Musculoskeletal Examination: Muscle strength 5/5 to b/l LE.  Pes planus b/l. Patient ambulates independently without assistive aids.   Radiographs: None  Last A1c:      Latest Ref Rng & Units 08/18/2023    9:18 AM 02/13/2023    8:48 AM  Hemoglobin A1C  Hemoglobin-A1c 4.6 - 6.5 % 6.4  6.7      Assessment:   1. Pain due to onychomycosis of toenails of both feet   2. Diabetes mellitus without complication (HCC)    Plan:  Consent given for treatment. Patient examined. All patient's and/or POA's  questions/concerns addressed on today's visit. Toenails 1-5 debrided in length and girth without incident. Continue foot and shoe inspections daily. Monitor blood glucose per PCP/Endocrinologist's recommendations. Continue soft, supportive shoe gear daily. Report any pedal injuries to medical professional. Call office if there are any questions/concerns. -Patient/POA to call should there be question/concern in the interim.  Return in about 3 months (around 02/12/2024).  Jeremy Andrews, Jeremy Andrews      [place] LOCATION: [address] [place] [address]                   Office [phone]   [facility] LOCATION: [address] [place], [place] [address] Office [phone]

## 2023-11-17 DIAGNOSIS — R809 Proteinuria, unspecified: Secondary | ICD-10-CM | POA: Diagnosis not present

## 2023-11-17 DIAGNOSIS — E1122 Type 2 diabetes mellitus with diabetic chronic kidney disease: Secondary | ICD-10-CM | POA: Diagnosis not present

## 2023-11-17 DIAGNOSIS — N182 Chronic kidney disease, stage 2 (mild): Secondary | ICD-10-CM | POA: Diagnosis not present

## 2023-11-17 DIAGNOSIS — I129 Hypertensive chronic kidney disease with stage 1 through stage 4 chronic kidney disease, or unspecified chronic kidney disease: Secondary | ICD-10-CM | POA: Diagnosis not present

## 2023-11-19 LAB — LAB REPORT - SCANNED
Albumin, Urine POC: 322.6
Creatinine, POC: 96.6 mg/dL
EGFR: 94
Microalb Creat Ratio: 334

## 2023-11-19 NOTE — Progress Notes (Signed)
 noted

## 2023-12-31 ENCOUNTER — Inpatient Hospital Stay: Payer: Medicare HMO | Attending: Radiation Oncology

## 2023-12-31 DIAGNOSIS — C61 Malignant neoplasm of prostate: Secondary | ICD-10-CM | POA: Insufficient documentation

## 2023-12-31 LAB — PSA: Prostatic Specific Antigen: 0.17 ng/mL (ref 0.00–4.00)

## 2024-01-07 ENCOUNTER — Other Ambulatory Visit: Payer: Self-pay | Admitting: Family

## 2024-01-07 DIAGNOSIS — E1169 Type 2 diabetes mellitus with other specified complication: Secondary | ICD-10-CM

## 2024-01-08 ENCOUNTER — Ambulatory Visit
Admission: RE | Admit: 2024-01-08 | Discharge: 2024-01-08 | Disposition: A | Discharge status: Home / Self Care | Payer: Medicare HMO | Source: Ambulatory Visit | Attending: Radiation Oncology | Admitting: Radiation Oncology

## 2024-01-08 VITALS — BP 136/81 | HR 96 | Temp 98.0°F | Resp 16 | Ht 71.0 in | Wt 248.1 lb

## 2024-01-08 DIAGNOSIS — Z923 Personal history of irradiation: Secondary | ICD-10-CM | POA: Insufficient documentation

## 2024-01-08 DIAGNOSIS — C61 Malignant neoplasm of prostate: Secondary | ICD-10-CM | POA: Diagnosis present

## 2024-01-08 DIAGNOSIS — Z191 Hormone sensitive malignancy status: Secondary | ICD-10-CM | POA: Diagnosis not present

## 2024-01-08 NOTE — Progress Notes (Signed)
 Radiation Oncology Follow up Note  Name: Jeremy Andrews   Date:   01/08/2024 MRN:  [id number] DOB: 12/28/1955    This 68 y.o. male presents to the clinic today for 74-month follow-up status post image guided IMRT radiation therapy for stage IIb Gleason 7 (3+4) adenocarcinoma.  REFERRING PROVIDER: Felicita Horns, FNP  HPI: Patient is a 68 year old male now out 16 months having completed image guided IMRT radiation therapy for Gleason 7 (3+4) adenocarcinoma the prostate seen today in routine follow-up he is doing well he has nocturia x 2-3 fairly average.  No problems with diarrhea or fatigue.  His most recent PSA is perfectly stable from last November at 0.17.  COMPLICATIONS OF TREATMENT: none  FOLLOW UP COMPLIANCE: keeps appointments   PHYSICAL EXAM:  BP 136/81   Pulse 96   Temp 98 F (36.7 C) (Tympanic)   Resp 16   Ht 5\' 11"  (1.803 m)   Wt 248 lb 1.6 oz (112.5 kg)   BMI 34.60 kg/m  Well-developed well-nourished patient in NAD. HEENT reveals PERLA, EOMI, discs not visualized.  Oral cavity is clear. No oral mucosal lesions are identified. Neck is clear without evidence of cervical or supraclavicular adenopathy. Lungs are clear to A&P. Cardiac examination is essentially unremarkable with regular rate and rhythm without murmur rub or thrill. Abdomen is benign with no organomegaly or masses noted. Motor sensory and DTR levels are equal and symmetric in the upper and lower extremities. Cranial nerves II through XII are grossly intact. Proprioception is intact. No peripheral adenopathy or edema is identified. No motor or sensory levels are noted. Crude visual fields are within normal range.  RADIOLOGY RESULTS: No current films for review  PLAN: At the present time patient is under excellent biochemical control of his prostate cancer.  And pleased with his overall progress and low side effect profile.  Of asked to see him back in 6 months with a follow-up PSA.  Patient knows to call with any  concerns.  I would like to take this opportunity to thank you for allowing me to participate in the care of your patient.Glenis Langdon, MD

## 2024-01-26 ENCOUNTER — Ambulatory Visit (INDEPENDENT_AMBULATORY_CARE_PROVIDER_SITE_OTHER): Payer: Medicare HMO

## 2024-01-26 VITALS — BP 136/81 | Ht 71.0 in | Wt 248.0 lb

## 2024-01-26 DIAGNOSIS — Z0000 Encounter for general adult medical examination without abnormal findings: Secondary | ICD-10-CM

## 2024-01-26 NOTE — Patient Instructions (Signed)
 Mr. Willner , Thank you for taking time out of your busy schedule to complete your Annual Wellness Visit with me. I enjoyed our conversation and look forward to speaking with you again next year. I, as well as your care team,  appreciate your ongoing commitment to your health goals. Please review the following plan we discussed and let me know if I can assist you in the future. Your Game plan/ To Do List    Referrals: If you haven't heard from the office you've been referred to, please reach out to them at the phone provided.  none Follow up Visits: Next Medicare AWV with our clinical staff: 01/27/2025   Have you seen your provider in the last 6 months (3 months if uncontrolled diabetes)? No Next Office Visit with your provider:   Clinician Recommendations:  Aim for 30 minutes of exercise or brisk walking, 6-8 glasses of water, and 5 servings of fruits and vegetables each day.       This is a list of the screening recommended for you and due dates:  Health Maintenance  Topic Date Due   Zoster (Shingles) Vaccine (1 of 2) Never done   COVID-19 Vaccine (5 - Pfizer risk 2024-25 season) 11/26/2023   Eye exam for diabetics  11/28/2023   Medicare Annual Wellness Visit  01/23/2024   Hemoglobin A1C  02/15/2024   Flu Shot  03/05/2024   Complete foot exam   08/13/2024   Yearly kidney function blood test for diabetes  11/18/2024   Yearly kidney health urinalysis for diabetes  11/18/2024   Colon Cancer Screening  05/21/2026   DTaP/Tdap/Td vaccine (2 - Td or Tdap) 03/08/2027   Pneumococcal Vaccine for age over 95  Completed   Hepatitis C Screening  Completed   HPV Vaccine  Aged Out   Meningitis B Vaccine  Aged Out    Advanced directives: (Declined) Advance directive discussed with you today. Even though you declined this today, please call our office should you change your mind, and we can give you the proper paperwork for you to fill out. Advance Care Planning is important because it:  [x]   Makes sure you receive the medical care that is consistent with your values, goals, and preferences  [x]  It provides guidance to your family and loved ones and reduces their decisional burden about whether or not they are making the right decisions based on your wishes.  Follow the link provided in your after visit summary or read over the paperwork we have mailed to you to help you started getting your Advance Directives in place. If you need assistance in completing these, please reach out to us  so that we can help you!  See attachments for Preventive Care and Fall Prevention Tips.

## 2024-01-26 NOTE — Progress Notes (Signed)
 Because this visit was a virtual/telehealth visit,  certain criteria was not obtained, such a blood pressure, CBG if applicable, and timed get up and go. Any medications not marked as taking were not mentioned during the medication reconciliation part of the visit. Any vitals not documented were not able to be obtained due to this being a telehealth visit or patient was unable to self-report a recent blood pressure reading due to a lack of equipment at home via telehealth. Vitals that have been documented are verbally provided by the patient.   This visit was performed by a medical professional under my direct supervision. I was immediately available for consultation/collaboration. I have reviewed and agree with the Annual Wellness Visit documentation.  Subjective:   Jeremy Andrews is a 68 y.o. who presents for a Medicare Wellness preventive visit.  As a reminder, Annual Wellness Visits don't include a physical exam, and some assessments may be limited, especially if this visit is performed virtually. We may recommend an in-person follow-up visit with your provider if needed.  Visit Complete: Virtual I connected with  Jeremy Andrews on 01/26/24 by a audio enabled telemedicine application and verified that I am speaking with the correct person using two identifiers.  Patient Location: Home  Provider Location: Home Office  I discussed the limitations of evaluation and management by telemedicine. The patient expressed understanding and agreed to proceed.  Vital Signs: Because this visit was a virtual/telehealth visit, some criteria may be missing or patient reported. Any vitals not documented were not able to be obtained and vitals that have been documented are patient reported.  VideoDeclined- This patient declined Librarian, academic. Therefore the visit was completed with audio only.  Persons Participating in Visit: Patient.  AWV Questionnaire: No: Patient Medicare  AWV questionnaire was not completed prior to this visit.  Cardiac Risk Factors include: advanced age (>64men, >84 women);obesity (BMI >30kg/m2);male gender;hypertension;diabetes mellitus;dyslipidemia     Objective:    Today's Vitals   01/26/24 1139  BP: 136/81  Weight: 248 lb (112.5 kg)  Height: 5' 11 (1.803 m)  PainSc: 0-No pain   Body mass index is 34.59 kg/m.     01/26/2024   11:44 AM 01/08/2024    9:36 AM 06/26/2023   10:28 AM 01/23/2023    9:21 AM 01/01/2023   10:03 AM 10/02/2022   11:28 AM 05/21/2022   10:09 AM  Advanced Directives  Does Patient Have a Medical Advance Directive? No No No No No No No  Would patient like information on creating a medical advance directive? No - Patient declined No - Patient declined No - Patient declined No - Patient declined No - Patient declined No - Patient declined No - Patient declined    Current Medications (verified) Outpatient Encounter Medications as of 01/26/2024  Medication Sig   Alcohol Swabs (DROPSAFE ALCOHOL PREP) 70 % PADS USE UP TO FOUR TIMES DAILY AS DIRECTED   allopurinol  (ZYLOPRIM ) 100 MG tablet Take 1 tablet (100 mg total) by mouth daily.   amLODipine  (NORVASC ) 10 MG tablet TAKE 1 TABLET EVERY DAY   celecoxib  (CELEBREX ) 200 MG capsule TAKE 1 CAPSULE DAILY AS NEEDED FOR MILD PAIN OR MODERATE PAIN.   cetirizine  (ZYRTEC ) 10 MG tablet Take 1 tablet (10 mg total) by mouth daily.   Ferrous Sulfate (IRON ) 325 (65 Fe) MG TABS Take 1 tablet (325 mg total) by mouth daily.   JARDIANCE  25 MG TABS tablet TAKE 1 TABLET EVERY DAY BEFORE BREAKFAST   labetalol  (NORMODYNE )  100 MG tablet TAKE 1/2 TABLET TWICE DAILY   metFORMIN  (GLUCOPHAGE ) 500 MG tablet TAKE 2 TABLETS (1,000 MG TOTAL) BY MOUTH 2 (TWO) TIMES DAILY WITH A MEAL.   omeprazole  (PRILOSEC) 20 MG capsule TAKE 1 CAPSULE EVERY DAY   polyethylene glycol (GOLYTELY) 236 g solution Take by mouth.   rosuvastatin  (CRESTOR ) 10 MG tablet TAKE 1 TABLET (10 MG TOTAL) BY MOUTH DAILY.   TRUE  METRIX BLOOD GLUCOSE TEST test strip USE  UP  TO FOUR TIMES DAILY AS DIRECTED   TRUEplus Lancets 33G MISC USE  UP  TO FOUR TIMES DAILY AS DIRECTED   No facility-administered encounter medications on file as of 01/26/2024.    Allergies (verified) Lisinopril  and Statins   History: Past Medical History:  Diagnosis Date   Allergy    Colon polyps    Diabetes mellitus without complication (HCC)    Gout    Hyperlipidemia    Hypertension    Past Surgical History:  Procedure Laterality Date   COLONOSCOPY W/ POLYPECTOMY N/A    Family History  Problem Relation Age of Onset   Diabetes Mother    Hypertension Mother    Gout Mother    Other Mother        meningioma   Hypertension Father    Kidney disease Sister    Hypertension Sister    Kidney failure Sister    Other Sister        legally blind   Diabetes Sister    Breast cancer Sister    Diabetes Brother    Arthritis Brother    Gout Brother    Breast cancer Maternal Grandmother 66   Social History   Socioeconomic History   Marital status: Married    Spouse name: Not on file   Number of children: 1   Years of education: 2 years of college   Highest education level: Not on file  Occupational History   Not on file  Tobacco Use   Smoking status: Former    Current packs/day: 0.00    Average packs/day: 0.5 packs/day for 25.0 years (12.5 ttl pk-yrs)    Types: Cigarettes    Start date: 61    Quit date: 1990    Years since quitting: 35.4   Smokeless tobacco: Never  Vaping Use   Vaping status: Never Used  Substance and Sexual Activity   Alcohol use: Yes    Alcohol/week: 2.0 - 3.0 standard drinks of alcohol    Types: 2 - 3 Cans of beer per week    Comment: 2-3 beers weekly   Drug use: Not Currently    Types: Marijuana    Comment: last time around 2000 or even before then   Sexual activity: Yes    Birth control/protection: None, Post-menopausal  Other Topics Concern   Not on file  Social History Narrative    02/09/19   From: the area   Living: still married but living seperately   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 Works good for them.       Family: one daughter - with CP in a group home, one son who has passed, and 3 step sons, separated from wife but good relationship, father also supportive      Enjoys: not much currently, restoring cars, tablet games      Exercise: not really, mowing the yard   Diet: does not follow a diabetic diet  Safety   Seat belts: Yes    Guns: No   Safe in relationships: Yes    Social Drivers of Corporate investment banker Strain: Low Risk  (01/26/2024)   Overall Financial Resource Strain (CARDIA)    Difficulty of Paying Living Expenses: Not hard at all  Food Insecurity: No Food Insecurity (01/26/2024)   Hunger Vital Sign    Worried About Running Out of Food in the Last Year: Never true    Ran Out of Food in the Last Year: Never true  Transportation Needs: No Transportation Needs (01/26/2024)   PRAPARE - Administrator, Civil Service (Medical): No    Lack of Transportation (Non-Medical): No  Physical Activity: Inactive (01/23/2023)   Exercise Vital Sign    Days of Exercise per Week: 0 days    Minutes of Exercise per Session: 0 min  Stress: No Stress Concern Present (01/26/2024)   [employer] of Occupational Health - Occupational Stress Questionnaire    Feeling of Stress: Not at all  Social Connections: Moderately Isolated (01/23/2023)   Social Connection and Isolation Panel    Frequency of Communication with Friends and Family: More than three times a week    Frequency of Social Gatherings with Friends and Family: More than three times a week    Attends Religious Services: Never    Database administrator or Organizations: No    Attends Engineer, structural: Never    Marital Status: Married    Tobacco Counseling Counseling given: Not Answered    Clinical  Intake:  Pre-visit preparation completed: Yes  Pain : No/denies pain Pain Score: 0-No pain     BMI - recorded: 34.59 Nutritional Status: BMI > 30  Obese Nutritional Risks: None Diabetes: Yes CBG done?: No Did pt. bring in CBG monitor from home?: No  Lab Results  Component Value Date   HGBA1C 6.4 08/18/2023   HGBA1C 6.7 (A) 02/13/2023   HGBA1C 6.1 08/14/2022     How often do you need to have someone help you when you read instructions, pamphlets, or other written materials from your doctor or pharmacy?: 1 - Never What is the last grade level you completed in school?: some college  Interpreter Needed?: No  Information entered by :: [employer]   Activities of Daily Living     01/26/2024   11:43 AM  In your present state of health, do you have any difficulty performing the following activities:  Hearing? 0  Vision? 0  Difficulty concentrating or making decisions? 0  Walking or climbing stairs? 0  Dressing or bathing? 0  Doing errands, shopping? 0  Preparing Food and eating ? N  Using the Toilet? N  In the past six months, have you accidently leaked urine? N  Do you have problems with loss of bowel control? N  Managing your Medications? N  Managing your Finances? N  Housekeeping or managing your Housekeeping? N    Patient Care Team: Corwin Antu, FNP as PCP - General (Family Medicine) Cloria Banks, MD as Referring Physician (Gastroenterology) Green, Davina E, RN as Case Manager Fate Morna SAILOR, [facility] (Inactive) as Pharmacist (Pharmacist)  I have updated your Care Teams any recent Medical Services you may have received from other providers in the past year.     Assessment:   This is a routine wellness examination for [place].  Hearing/Vision screen Hearing Screening - Comments:: No difficulties hearing  Vision Screening - Comments:: Patient has no vision issues  Goals Addressed             This Visit's Progress    Patient Stated   On  track    Lose 50 pounds       Depression Screen     01/26/2024   11:44 AM 08/18/2023    9:00 AM 02/13/2023    9:37 AM 01/22/2022    3:12 PM 11/01/2021   10:34 AM 02/16/2021    9:22 AM 10/31/2020   10:35 AM  PHQ 2/9 Scores  PHQ - 2 Score 0 0 0 0 0 0 0  PHQ- 9 Score 0 4 4    0    Fall Risk     01/26/2024   11:43 AM 08/18/2023    9:00 AM 02/13/2023    9:36 AM 01/23/2023    9:11 AM 03/21/2022   12:33 PM  Fall Risk   Falls in the past year? 0 0 0 0 0  Number falls in past yr: 0 0  0   Injury with Fall? 0 0  0   Risk for fall due to : No Fall Risks No Fall Risks  No Fall Risks   Follow up Falls evaluation completed Falls evaluation completed  Falls prevention discussed;Falls evaluation completed     MEDICARE RISK AT HOME:  Medicare Risk at Home Any stairs in or around the home?: Yes If so, are there any without handrails?: No Home free of loose throw rugs in walkways, pet beds, electrical cords, etc?: Yes Adequate lighting in your home to reduce risk of falls?: Yes Life alert?: No Use of a cane, walker or w/c?: No Grab bars in the bathroom?: Yes Shower chair or bench in shower?: Yes Elevated toilet seat or a handicapped toilet?: Yes  TIMED UP AND GO:  Was the test performed?  No  Cognitive Function: 6CIT completed    10/31/2020   10:38 AM  MMSE - Mini Mental State Exam  Orientation to time 5  Orientation to Place 5  Registration 3  Attention/ Calculation 5  Recall 3  Language- repeat 1        01/26/2024   11:41 AM 01/23/2023    9:24 AM 11/01/2021   12:00 PM  6CIT Screen  What Year? 0 points 0 points 0 points  What month? 0 points 0 points 0 points  What time? 0 points 0 points 0 points  Count back from 20 0 points 0 points 0 points  Months in reverse 0 points 0 points 0 points  Repeat phrase 0 points 2 points 0 points  Total Score 0 points 2 points 0 points    Immunizations Immunization History  Administered Date(s) Administered   PFIZER Comirnaty(Gray  Top)Covid-19 Tri-Sucrose Vaccine 09/26/2020   PFIZER(Purple Top)SARS-COV-2 Vaccination 09/28/2019, 10/19/2019   Pfizer(Comirnaty)Fall Seasonal Vaccine 12 years and older 05/28/2023   Pneumococcal Conjugate-13 03/07/2017   Pneumococcal Polysaccharide-23 08/31/2019   Tdap 03/07/2017    Screening Tests Health Maintenance  Topic Date Due   Zoster Vaccines- Shingrix (1 of 2) Never done   COVID-19 Vaccine (5 - Pfizer risk 2024-25 season) 11/26/2023   OPHTHALMOLOGY EXAM  11/28/2023   HEMOGLOBIN A1C  02/15/2024   INFLUENZA VACCINE  03/05/2024   FOOT EXAM  08/13/2024   Diabetic kidney evaluation - eGFR measurement  11/18/2024   Diabetic kidney evaluation - Urine ACR  11/18/2024   Medicare Annual Wellness (AWV)  01/25/2025   Colonoscopy  05/21/2026   DTaP/Tdap/Td (2 - Td or Tdap) 03/08/2027  Pneumococcal Vaccine: 50+ Years  Completed   Hepatitis C Screening  Completed   HPV VACCINES  Aged Out   Meningococcal B Vaccine  Aged Out    Health Maintenance  Health Maintenance Due  Topic Date Due   Zoster Vaccines- Shingrix (1 of 2) Never done   COVID-19 Vaccine (5 - Pfizer risk 2024-25 season) 11/26/2023   OPHTHALMOLOGY EXAM  11/28/2023   Health Maintenance Items Addressed:   Additional Screening:  Vision Screening: Recommended annual ophthalmology exams for early detection of glaucoma and other disorders of the eye. Would you like a referral to an eye doctor? No    Dental Screening: Recommended annual dental exams for proper oral hygiene  Community Resource Referral / Chronic Care Management: CRR required this visit?  No   CCM required this visit?  No   Plan:    I have personally reviewed and noted the following in the patient's chart:   Medical and social history Use of alcohol, tobacco or illicit drugs  Current medications and supplements including opioid prescriptions. Patient is not currently taking opioid prescriptions. Functional ability and status Nutritional  status Physical activity Advanced directives List of other physicians Hospitalizations, surgeries, and ER visits in previous 12 months Vitals Screenings to include cognitive, depression, and falls Referrals and appointments  In addition, I have reviewed and discussed with patient certain preventive protocols, quality metrics, and best practice recommendations. A written personalized care plan for preventive services as well as general preventive health recommendations were provided to patient.   Lyle MARLA Right, [place]   01/26/2024   After Visit Summary: (MyChart) Due to this being a telephonic visit, the after visit summary with patients personalized plan was offered to patient via MyChart   Notes: Nothing significant to report at this time.

## 2024-02-09 ENCOUNTER — Other Ambulatory Visit: Payer: Self-pay | Admitting: Family

## 2024-02-09 DIAGNOSIS — E1169 Type 2 diabetes mellitus with other specified complication: Secondary | ICD-10-CM

## 2024-02-09 NOTE — Telephone Encounter (Signed)
 [facility] full, sent mychart to schedule a follow up in July

## 2024-02-09 NOTE — Telephone Encounter (Signed)
 Patient is due for Diabetes f/u in July. Please schedule.

## 2024-02-11 DIAGNOSIS — H2513 Age-related nuclear cataract, bilateral: Secondary | ICD-10-CM | POA: Diagnosis not present

## 2024-02-11 DIAGNOSIS — E119 Type 2 diabetes mellitus without complications: Secondary | ICD-10-CM | POA: Diagnosis not present

## 2024-02-11 LAB — HM DIABETES EYE EXAM

## 2024-02-16 ENCOUNTER — Ambulatory Visit (INDEPENDENT_AMBULATORY_CARE_PROVIDER_SITE_OTHER): Admitting: Podiatry

## 2024-02-16 ENCOUNTER — Ambulatory Visit: Payer: Self-pay | Admitting: Family

## 2024-02-16 ENCOUNTER — Encounter: Payer: Self-pay | Admitting: Podiatry

## 2024-02-16 ENCOUNTER — Ambulatory Visit: Admitting: Family

## 2024-02-16 DIAGNOSIS — E1122 Type 2 diabetes mellitus with diabetic chronic kidney disease: Secondary | ICD-10-CM

## 2024-02-16 DIAGNOSIS — B351 Tinea unguium: Secondary | ICD-10-CM

## 2024-02-16 DIAGNOSIS — M79674 Pain in right toe(s): Secondary | ICD-10-CM

## 2024-02-16 DIAGNOSIS — N182 Chronic kidney disease, stage 2 (mild): Secondary | ICD-10-CM

## 2024-02-16 DIAGNOSIS — M79675 Pain in left toe(s): Secondary | ICD-10-CM | POA: Diagnosis not present

## 2024-02-16 NOTE — Progress Notes (Signed)
  Subjective:  Patient ID: Brenner Visconti, male    DOB: 1955/11/09,  MRN: [id number]  68 y.o. male presents to clinic with  preventative diabetic foot care and painful thick toenails that are difficult to trim. Pain interferes with ambulation. Aggravating factors include wearing enclosed shoe gear. Pain is relieved with periodic professional debridement.  Chief Complaint  Patient presents with   Nail Problem    Thick painful toenails, 3 month follow up - diabetic     New problem(s): None   PCP is Corwin Antu, FNP.  Allergies  Allergen Reactions   Lisinopril  Swelling   Statins     Severe myopathy, in wheel chair Severe myopathy, in wheel chair    Review of Systems: Negative except as noted in the HPI.   Objective:  Namari Breton is a pleasant 68 y.o. male in NAD. AAO x 3.  Vascular Examination: Vascular status intact b/l with palpable pedal pulses. CFT immediate b/l. No edema. No pain with calf compression b/l. Skin temperature gradient WNL b/l. No edema noted b/l LE.  Neurological Examination: Sensation grossly intact b/l with 10 gram monofilament. Vibratory sensation intact b/l.   Dermatological Examination: Pedal skin warm and supple b/l.  No open wounds b/l. No interdigital macerations. Toenails 1-5 b/l elongated, thickened, discolored with subungual debris. +Tenderness with dorsal palpation of nailplates. No hyperkeratotic nor porokeratotic lesions noted b/l.  Musculoskeletal Examination: Muscle strength 5/5 to b/l LE. No pain, crepitus or joint limitation noted with ROM bilateral LE. Pes planus deformity noted bilateral LE.  Radiographs: None  Last A1c:      Latest Ref Rng & Units 08/18/2023    9:18 AM  Hemoglobin A1C  Hemoglobin-A1c 4.6 - 6.5 % 6.4      Assessment:   1. Pain due to onychomycosis of toenails of both feet    Plan:  Consent given for treatment. Patient examined. All patient's and/or POA's questions/concerns addressed on today's visit. Mycotic  toenails 1-5 debrided in length and girth without incident. Continue foot and shoe inspections daily. Monitor blood glucose per PCP/Endocrinologist's recommendations.Continue soft, supportive shoe gear daily. Report any pedal injuries to medical professional. Call office if there are any quesitons/concerns. -Patient/POA to call should there be question/concern in the interim.  Return in about 3 months (around 05/18/2024).  Delon LITTIE Merlin, DPM      [place] LOCATION: [address], [place] [address]                   Office [phone]   [facility] LOCATION: [address] [place], [place] [address] Office [phone]

## 2024-02-16 NOTE — Telephone Encounter (Signed)
 Pt sch appt with e2c2 for 02/24/24

## 2024-02-16 NOTE — Progress Notes (Signed)
 noted

## 2024-02-24 ENCOUNTER — Encounter: Payer: Self-pay | Admitting: Family

## 2024-02-24 ENCOUNTER — Ambulatory Visit (INDEPENDENT_AMBULATORY_CARE_PROVIDER_SITE_OTHER): Admitting: Family

## 2024-02-24 VITALS — BP 134/74 | HR 92 | Temp 98.5°F | Ht 71.73 in | Wt 249.8 lb

## 2024-02-24 DIAGNOSIS — E1169 Type 2 diabetes mellitus with other specified complication: Secondary | ICD-10-CM | POA: Diagnosis not present

## 2024-02-24 DIAGNOSIS — N182 Chronic kidney disease, stage 2 (mild): Secondary | ICD-10-CM | POA: Diagnosis not present

## 2024-02-24 DIAGNOSIS — R809 Proteinuria, unspecified: Secondary | ICD-10-CM

## 2024-02-24 DIAGNOSIS — E1122 Type 2 diabetes mellitus with diabetic chronic kidney disease: Secondary | ICD-10-CM | POA: Diagnosis not present

## 2024-02-24 DIAGNOSIS — D509 Iron deficiency anemia, unspecified: Secondary | ICD-10-CM | POA: Diagnosis not present

## 2024-02-24 DIAGNOSIS — Z7984 Long term (current) use of oral hypoglycemic drugs: Secondary | ICD-10-CM

## 2024-02-24 DIAGNOSIS — E782 Mixed hyperlipidemia: Secondary | ICD-10-CM | POA: Diagnosis not present

## 2024-02-24 DIAGNOSIS — I10 Essential (primary) hypertension: Secondary | ICD-10-CM | POA: Diagnosis not present

## 2024-02-24 LAB — COMPREHENSIVE METABOLIC PANEL WITH GFR
ALT: 19 U/L (ref 0–53)
AST: 21 U/L (ref 0–37)
Albumin: 4.2 g/dL (ref 3.5–5.2)
Alkaline Phosphatase: 44 U/L (ref 39–117)
BUN: 12 mg/dL (ref 6–23)
CO2: 26 meq/L (ref 19–32)
Calcium: 9.5 mg/dL (ref 8.4–10.5)
Chloride: 103 meq/L (ref 96–112)
Creatinine, Ser: 0.8 mg/dL (ref 0.40–1.50)
GFR: 89.85 mL/min (ref >60.00)
Glucose, Bld: 133 mg/dL — ABNORMAL HIGH (ref 70–99)
Potassium: 4.1 meq/L (ref 3.5–5.1)
Sodium: 141 meq/L (ref 135–145)
Total Bilirubin: 0.5 mg/dL (ref 0.2–1.2)
Total Protein: 6.8 g/dL (ref 6.0–8.3)

## 2024-02-24 LAB — MICROALBUMIN / CREATININE URINE RATIO
Creatinine,U: 39.9 mg/dL
Microalb Creat Ratio: 593.7 mg/g — ABNORMAL HIGH (ref 0.0–30.0)
Microalb, Ur: 23.7 mg/dL — ABNORMAL HIGH (ref 0.0–1.9)

## 2024-02-24 LAB — CBC
HCT: 40.6 % (ref 39.0–52.0)
Hemoglobin: 13.6 g/dL (ref 13.0–17.0)
MCHC: 33.5 g/dL (ref 30.0–36.0)
MCV: 92.5 fl (ref 78.0–100.0)
Platelets: 159 K/uL (ref 150.0–400.0)
RBC: 4.39 Mil/uL (ref 4.22–5.81)
RDW: 14.6 % (ref 11.5–15.5)
WBC: 5.8 K/uL (ref 4.0–10.5)

## 2024-02-24 LAB — LIPID PANEL
Cholesterol: 174 mg/dL (ref 0–200)
HDL: 50.4 mg/dL (ref >39.00)
LDL Cholesterol: 98 mg/dL (ref 0–99)
NonHDL: 123.5
Total CHOL/HDL Ratio: 3
Triglycerides: 130 mg/dL (ref 0.0–149.0)
VLDL: 26 mg/dL (ref 0.0–40.0)

## 2024-02-24 LAB — IBC + FERRITIN
Ferritin: 24.1 ng/mL (ref 22.0–322.0)
Iron: 94 ug/dL (ref 42–165)
Saturation Ratios: 26.4 % (ref 20.0–50.0)
TIBC: 355.6 ug/dL (ref 250.0–450.0)
Transferrin: 254 mg/dL (ref 212.0–360.0)

## 2024-02-24 LAB — HEMOGLOBIN A1C: Hgb A1c MFr Bld: 6.1 % (ref 4.6–6.5)

## 2024-02-24 MED ORDER — METFORMIN HCL 1000 MG PO TABS: 1000.0000 mg | ORAL_TABLET | Freq: Two times a day (BID) | ORAL | 3 refills | Status: AC

## 2024-02-24 NOTE — Assessment & Plan Note (Signed)
 Stable Continue meds as prescribed abetolol 50 mg twice daily , amlodipine  10 mg once daily

## 2024-02-24 NOTE — Progress Notes (Signed)
 Established Patient Office Visit  Subjective:   Patient ID: Jeremy Andrews, male    DOB: March 20, 1956  Age: 68 y.o. MRN: [id number]  CC:  Chief Complaint  Patient presents with   Follow-up    Diabetes    HPI: Jeremy Andrews is a 68 y.o. male presenting on 02/24/2024 for Follow-up (Diabetes)  DM2: currently taking jardiance  25 mg one daily and metformin  500 mg 2 tablets twice daily. GFR 67 Lab Results  Component Value Date   HGBA1C 6.4 08/18/2023   HTN: Blood pressure stable on exam today. On labetolol 50 mg twice daily , amlodipine  10 mg once daily   Oncology: following with oncology psa has been stable. Was seen in June, is due for 6 month follow in December.   HLD: only taking rosuvastatin  10 mg once weekly because otherwise it can cause weakness.      ROS: Negative unless specifically indicated above in HPI.   Relevant past medical history reviewed and updated as indicated.   Allergies and medications reviewed and updated.   Current Outpatient Medications:    Alcohol Swabs (DROPSAFE ALCOHOL PREP) 70 % PADS, USE UP TO FOUR TIMES DAILY AS DIRECTED, Disp: 400 each, Rfl: 3   allopurinol  (ZYLOPRIM ) 100 MG tablet, Take 1 tablet (100 mg total) by mouth daily., Disp: 90 tablet, Rfl: 3   amLODipine  (NORVASC ) 10 MG tablet, TAKE 1 TABLET EVERY DAY, Disp: 90 tablet, Rfl: 3   celecoxib  (CELEBREX ) 200 MG capsule, TAKE 1 CAPSULE DAILY AS NEEDED FOR MILD PAIN OR MODERATE PAIN., Disp: 90 capsule, Rfl: 3   cetirizine  (ZYRTEC ) 10 MG tablet, Take 1 tablet (10 mg total) by mouth daily., Disp: 90 tablet, Rfl: 3   Ferrous Sulfate (IRON ) 325 (65 Fe) MG TABS, Take 1 tablet (325 mg total) by mouth daily., Disp: 30 tablet, Rfl: 3   JARDIANCE  25 MG TABS tablet, TAKE 1 TABLET EVERY DAY BEFORE BREAKFAST, Disp: 90 tablet, Rfl: 3   labetalol  (NORMODYNE ) 100 MG tablet, TAKE 1/2 TABLET TWICE DAILY, Disp: 90 tablet, Rfl: 3   metFORMIN  (GLUCOPHAGE ) 1000 MG tablet, Take 1 tablet (1,000 mg total) by mouth 2  (two) times daily with a meal., Disp: 180 tablet, Rfl: 3   omeprazole  (PRILOSEC) 20 MG capsule, TAKE 1 CAPSULE EVERY DAY, Disp: 90 capsule, Rfl: 1   polyethylene glycol (GOLYTELY) 236 g solution, Take by mouth., Disp: , Rfl:    rosuvastatin  (CRESTOR ) 10 MG tablet, TAKE 1 TABLET (10 MG TOTAL) BY MOUTH DAILY. (Patient taking differently: Take 10 mg by mouth daily. Patient is taking once a week), Disp: 90 tablet, Rfl: 3   TRUE METRIX BLOOD GLUCOSE TEST test strip, USE  UP  TO FOUR TIMES DAILY AS DIRECTED, Disp: 100 strip, Rfl: 1   TRUEplus Lancets 33G MISC, USE  UP  TO FOUR TIMES DAILY AS DIRECTED, Disp: 100 each, Rfl: 1  Allergies  Allergen Reactions   Lisinopril  Swelling   Statins     Severe myopathy, in wheel chair Severe myopathy, in wheel chair    Objective:   BP 134/74   Pulse 92   Temp 98.5 F (36.9 C) (Temporal)   Ht 5' 11.73 (1.822 m)   Wt 249 lb 12.8 oz (113.3 kg)   SpO2 96%   BMI 34.13 kg/m    Physical Exam Constitutional:      General: He is not in acute distress.    Appearance: Normal appearance. He is normal weight. He is not ill-appearing, toxic-appearing or diaphoretic.  Cardiovascular:  Rate and Rhythm: Normal rate and regular rhythm.  Pulmonary:     Effort: Pulmonary effort is normal.     Breath sounds: Normal breath sounds.  Musculoskeletal:        General: Normal range of motion.  Neurological:     General: No focal deficit present.     Mental Status: He is alert and oriented to person, place, and time. Mental status is at baseline.  Psychiatric:        Mood and Affect: Mood normal.        Behavior: Behavior normal.        Thought Content: Thought content normal.        Judgment: Judgment normal.     Assessment & Plan:  Essential hypertension Assessment & Plan: Stable Continue meds as prescribed abetolol 50 mg twice daily , amlodipine  10 mg once daily    CKD stage 2 due to type 2 diabetes mellitus (HCC) Assessment & Plan: Cmp ordered for  today     Type 2 diabetes mellitus with other specified complication, without long-term current use of insulin (HCC) Assessment & Plan: Continue metformin  1000 mg twice daily and jardiance  25 mg once daily  Urine m/a ordered for today   Orders: -     Hemoglobin A1c -     Comprehensive metabolic panel with GFR -     Microalbumin / creatinine urine ratio -     metFORMIN  HCl; Take 1 tablet (1,000 mg total) by mouth 2 (two) times daily with a meal.  Dispense: 180 tablet; Refill: 3  Mixed hyperlipidemia -     Lipid panel  Microcytic anemia -     CBC -     IBC + Ferritin  Positive for microalbuminuria Assessment & Plan: Repeat today       Follow up plan: Return in about 6 months (around 08/26/2024) for f/u CPE.  Ginger Patrick, FNP

## 2024-02-24 NOTE — Assessment & Plan Note (Signed)
 Continue metformin  1000 mg twice daily and jardiance  25 mg once daily  Urine m/a ordered for today

## 2024-02-24 NOTE — Assessment & Plan Note (Signed)
 Cmp ordered for today

## 2024-02-24 NOTE — Assessment & Plan Note (Signed)
 Repeat today

## 2024-02-25 ENCOUNTER — Ambulatory Visit: Payer: Self-pay | Admitting: Family

## 2024-02-25 NOTE — Progress Notes (Signed)
 Let me know if you need a referral and I will place.  This pt is a diabetic with HTN and microalbuminuria that keeps increasing.  Lisinopril  caused swelling, so ACE and ARB are not great options, on jardiance .  Anything else I can add that is renal protective?

## 2024-02-27 ENCOUNTER — Encounter: Payer: Self-pay | Admitting: Pharmacist

## 2024-02-27 NOTE — Progress Notes (Addendum)
 Chart Review Documentation Medication Consideration: UACR management   Summary: Patient with DM and HTN and steadily rising microalbuminuria. 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  Already on jardiance  though UACR worsening.  Anything else I can add that is renal protective?  PMH: HTN, T2DM c/b retinopathy, CKD, HLD, gout  Cardiorenal Risk Reduction: History of clinical ASCVD? no The 10-year ASCVD risk score (Arnett DK, et al., 2019) is: 34.2% History of heart failure? no  History of hyperlipidemia? yes Taking SGLT-2i? yes Taking GLP- 1 RA? no   Considerations: Renal Disease Staging: CKD G2A3 Macroalbuminuria with last UACR 593.7 mg/g, worsened from previous,  344 despite SGLT2i.  Nephrology referral indicated for macroalbuminuria: placed ~January. Nephrology team noted non-urgent  Medication Management: RAASI: Hx angioedema on ACEi. SGLT2i: On Jardiance  Finerenone: Indicated Renal dose reduction: N/A, GFR >60 Potassium 4.1 (02/24/24) GFR 89.85 (02/24/24) DDI (strong CYP 3A4i): N/A  Further treatment of macroalbuminuria warranted at this time in the setting of diabetic kidney disease for reduction in CV morbidity/mortality risk.    Finerenone is the preferred next step in treatment of his kidney disease. Per FIGARO-DKD trial, patients with CKD1-2 received significant CV benefit with finerenone when baseline UACR was >300 or CKD3+ with any degree of albuminuria.    Finerenone start permitted if K <4.8 mEq/L. Potassium WNL on recent labs.  No concerning drug interaction present between current medication list and Finerenone.  Finerenone starting dose: 20 mg once daily REQUIRED repeat BMP (Scr, K) 4 weeks after initiation CONTINUE at 20 mg/d if K is 5.5 or less at week 4 HOLD (temporarily) if K >5.5  Follow Up: Patient given direct line for further questions/concerns.  Jeremy Andrews, PharmD Clinical Pharmacist [facility] [phone]

## 2024-02-29 ENCOUNTER — Telehealth: Payer: Self-pay | Admitting: Family

## 2024-02-29 ENCOUNTER — Other Ambulatory Visit: Payer: Self-pay | Admitting: Family

## 2024-02-29 DIAGNOSIS — Z79899 Other long term (current) drug therapy: Secondary | ICD-10-CM

## 2024-02-29 DIAGNOSIS — R809 Proteinuria, unspecified: Secondary | ICD-10-CM

## 2024-02-29 MED ORDER — FINERENONE 20 MG PO TABS: 20.0000 mg | ORAL_TABLET | Freq: Every day | ORAL | 0 refills | Status: DC

## 2024-02-29 NOTE — Telephone Encounter (Signed)
 Thank you lindsay.  I would like your ongoing help at least in the beginning for monitoring as this is a new medication for me.   [facility] [place] pool  Please advise pt that we are starting a new medication for his kidney status to provide it further protection. He will start at 20 mg once daily. Have him schedule a lab only appt x 4 weeks to repeat a lab level as we have to watch the potassium.

## 2024-03-01 ENCOUNTER — Telehealth: Payer: Self-pay

## 2024-03-01 ENCOUNTER — Encounter: Payer: Self-pay | Admitting: Pharmacist

## 2024-03-01 ENCOUNTER — Other Ambulatory Visit (HOSPITAL_COMMUNITY): Payer: Self-pay

## 2024-03-01 NOTE — Telephone Encounter (Signed)
 Pharmacy Patient Advocate Encounter   Received notification from CoverMyMeds that prior authorization for Kerendia  20MG  tablets is required/requested.   Insurance verification completed.   The patient is insured through [employer] .   Per test claim: PA required; PA submitted to above mentioned insurance via CoverMyMeds Key/confirmation #/EOC  [id number] Status is pending

## 2024-03-01 NOTE — Telephone Encounter (Signed)
 Spoke with pt and he is aware of this new medication. Lab only appt has been scheduled for 03/29/24 at 0815.

## 2024-03-01 NOTE — Telephone Encounter (Signed)
 Pharmacy Patient Advocate Encounter  Received notification from [employer] that Prior Authorization for Kerendia  20MG  tablets  has been APPROVED from 08/06/23 to 08/04/24   PA #/Case ID/Reference #: [id number]

## 2024-03-02 ENCOUNTER — Other Ambulatory Visit: Payer: Self-pay | Admitting: Pharmacist

## 2024-03-02 NOTE — Progress Notes (Signed)
 Brief Telephone Documentation Reason for Call: Called patient to review new prescription  Summary of Call: Called patient to ensure finerenone  was covered/affordable   He states that the pharmacy is ordering this and it will take 1-2 days to arrive.   He notes his other brand medications (Jardiance ) are $0/month through his insurance.  Follow Up: Patient given direct line for further questions/concerns.  Manuelita FABIENE Kobs, PharmD Clinical Pharmacist [facility] [phone]

## 2024-03-03 ENCOUNTER — Other Ambulatory Visit: Payer: Self-pay | Admitting: Family

## 2024-03-26 ENCOUNTER — Other Ambulatory Visit: Payer: Self-pay | Admitting: Family

## 2024-03-27 ENCOUNTER — Other Ambulatory Visit: Payer: Self-pay | Admitting: Family

## 2024-03-29 ENCOUNTER — Other Ambulatory Visit (INDEPENDENT_AMBULATORY_CARE_PROVIDER_SITE_OTHER)

## 2024-03-29 DIAGNOSIS — Z79899 Other long term (current) drug therapy: Secondary | ICD-10-CM | POA: Diagnosis not present

## 2024-03-29 LAB — BASIC METABOLIC PANEL WITH GFR
BUN: 13 mg/dL (ref 6–23)
CO2: 26 meq/L (ref 19–32)
Calcium: 9.3 mg/dL (ref 8.4–10.5)
Chloride: 101 meq/L (ref 96–112)
Creatinine, Ser: 1.11 mg/dL (ref 0.40–1.50)
GFR: 67.38 mL/min (ref >60.00)
Glucose, Bld: 160 mg/dL — ABNORMAL HIGH (ref 70–99)
Potassium: 4.1 meq/L (ref 3.5–5.1)
Sodium: 141 meq/L (ref 135–145)

## 2024-03-30 ENCOUNTER — Other Ambulatory Visit

## 2024-03-31 ENCOUNTER — Ambulatory Visit: Payer: Self-pay | Admitting: Family

## 2024-04-30 ENCOUNTER — Other Ambulatory Visit: Payer: Self-pay | Admitting: Family

## 2024-05-10 DIAGNOSIS — M1000 Idiopathic gout, unspecified site: Secondary | ICD-10-CM | POA: Diagnosis not present

## 2024-05-20 ENCOUNTER — Ambulatory Visit (INDEPENDENT_AMBULATORY_CARE_PROVIDER_SITE_OTHER): Admitting: Podiatry

## 2024-05-20 ENCOUNTER — Encounter: Payer: Self-pay | Admitting: Podiatry

## 2024-05-20 DIAGNOSIS — M79675 Pain in left toe(s): Secondary | ICD-10-CM

## 2024-05-20 DIAGNOSIS — B351 Tinea unguium: Secondary | ICD-10-CM

## 2024-05-20 DIAGNOSIS — N182 Chronic kidney disease, stage 2 (mild): Secondary | ICD-10-CM | POA: Diagnosis not present

## 2024-05-20 DIAGNOSIS — E1122 Type 2 diabetes mellitus with diabetic chronic kidney disease: Secondary | ICD-10-CM | POA: Diagnosis not present

## 2024-05-20 DIAGNOSIS — M79674 Pain in right toe(s): Secondary | ICD-10-CM

## 2024-05-24 NOTE — Progress Notes (Signed)
  Subjective:  Patient ID: Jeremy Andrews, male    DOB: 10/10/1955,  MRN: [id number]  68 y.o. male presents to clinic with  painful thick toenails that are difficult to trim. Pain interferes with ambulation. Aggravating factors include wearing enclosed shoe gear. Pain is relieved with periodic professional debridement.  Chief Complaint  Patient presents with   Toe Pain     Diabetic foot care. A1c is 6.1. NP Corwin is his PCP. Last visit was in July 2025. He doe not have an updated medication list with him.     New problem(s): None   PCP is Corwin Antu, FNP.  Allergies  Allergen Reactions   Lisinopril  Swelling    Angioedema   Statins     Severe myopathy, in wheel chair Severe myopathy, in wheel chair    Review of Systems: Negative except as noted in the HPI.   Objective:  Jeremy Andrews is a pleasant 68 y.o. male in NAD. AAO x 3.  Vascular Examination: Vascular status intact b/l with palpable pedal pulses. Pedal hair sparse. CFT immediate b/l. No edema. No pain with calf compression b/l. Skin temperature gradient WNL b/l. No ischemia or gangrene noted b/l LE. No cyanosis or clubbing noted b/l LE.  Neurological Examination: Sensation grossly intact b/l with 10 gram monofilament. Vibratory sensation intact b/l.   Dermatological Examination: Pedal skin with normal turgor, texture and tone b/l. Toenails 1-5 b/l thick, discolored, elongated with subungual debris and pain on dorsal palpation. No hyperkeratotic lesions noted b/l.   Musculoskeletal Examination: Muscle strength 5/5 to b/l LE. No pain, crepitus or joint limitation noted with ROM bilateral LE. Pes planus deformity noted bilateral LE. Patient ambulates independent of any assistive aids.  Radiographs: None  Last A1c:      Latest Ref Rng & Units 02/24/2024    9:43 AM 08/18/2023    9:18 AM  Hemoglobin A1C  Hemoglobin-A1c 4.6 - 6.5 % 6.1  6.4    Assessment:   1. Pain due to onychomycosis of toenails of both feet   2.  CKD stage 2 due to type 2 diabetes mellitus (HCC)    Plan:  Patient was evaluated and treated. All patient's and/or POA's questions/concerns addressed on today's visit. Toenails 1-5 b/l debrided in length and girth without incident. Continue foot and shoe inspections daily. Monitor blood glucose per PCP/Endocrinologist's recommendations. Continue soft, supportive shoe gear daily. Report any pedal injuries to medical professional. Call office if there are any questions/concerns. -Patient/POA to call should there be question/concern in the interim.  Return in about 3 months (around 08/20/2024).  Delon LITTIE Merlin, DPM      [facility] LOCATION: [address], [place] [address]                   Office [phone]   [facility] LOCATION: [address] [place], [place] [address] Office [phone]

## 2024-05-28 ENCOUNTER — Other Ambulatory Visit: Payer: Self-pay | Admitting: Family

## 2024-05-28 DIAGNOSIS — R809 Proteinuria, unspecified: Secondary | ICD-10-CM

## 2024-06-03 DIAGNOSIS — Z91199 Patient's noncompliance with other medical treatment and regimen due to unspecified reason: Secondary | ICD-10-CM | POA: Diagnosis not present

## 2024-06-03 DIAGNOSIS — N189 Chronic kidney disease, unspecified: Secondary | ICD-10-CM | POA: Diagnosis not present

## 2024-06-03 DIAGNOSIS — Z8249 Family history of ischemic heart disease and other diseases of the circulatory system: Secondary | ICD-10-CM | POA: Diagnosis not present

## 2024-06-03 DIAGNOSIS — M109 Gout, unspecified: Secondary | ICD-10-CM | POA: Diagnosis not present

## 2024-06-03 DIAGNOSIS — K219 Gastro-esophageal reflux disease without esophagitis: Secondary | ICD-10-CM | POA: Diagnosis not present

## 2024-06-03 DIAGNOSIS — F1010 Alcohol abuse, uncomplicated: Secondary | ICD-10-CM | POA: Diagnosis not present

## 2024-06-03 DIAGNOSIS — M1990 Unspecified osteoarthritis, unspecified site: Secondary | ICD-10-CM | POA: Diagnosis not present

## 2024-06-03 DIAGNOSIS — I129 Hypertensive chronic kidney disease with stage 1 through stage 4 chronic kidney disease, or unspecified chronic kidney disease: Secondary | ICD-10-CM | POA: Diagnosis not present

## 2024-06-03 DIAGNOSIS — Z888 Allergy status to other drugs, medicaments and biological substances status: Secondary | ICD-10-CM | POA: Diagnosis not present

## 2024-06-03 DIAGNOSIS — E785 Hyperlipidemia, unspecified: Secondary | ICD-10-CM | POA: Diagnosis not present

## 2024-06-03 DIAGNOSIS — Z8546 Personal history of malignant neoplasm of prostate: Secondary | ICD-10-CM | POA: Diagnosis not present

## 2024-06-03 DIAGNOSIS — Z6836 Body mass index (BMI) 36.0-36.9, adult: Secondary | ICD-10-CM | POA: Diagnosis not present

## 2024-06-03 DIAGNOSIS — N529 Male erectile dysfunction, unspecified: Secondary | ICD-10-CM | POA: Diagnosis not present

## 2024-06-03 DIAGNOSIS — E1136 Type 2 diabetes mellitus with diabetic cataract: Secondary | ICD-10-CM | POA: Diagnosis not present

## 2024-06-03 DIAGNOSIS — E1122 Type 2 diabetes mellitus with diabetic chronic kidney disease: Secondary | ICD-10-CM | POA: Diagnosis not present

## 2024-06-03 DIAGNOSIS — E1165 Type 2 diabetes mellitus with hyperglycemia: Secondary | ICD-10-CM | POA: Diagnosis not present

## 2024-06-03 DIAGNOSIS — E11311 Type 2 diabetes mellitus with unspecified diabetic retinopathy with macular edema: Secondary | ICD-10-CM | POA: Diagnosis not present

## 2024-07-07 ENCOUNTER — Other Ambulatory Visit: Payer: Self-pay | Admitting: *Deleted

## 2024-07-07 DIAGNOSIS — C61 Malignant neoplasm of prostate: Secondary | ICD-10-CM

## 2024-07-08 NOTE — Telephone Encounter (Signed)
 Colchicine refill (90 day supply request) Last ov: 06/04/2024 Next ov: Visit date not found

## 2024-07-13 ENCOUNTER — Encounter: Payer: Self-pay | Admitting: Pharmacist

## 2024-07-13 NOTE — Progress Notes (Signed)
 Chart Review Reason: Drug monitoring - Kerendia   Summary: Last PCP visit with follow up plan  Follow up plan: Return in about 6 months (around 08/26/2024) for f/u CPE.   Ideally due for repeat BMP to ensure Scr stable s/p Kerendia  start. Messaged scheduling pool to ensure PCP f/u is scheduled as intended in Jan.

## 2024-07-14 ENCOUNTER — Inpatient Hospital Stay: Attending: Radiation Oncology

## 2024-07-14 DIAGNOSIS — C61 Malignant neoplasm of prostate: Secondary | ICD-10-CM

## 2024-07-14 LAB — PSA: Prostatic Specific Antigen: 0.13 ng/mL (ref 0.00–4.00)

## 2024-07-19 ENCOUNTER — Other Ambulatory Visit: Payer: Self-pay | Admitting: Family

## 2024-07-19 DIAGNOSIS — E785 Hyperlipidemia, unspecified: Secondary | ICD-10-CM

## 2024-07-22 ENCOUNTER — Ambulatory Visit
Admission: RE | Admit: 2024-07-22 | Discharge: 2024-07-22 | Disposition: A | Discharge status: Home / Self Care | Source: Ambulatory Visit | Attending: Radiation Oncology | Admitting: Radiation Oncology

## 2024-07-22 ENCOUNTER — Encounter: Payer: Self-pay | Admitting: Radiation Oncology

## 2024-07-22 ENCOUNTER — Other Ambulatory Visit: Payer: Self-pay | Admitting: *Deleted

## 2024-07-22 VITALS — BP 137/80 | HR 80 | Temp 97.9°F | Resp 16 | Wt 252.0 lb

## 2024-07-22 DIAGNOSIS — Z923 Personal history of irradiation: Secondary | ICD-10-CM | POA: Insufficient documentation

## 2024-07-22 DIAGNOSIS — C61 Malignant neoplasm of prostate: Secondary | ICD-10-CM | POA: Diagnosis present

## 2024-07-22 NOTE — Progress Notes (Signed)
 Radiation Oncology Follow up Note  Name: Jeremy Andrews   Date:   07/22/2024 MRN:  [id number] DOB: 07-02-1956    This 68 y.o. 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  REFERRING PROVIDER: Corwin Antu, FNP  HPI: Patient is a 68 year old male now out 22 months having completed IMRT radiation therapy to his prostate for Gleason 7 (3+4) adenocarcinoma the prostate.  He is seen today in routine follow-up and is doing well.  He specifically denies any increased lower urinary tract symptoms diarrhea or fatigue.  His most recent PSA is.  0.13 down slightly from 6 months ago of 0.17.  COMPLICATIONS OF TREATMENT: none  FOLLOW UP COMPLIANCE: keeps appointments   PHYSICAL EXAM:  BP 137/80   Pulse 80   Temp 97.9 F (36.6 C) (Tympanic)   Resp 16   Wt 252 lb (114.3 kg)   BMI 34.43 kg/m  Well-developed well-nourished patient in NAD. HEENT reveals PERLA, EOMI, discs not visualized.  Oral cavity is clear. No oral mucosal lesions are identified. Neck is clear without evidence of cervical or supraclavicular adenopathy. Lungs are clear to A&P. Cardiac examination is essentially unremarkable with regular rate and rhythm without murmur rub or thrill. Abdomen is benign with no organomegaly or masses noted. Motor sensory and DTR levels are equal and symmetric in the upper and lower extremities. Cranial nerves II through XII are grossly intact. Proprioception is intact. No peripheral adenopathy or edema is identified. No motor or sensory levels are noted. Crude visual fields are within normal range.  RADIOLOGY RESULTS: No current films to review  PLAN: The present time patient is doing well under excellent biochemical control of his prostate cancer and pleased with his overall progress.  I am going to turn follow-up care over to his PMD.  I be happy to reevaluate him in the future should  that be indicated.  Patient knows to call with any concerns.  I would like to take this opportunity to thank you for allowing me to participate in the care of your patient.SABRA Marcey Penton, MD

## 2024-08-19 ENCOUNTER — Encounter: Admitting: Family

## 2024-08-30 ENCOUNTER — Ambulatory Visit: Admitting: Podiatry

## 2024-08-30 ENCOUNTER — Other Ambulatory Visit: Payer: Self-pay | Admitting: Family

## 2024-08-30 DIAGNOSIS — R809 Proteinuria, unspecified: Secondary | ICD-10-CM

## 2024-09-03 ENCOUNTER — Other Ambulatory Visit: Payer: Self-pay | Admitting: Family

## 2024-09-03 DIAGNOSIS — I10 Essential (primary) hypertension: Secondary | ICD-10-CM

## 2024-10-28 ENCOUNTER — Ambulatory Visit: Admitting: Podiatry

## 2025-01-27 ENCOUNTER — Ambulatory Visit

## 2025-02-01 ENCOUNTER — Encounter: Admitting: Family

## 2025-07-14 ENCOUNTER — Inpatient Hospital Stay

## 2025-07-21 ENCOUNTER — Ambulatory Visit: Admitting: Radiation Oncology
